# Patient Record
Sex: Female | Born: 2000 | Race: White | Hispanic: No | Marital: Single | Smoking: Never smoker
Health system: Southern US, Community
[De-identification: ages and names within clinical notes are randomized; demographics above are authoritative.]

## PROBLEM LIST (undated history)

## (undated) DIAGNOSIS — N83209 Unspecified ovarian cyst, unspecified side: Secondary | ICD-10-CM

## (undated) DIAGNOSIS — J0391 Acute recurrent tonsillitis, unspecified: Secondary | ICD-10-CM

## (undated) DIAGNOSIS — F4310 Post-traumatic stress disorder, unspecified: Secondary | ICD-10-CM

## (undated) DIAGNOSIS — F32A Depression, unspecified: Secondary | ICD-10-CM

## (undated) DIAGNOSIS — O139 Gestational [pregnancy-induced] hypertension without significant proteinuria, unspecified trimester: Secondary | ICD-10-CM

## (undated) DIAGNOSIS — R569 Unspecified convulsions: Secondary | ICD-10-CM

## (undated) DIAGNOSIS — F329 Major depressive disorder, single episode, unspecified: Secondary | ICD-10-CM

## (undated) DIAGNOSIS — F419 Anxiety disorder, unspecified: Secondary | ICD-10-CM

## (undated) HISTORY — PX: TONSILLECTOMY: SUR1361

## (undated) HISTORY — DX: Post-traumatic stress disorder, unspecified: F43.10

## (undated) HISTORY — DX: Unspecified convulsions: R56.9

## (undated) HISTORY — DX: Anxiety disorder, unspecified: F41.9

## (undated) HISTORY — DX: Depression, unspecified: F32.A

---
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
## 2018-04-14 ENCOUNTER — Encounter (HOSPITAL_BASED_OUTPATIENT_CLINIC_OR_DEPARTMENT_OTHER): Payer: Self-pay | Admitting: Emergency Medicine

## 2018-04-14 ENCOUNTER — Emergency Department (HOSPITAL_BASED_OUTPATIENT_CLINIC_OR_DEPARTMENT_OTHER)
Admission: EM | Admit: 2018-04-14 | Discharge: 2018-04-14 | Disposition: A | Discharge status: Home / Self Care | Payer: Medicaid Other | Attending: Emergency Medicine | Admitting: Emergency Medicine

## 2018-04-14 ENCOUNTER — Other Ambulatory Visit: Payer: Self-pay

## 2018-04-14 DIAGNOSIS — R102 Pelvic and perineal pain: Secondary | ICD-10-CM | POA: Diagnosis not present

## 2018-04-14 DIAGNOSIS — T7421XA Adult sexual abuse, confirmed, initial encounter: Secondary | ICD-10-CM

## 2018-04-14 DIAGNOSIS — T7621XA Adult sexual abuse, suspected, initial encounter: Secondary | ICD-10-CM | POA: Diagnosis not present

## 2018-04-14 DIAGNOSIS — R51 Headache: Secondary | ICD-10-CM | POA: Diagnosis present

## 2018-04-14 LAB — PREGNANCY, URINE: PREG TEST UR: NEGATIVE

## 2018-04-14 LAB — URINALYSIS, ROUTINE W REFLEX MICROSCOPIC
BILIRUBIN URINE: NEGATIVE
Glucose, UA: NEGATIVE mg/dL
Ketones, ur: NEGATIVE mg/dL
Nitrite: POSITIVE — AB
PH: 5.5 (ref 5.0–8.0)
Protein, ur: NEGATIVE mg/dL
SPECIFIC GRAVITY, URINE: 1.02 (ref 1.005–1.030)

## 2018-04-14 LAB — URINALYSIS, MICROSCOPIC (REFLEX)

## 2018-04-14 NOTE — Discharge Instructions (Signed)
Substance Abuse Treatment Programs ° °Intensive Outpatient Programs °[facility]     °[address]      °[place], [place]                   °[phone]      ° °[facility] °[address] °[place], [place] °[phone] ° °[facility]     °(Inpatient and outpatient)     °[address]           °[phone]   ° °[facility] °[phone] (Suboxone and Methadone) ° °[address]      °[place], [place] [address]      °[phone]      ° °[address] °[place], [place] °852-3033 ° °[facility] (Outpatient/Inpatient, Chemical)    °(insurance only) [phone]      °       °[facility] (Groups & Residential) °[place], [place] °336-389-1413 ° °   °[facility]     °[address]     °[place], [place]      °[phone]      ° °[facility] (for caregivers and family) °[address] °[place], [place] °336-299-4655 ° ° ° ° ° °Residential Treatment Programs °[facility]      °[address], [place], [place] [address]  °[phone]      ° °[facility] °[address] [place], [place] [address] °[phone] ° °[facility]        °336-248-8914      ° °[facility] °1-800-659-3381 ° °[facility]             °[address]                                         °[place], [place]                                                °877-615-2722 or 336-784-9470                              ° °[facility] [place] °[address] °[place], [address] °[phone] ° °[facility]    °[address]      °[place], [place]     °[phone]      ° °[facility] °[address] °[place], [place] °336-285-9073 ° °[facility]   °[address]     °[place], [place] [address]     °[phone]      °Admissions: 8am-3pm M-F ° °[facility] °[address] °[place],  [place] °336-227-7417 ° °[facility]: Residential Program (90 Days)   °[place], [place]      °336-725-8389 or 800-758-6077    ° °[facility] °[place], [place] °(Walk in Hours over the weekend or by referral) ° °[facility] °[address], [place],  [address] °([phone]) [phone] ° °Crisis Mobile: [facility]:  1-877-626-1772 (for crisis response 24 hours a day) °[facility] Hotline:      1-800-256-2452 °Outpatient Psychiatry and Counseling ° °[facility]: Mobile Crisis   Management 24 hours:  1-877-626-1772 ° °[facility] sliding scale fee and walk in schedule: M-F 8am-12pm/1pm-3pm °[address]  °[place], [place] [address] °[phone] ° °[facility] °[address] °[place], [place] [address] °[phone] ° °[facility] ([facility] known [facility])- new patient walk-in appointments available Monday - Friday 8am -3pm.          °[address] °[place], [place] [address] °336-676-6840 or crisis line- 336-676-6905 ° °[facility]/ Intensive Outpatient Therapy Program °[address] °[place], [place] [address] °336-832-9804 ° °[facility]                  °Crisis Services      °[phone]      °[address]     °[place], [place] [address]                ° °[facility]   °[facility] °[phone] °[address] °[place], [place] [address] ° ° °[facility]          °[address]  °[place], [place] [address]       °[phone] ° °[facility] °[address] °[place], [place] [address] °336-292-1510 ° °[facility]    °[address]    °[place], [place] [address]     °[phone]      ° °Parish McKinney, MD     °[address]     °[place] [address]     °336-282-1251     °  °[facility] °[address] °[place] [address] ° °[facility]     °[address]     °[place], [place]      °336-542-2076      ° °[facility] °Shamsher Ahluwalia, MD °[address] °[place], [place] [address] °[phone] ° °[facility]     °[address]     °[place], [place] [address]     °336-274-1237      ° °[facility] °[address] °[place], [place] [address] °336-854-4450 °Resources for Temporary Residential Assistance/Crisis Centers ° °DAY CENTERS °[facility] °M-F 8am-3pm   °[address] [place], [place] [address]   336-332-0824 °Services include: laundry, barbering, support groups, case management, phone  & computer access, showers, AA/NA mtgs, mental health/substance abuse nurse, job skills class, disability information, VA assistance, spiritual classes, etc.  ° °HOMELESS SHELTERS ° °[facility]     °[facility]   °[address], [place]     °336.271.5959       °       °[facility] (women and children)       °[address] °[place], [place] [address] °336-275-0820 °[email or web address] for application and process °Application Required ° °[facility]   °[address]    °[place] [address]     °[phone]       °             °[facility] °[address] °, [place] [address] °336.273.5572 °336-235-0363(schedule application appt.) °Application Required ° °[facility] (women only)    °[address]     °[place],  [address]     °336-884-1039      °  Intake starts 6pm daily °Need valid ID, SSC, & Police report °[facility] [place] °[address] °[place], [place] °336-881-5420 °Application Required ° °[facility] (men only)     °[address]      °[place], [place]     °[phone]      ° °[facility] °(Pregnant women only) °[address] °[place], [place] °336-275-0206 ° °[facility]      °[address]      °[place], [place] [address]     °[phone]      °       °[facility] [place] [facility] °[address] °[place], [place] °336-723-1848 °90 day commitment/SA/Application process ° °[facility](men only)     °[address]     °[place], [place]     °[phone]       °Check-in at 7pm     °       °[facility] °[address] °[place], [place] [address] °336-248-6684 °Men/Women/Women and Children must be there by 7 pm ° °[facility] °[place], [place] °336-722-8721                ° °
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
## 2018-04-14 NOTE — ED Notes (Signed)
MD with pt  
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
## 2018-04-16 LAB — URINE CULTURE

## 2018-04-16 LAB — GC/CHLAMYDIA PROBE AMP (~~LOC~~) NOT AT ARMC
Chlamydia: POSITIVE — AB
NEISSERIA GONORRHEA: NEGATIVE

## 2018-04-17 ENCOUNTER — Emergency Department (HOSPITAL_BASED_OUTPATIENT_CLINIC_OR_DEPARTMENT_OTHER)
Admission: EM | Admit: 2018-04-17 | Discharge: 2018-04-17 | Disposition: A | Discharge status: Home / Self Care | Payer: Medicaid Other | Attending: Emergency Medicine | Admitting: Emergency Medicine

## 2018-04-17 ENCOUNTER — Other Ambulatory Visit: Payer: Self-pay

## 2018-04-17 ENCOUNTER — Telehealth: Payer: Self-pay | Admitting: Emergency Medicine

## 2018-04-17 ENCOUNTER — Encounter (HOSPITAL_BASED_OUTPATIENT_CLINIC_OR_DEPARTMENT_OTHER): Payer: Self-pay

## 2018-04-17 DIAGNOSIS — A749 Chlamydial infection, unspecified: Secondary | ICD-10-CM | POA: Diagnosis present

## 2018-04-17 DIAGNOSIS — Z79899 Other long term (current) drug therapy: Secondary | ICD-10-CM | POA: Insufficient documentation

## 2018-04-17 DIAGNOSIS — F909 Attention-deficit hyperactivity disorder, unspecified type: Secondary | ICD-10-CM | POA: Diagnosis not present

## 2018-04-17 HISTORY — DX: Unspecified ovarian cyst, unspecified side: N83.209

## 2018-04-17 LAB — WET PREP, GENITAL
Clue Cells Wet Prep HPF POC: NONE SEEN
SPERM: NONE SEEN
TRICH WET PREP: NONE SEEN
YEAST WET PREP: NONE SEEN

## 2018-04-17 LAB — PREGNANCY, URINE: PREG TEST UR: NEGATIVE

## 2018-04-17 MED ORDER — AZITHROMYCIN 250 MG PO TABS
1000.0000 mg | ORAL_TABLET | Freq: Once | ORAL | Status: AC
  Administered 2018-04-17: 1000 mg via ORAL
  Filled 2018-04-17: qty 4

## 2018-04-17 MED ORDER — CEFTRIAXONE SODIUM 250 MG IJ SOLR
250.0000 mg | Freq: Once | INTRAMUSCULAR | Status: AC
  Administered 2018-04-17: 250 mg via INTRAMUSCULAR
  Filled 2018-04-17: qty 250

## 2018-04-17 MED ORDER — LIDOCAINE HCL (PF) 1 % IJ SOLN
INTRAMUSCULAR | Status: AC
  Administered 2018-04-17: 0.9 mL
  Filled 2018-04-17: qty 5

## 2018-04-17 NOTE — ED Triage Notes (Signed)
Pt states she called for f/u to STD screening and advised she was pos for chlamydia-NAD-steady gait
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
## 2018-04-17 NOTE — Discharge Instructions (Signed)
You have been treated today for an STD.   The test results with take 2-3 days to return. If there is an abnormal result, you will be notified. If you do not hear anything, that means the results were negative. You can also log on MyChart to see the results.    Do not have sexual intercourse for the next 7 days.  Follow-up with your primary care doctor in 2-4 days. If you do not have a primary care doctor, you can use one listed in the paperwork.   Return to the Emergency Department for any fever, abdominal pain, difficulty breathing, nausea/vomiting or any other worsening or concerning symptoms.

## 2018-04-17 NOTE — Telephone Encounter (Signed)
Post ED Visit - Positive Culture Follow-up  Culture report reviewed by antimicrobial stewardship pharmacist:  []  Enzo Bi, Pharm.D. []  Celedonio Miyamoto, Pharm.D., BCPS AQ-ID []  Garvin Fila, Pharm.D., BCPS []  Georgina Pillion, Pharm.D., BCPS []  Bar [place]unn, [address].D., BCPS, AAHIVP []  Estella Husk, Pharm.D., BCPS, AAHIVP []  Lysle Pearl, PharmD, BCPS []  Phillips Climes, PharmD, BCPS []  Agapito Games, PharmD, BCPS []  Verlan Friends, PharmD  Positive urine culture Treated with none, asymptomatic, no further patient follow-up is required at this time.  Berle Mull 04/17/2018, 10:56 AM

## 2018-04-17 NOTE — ED Notes (Signed)
Pt endorsed pain with pelvic exam, has vaginal discharge. Pt has scratches to left inner thigh and bilateral knees.

## 2018-05-05 ENCOUNTER — Encounter (HOSPITAL_BASED_OUTPATIENT_CLINIC_OR_DEPARTMENT_OTHER): Payer: Self-pay | Admitting: *Deleted

## 2018-05-05 ENCOUNTER — Emergency Department (HOSPITAL_BASED_OUTPATIENT_CLINIC_OR_DEPARTMENT_OTHER)
Admission: EM | Admit: 2018-05-05 | Discharge: 2018-05-06 | Disposition: A | Discharge status: Home / Self Care | Payer: Medicaid Other | Attending: Emergency Medicine | Admitting: Emergency Medicine

## 2018-05-05 ENCOUNTER — Other Ambulatory Visit: Payer: Self-pay

## 2018-05-05 DIAGNOSIS — K529 Noninfective gastroenteritis and colitis, unspecified: Secondary | ICD-10-CM

## 2018-05-05 DIAGNOSIS — R509 Fever, unspecified: Secondary | ICD-10-CM | POA: Diagnosis present

## 2018-05-05 LAB — COMPREHENSIVE METABOLIC PANEL
ALBUMIN: 4.5 g/dL (ref 3.5–5.0)
ALT: 12 U/L (ref 0–44)
ANION GAP: 8 (ref 5–15)
AST: 21 U/L (ref 15–41)
Alkaline Phosphatase: 60 U/L (ref 38–126)
BILIRUBIN TOTAL: 1 mg/dL (ref 0.3–1.2)
BUN: 15 mg/dL (ref 6–20)
CHLORIDE: 107 mmol/L (ref 98–111)
CO2: 26 mmol/L (ref 22–32)
Calcium: 9.2 mg/dL (ref 8.9–10.3)
Creatinine, Ser: 0.73 mg/dL (ref 0.44–1.00)
GFR calc Af Amer: >60 mL/min (ref >60)
GLUCOSE: 104 mg/dL — AB (ref 70–99)
Potassium: 3.6 mmol/L (ref 3.5–5.1)
Sodium: 141 mmol/L (ref 135–145)
TOTAL PROTEIN: 7.3 g/dL (ref 6.5–8.1)

## 2018-05-05 LAB — LIPASE, BLOOD: LIPASE: 27 U/L (ref 11–51)

## 2018-05-05 LAB — URINALYSIS, ROUTINE W REFLEX MICROSCOPIC
GLUCOSE, UA: NEGATIVE mg/dL
Ketones, ur: NEGATIVE mg/dL
NITRITE: NEGATIVE
Protein, ur: NEGATIVE mg/dL
pH: 5.5 (ref 5.0–8.0)

## 2018-05-05 LAB — PREGNANCY, URINE: Preg Test, Ur: NEGATIVE

## 2018-05-05 LAB — CBC
HCT: 37.1 % (ref 36.0–46.0)
HEMOGLOBIN: 12.6 g/dL (ref 12.0–15.0)
MCH: 27.3 pg (ref 26.0–34.0)
MCHC: 34 g/dL (ref 30.0–36.0)
MCV: 80.3 fL (ref 78.0–100.0)
Platelets: 184 10*3/uL (ref 150–400)
RBC: 4.62 MIL/uL (ref 3.87–5.11)
RDW: 13.7 % (ref 11.5–15.5)
WBC: 5.8 10*3/uL (ref 4.0–10.5)

## 2018-05-05 LAB — URINALYSIS, MICROSCOPIC (REFLEX)

## 2018-05-05 MED ORDER — SODIUM CHLORIDE 0.9 % IV BOLUS
1000.0000 mL | Freq: Once | INTRAVENOUS | Status: AC
  Administered 2018-05-05: 1000 mL via INTRAVENOUS

## 2018-05-05 MED ORDER — KETOROLAC TROMETHAMINE 30 MG/ML IJ SOLN
30.0000 mg | Freq: Once | INTRAMUSCULAR | Status: AC
  Administered 2018-05-05: 30 mg via INTRAVENOUS
  Filled 2018-05-05: qty 1

## 2018-05-05 MED ORDER — ONDANSETRON HCL 4 MG/2ML IJ SOLN
4.0000 mg | Freq: Once | INTRAMUSCULAR | Status: AC
  Administered 2018-05-05: 4 mg via INTRAVENOUS
  Filled 2018-05-05: qty 2

## 2018-05-05 NOTE — ED Notes (Signed)
Pt took 325mg  tylenol PTA.

## 2018-05-05 NOTE — ED Triage Notes (Signed)
Pt reports she has had fever since Friday and started vomiting today

## 2018-05-05 NOTE — ED Provider Notes (Addendum)
[facility] EMERGENCY DEPARTMENT Provider Note   CSN: [id number] Arrival date & time: 05/05/18  2138     History   Chief Complaint Chief Complaint  Patient presents with  . Fever    HPI Angle Dirusso is a 17 y.o. female.  Patient is an 17 year old female with no significant past medical history.  She presents for evaluation of fever for the past 2 days, then diarrhea, nausea, and vomiting that started today.  All has been nonbloody and non-bilious.  She reports epigastric cramping and also states that her urine has been cloudy.  He denies any ill contacts.  The history is provided by the patient.  Fever   This is a new problem. The current episode started 2 days ago. The problem occurs constantly. The problem has been gradually worsening. Associated symptoms include diarrhea and vomiting. She has tried nothing for the symptoms.    Past Medical History:  Diagnosis Date  . Ovarian cyst   . Recurrent tonsillitis 09/2017   current antibiotic, will finish 09/25/2017    Patient Active Problem List   Diagnosis Date Noted  . History of dental surgery-wisdom teeth extraction 01/03/2015  . Facial abscess 01/02/2015  . Trismus   . Allergic rhinitis, seasonal 07/28/2014    Past Surgical History:  Procedure Laterality Date  . TONSILLECTOMY N/A 10/01/2017   Procedure: TONSILLECTOMY;  Surgeon: Serena Colonel, MD;  Location: [facility];  Service: ENT;  Laterality: N/A;  . WISDOM TOOTH EXTRACTION  10/2014     OB History   None      Home Medications    Prior to Admission medications   Medication Sig Start Date End Date Taking? Authorizing Provider  Norethindrone Acet-Ethinyl Est (JUNEL 1.5/30 PO) Take 1 tablet by mouth daily.    Yes [provider]    Family History Family History  Problem Relation Age of Onset  . Cancer Mother   . Cancer Maternal Aunt   . Cancer Maternal Uncle   . Cancer Maternal Grandmother   . Stroke Maternal  Grandmother   . Diabetes Maternal Grandmother     Social History Social History   Tobacco Use  . Smoking status: Never Smoker  . Smokeless tobacco: Never Used  Substance Use Topics  . Alcohol use: No  . Drug use: No     Allergies   Dexamethasone and Ciprofloxacin   Review of Systems Review of Systems  Constitutional: Positive for fever.  Gastrointestinal: Positive for diarrhea and vomiting.  All other systems reviewed and are negative.    Physical Exam Updated Vital Signs BP 122/63 (BP Location: Left Arm)   Pulse 91   Temp 98.7 F (37.1 C) (Oral)   Resp 16   Ht 5\' 2"  (1.575 m)   Wt 63 kg   LMP 04/08/2018   SpO2 100%   BMI 25.40 kg/m   Physical Exam  Constitutional: She is oriented to person, place, and time. She appears well-developed and well-nourished. No distress.  HENT:  Head: Normocephalic and atraumatic.  Mouth/Throat: Oropharynx is clear and moist.  Neck: Normal range of motion. Neck supple.  Cardiovascular: Normal rate and regular rhythm. Exam reveals no gallop and no friction rub.  No murmur heard. Pulmonary/Chest: Effort normal and breath sounds normal. No respiratory distress. She has no wheezes.  Abdominal: Soft. Bowel sounds are normal. She exhibits no distension. There is tenderness. There is no rebound and no guarding.  There is mild tenderness in the epigastric region.  Musculoskeletal:  Normal range of motion.  Neurological: She is alert and oriented to person, place, and time.  Skin: Skin is warm and dry. She is not diaphoretic.  Nursing note and vitals reviewed.    ED Treatments / Results  Labs (all labs ordered are listed, but only abnormal results are displayed) Labs Reviewed  COMPREHENSIVE METABOLIC PANEL - Abnormal; Notable for the following components:      Result Value   Glucose, Bld 104 (*)    All other components within normal limits  URINALYSIS, ROUTINE W REFLEX MICROSCOPIC - Abnormal; Notable for the following components:    APPearance CLOUDY (*)    Specific Gravity, Urine >1.030 (*)    Hgb urine dipstick TRACE (*)    Bilirubin Urine SMALL (*)    Leukocytes, UA TRACE (*)    All other components within normal limits  URINALYSIS, MICROSCOPIC (REFLEX) - Abnormal; Notable for the following components:   Bacteria, UA MANY (*)    All other components within normal limits  LIPASE, BLOOD  CBC  PREGNANCY, URINE    EKG EKG Interpretation  Date/Time:  Sunday May 05 2018 22:07:32 EDT Ventricular Rate:  95 PR Interval:  128 QRS Duration: 88 QT Interval:  322 QTC Calculation: 405 R Axis:   70 Text Interpretation:  Sinus rhythm Nonspecific T wave abnormality in V2 and V3 Confirmed by Eber HongSpector, Z[place]ulon [phone]) on 05/06/2018 3:53:24 PM   Radiology No results found.  Procedures Procedures (including critical care time)  Medications Ordered in ED Medications  sodium chloride 0.9 % bolus 1,000 mL (has no administration in time range)  ondansetron (ZOFRAN) injection 4 mg (has no administration in time range)  ketorolac (TORADOL) 30 MG/ML injection 30 mg (has no administration in time range)     Initial Impression / Assessment and Plan / ED Course  I have reviewed the triage vital signs and the nursing notes.  Pertinent labs & imaging results that were available during my care of the patient were reviewed by me and considered in my medical decision making (see chart for details).  Patient symptoms likely viral in nature.  She will be discharged with Zofran and is to follow-up as needed.  Final Clinical Impressions(s) / ED Diagnoses   Final diagnoses:  None    ED Discharge Orders    None       Geoffery Lyonselo, Jacalynn Buzzell, MD 05/06/18 [id number]    Geoffery Lyonselo, Hafiz Irion, MD 05/24/18 [phone]

## 2018-05-06 MED ORDER — ONDANSETRON 8 MG PO TBDP: ORAL_TABLET | ORAL | 0 refills | Status: DC

## 2018-05-06 NOTE — Discharge Instructions (Addendum)
Zofran as prescribed as needed for nausea.  There are liquid diet as tolerated for the next 12 hours, then gradually advance as tolerated.  Return to the emergency department if symptoms significantly worsen or change.

## 2018-06-16 ENCOUNTER — Emergency Department (HOSPITAL_COMMUNITY): Payer: Medicaid Other

## 2018-06-16 ENCOUNTER — Encounter (HOSPITAL_COMMUNITY): Payer: Self-pay

## 2018-06-16 ENCOUNTER — Other Ambulatory Visit: Payer: Self-pay

## 2018-06-16 ENCOUNTER — Emergency Department (HOSPITAL_COMMUNITY)
Admission: EM | Admit: 2018-06-16 | Discharge: 2018-06-16 | Disposition: A | Discharge status: Home / Self Care | Payer: Medicaid Other | Attending: Emergency Medicine | Admitting: Emergency Medicine

## 2018-06-16 DIAGNOSIS — Z8742 Personal history of other diseases of the female genital tract: Secondary | ICD-10-CM | POA: Insufficient documentation

## 2018-06-16 DIAGNOSIS — R1084 Generalized abdominal pain: Secondary | ICD-10-CM

## 2018-06-16 DIAGNOSIS — R109 Unspecified abdominal pain: Secondary | ICD-10-CM | POA: Diagnosis present

## 2018-06-16 LAB — COMPREHENSIVE METABOLIC PANEL
ALT: 15 U/L (ref 0–44)
ANION GAP: 10 (ref 5–15)
AST: 16 U/L (ref 15–41)
Albumin: 4.9 g/dL (ref 3.5–5.0)
Alkaline Phosphatase: 63 U/L (ref 38–126)
BUN: 11 mg/dL (ref 6–20)
CHLORIDE: 106 mmol/L (ref 98–111)
CO2: 25 mmol/L (ref 22–32)
CREATININE: 0.68 mg/dL (ref 0.44–1.00)
Calcium: 9.4 mg/dL (ref 8.9–10.3)
Glucose, Bld: 85 mg/dL (ref 70–99)
POTASSIUM: 3.7 mmol/L (ref 3.5–5.1)
Sodium: 141 mmol/L (ref 135–145)
Total Bilirubin: 1.2 mg/dL (ref 0.3–1.2)
Total Protein: 7.9 g/dL (ref 6.5–8.1)

## 2018-06-16 LAB — CBC WITH DIFFERENTIAL/PLATELET
ABS IMMATURE GRANULOCYTES: 0.02 10*3/uL (ref 0.00–0.07)
BASOS ABS: 0 10*3/uL (ref 0.0–0.1)
BASOS PCT: 1 %
Eosinophils Absolute: 0.1 10*3/uL (ref 0.0–0.5)
Eosinophils Relative: 2 %
HCT: 42 % (ref 36.0–46.0)
Hemoglobin: 13.4 g/dL (ref 12.0–15.0)
Immature Granulocytes: 0 %
Lymphocytes Relative: 35 %
Lymphs Abs: 2.4 10*3/uL (ref 0.7–4.0)
MCH: 26.3 pg (ref 26.0–34.0)
MCHC: 31.9 g/dL (ref 30.0–36.0)
MCV: 82.5 fL (ref 80.0–100.0)
MONO ABS: 0.6 10*3/uL (ref 0.1–1.0)
Monocytes Relative: 8 %
NRBC: 0 % (ref 0.0–0.2)
Neutro Abs: 3.8 10*3/uL (ref 1.7–7.7)
Neutrophils Relative %: 54 %
PLATELETS: 258 10*3/uL (ref 150–400)
RBC: 5.09 MIL/uL (ref 3.87–5.11)
RDW: 14.1 % (ref 11.5–15.5)
WBC: 7 10*3/uL (ref 4.0–10.5)

## 2018-06-16 LAB — I-STAT BETA HCG BLOOD, ED (MC, WL, AP ONLY): I-stat hCG, quantitative: <5 m[IU]/mL (ref <5)

## 2018-06-16 LAB — LIPASE, BLOOD: LIPASE: 28 U/L (ref 11–51)

## 2018-06-16 MED ORDER — SODIUM CHLORIDE 0.9 % IV BOLUS
1000.0000 mL | Freq: Once | INTRAVENOUS | Status: AC
  Administered 2018-06-16: 1000 mL via INTRAVENOUS

## 2018-06-16 MED ORDER — ONDANSETRON HCL 4 MG/2ML IJ SOLN
4.0000 mg | Freq: Once | INTRAMUSCULAR | Status: AC
  Administered 2018-06-16: 4 mg via INTRAVENOUS
  Filled 2018-06-16: qty 2

## 2018-06-16 MED ORDER — MORPHINE SULFATE (PF) 4 MG/ML IV SOLN
4.0000 mg | Freq: Once | INTRAVENOUS | Status: AC
  Administered 2018-06-16: 4 mg via INTRAVENOUS
  Filled 2018-06-16: qty 1

## 2018-06-16 MED ORDER — IOPAMIDOL (ISOVUE-300) INJECTION 61%
INTRAVENOUS | Status: AC
  Filled 2018-06-16: qty 100

## 2018-06-16 MED ORDER — SODIUM CHLORIDE 0.9 % IJ SOLN
INTRAMUSCULAR | Status: AC
  Filled 2018-06-16: qty 50

## 2018-06-16 MED ORDER — IOPAMIDOL (ISOVUE-300) INJECTION 61%
100.0000 mL | Freq: Once | INTRAVENOUS | Status: AC | PRN
  Administered 2018-06-16: 100 mL via INTRAVENOUS

## 2018-06-16 NOTE — Discharge Instructions (Addendum)
Please take Ibuprofen (Advil, motrin) and Tylenol (acetaminophen) to relieve your pain.  You may take up to 600 MG (3 pills) of normal strength ibuprofen every 8 hours as needed.  In between doses of ibuprofen you make take tylenol, up to 1,000 mg (two extra strength pills).  Do not take more than 3,000 mg tylenol in a 24 hour period.  Please check all medication labels as many medications such as pain and cold medications may contain tylenol.  Do not drink alcohol while taking these medications.  Do not take other NSAID'S while taking ibuprofen (such as aleve or naproxen).  Please take ibuprofen with food to decrease stomach upset. ° °Today you received medications that may make you sleepy or impair your ability to make decisions.  For the next 24 hours please do not drive, operate heavy machinery, care for a small child with out another adult present, or perform any activities that may cause harm to you or someone else if you were to fall asleep or be impaired.  ° ° °

## 2018-06-16 NOTE — ED Notes (Signed)
Patient has extra blood in the lab one light green, one lavender, 2 golds

## 2018-06-16 NOTE — ED Notes (Signed)
Patient transported to CT 

## 2018-06-16 NOTE — ED Triage Notes (Signed)
PT reports "Im  having sharp pain 7/10 in her ovaries again" Pt reports that she was to have cysts removed in her ovaries in June but her mother did not take.

## 2018-06-16 NOTE — ED Notes (Signed)
ED Provider at bedside. 

## 2018-06-16 NOTE — ED Notes (Signed)
US at bedside

## 2018-06-16 NOTE — ED Provider Notes (Signed)
[facility]-EMERGENCY DEPT Provider Note   CSN: [id number] Arrival date & time: 06/16/18  1620     History   Chief Complaint Chief Complaint  Patient presents with  . Pelvic Pain    HPI Kristie Crawford is a 17 y.o. female with a past medical history of ovarian cyst who presents today for evaluation of abdominal pain.  She reports that over the past few days she has been having pain in her ovaries.  She says that she had a visit with her primary care doctor recently where she had STD testing and pelvic exam performed which was negative.  She declines pelvic exam at this time.  HPI  Past Medical History:  Diagnosis Date  . Ovarian cyst   . Recurrent tonsillitis 09/2017   current antibiotic, will finish 09/25/2017    Patient Active Problem List   Diagnosis Date Noted  . History of dental surgery-wisdom teeth extraction 01/03/2015  . Facial abscess 01/02/2015  . Trismus   . Allergic rhinitis, seasonal 07/28/2014    Past Surgical History:  Procedure Laterality Date  . TONSILLECTOMY N/A 10/01/2017   Procedure: TONSILLECTOMY;  Surgeon: Serena Colonel, MD;  Location: [facility];  Service: ENT;  Laterality: N/A;  . WISDOM TOOTH EXTRACTION  10/2014     OB History   None      Home Medications    Prior to Admission medications   Medication Sig Start Date End Date Taking? Authorizing Provider  acetaminophen (TYLENOL) 500 MG tablet Take 500 mg by mouth every 6 (six) hours as needed for mild pain.   Yes [provider]  ondansetron (ZOFRAN ODT) 8 MG disintegrating tablet 8mg  ODT q4 hours prn nausea Patient not taking: Reported on 06/16/2018 05/06/18   Geoffery Lyons, MD    Family History Family History  Problem Relation Age of Onset  . Cancer Mother   . Cancer Maternal Aunt   . Cancer Maternal Uncle   . Cancer Maternal Grandmother   . Stroke Maternal Grandmother   . Diabetes Maternal Grandmother     Social History Social  History   Tobacco Use  . Smoking status: Never Smoker  . Smokeless tobacco: Never Used  Substance Use Topics  . Alcohol use: No  . Drug use: No     Allergies   Dexamethasone and Ciprofloxacin   Review of Systems Review of Systems  Constitutional: Negative for chills and fever.  Respiratory: Negative for chest tightness and shortness of breath.   Gastrointestinal: Positive for abdominal pain and nausea. Negative for diarrhea and vomiting.  Genitourinary: Positive for pelvic pain. Negative for dysuria, frequency, hematuria, urgency, vaginal bleeding, vaginal discharge and vaginal pain.  Musculoskeletal: Negative for back pain and neck pain.  All other systems reviewed and are negative.    Physical Exam Updated Vital Signs BP 111/68 (BP Location: Right Arm)   Pulse 70   Temp 98.9 F (37.2 C) (Oral)   Resp 18   Ht 5\' 2"  (1.575 m)   Wt 63.5 kg   LMP 03/26/2018   SpO2 100%   BMI 25.61 kg/m   Physical Exam  Constitutional: She appears well-developed and well-nourished. No distress.  HENT:  Head: Normocephalic and atraumatic.  Mouth/Throat: Oropharynx is clear and moist.  Eyes: Conjunctivae are normal.  Neck: Normal range of motion. Neck supple.  Cardiovascular: Normal rate, regular rhythm and normal heart sounds.  No murmur heard. Pulmonary/Chest: Effort normal and breath sounds normal. No respiratory distress.  Abdominal: Soft.  Bowel sounds are normal. She exhibits no distension. There is tenderness in the right lower quadrant, suprapubic area and left lower quadrant. There is no guarding.  Genitourinary:  Genitourinary Comments: Patient refused  Musculoskeletal: She exhibits no edema.  Neurological: She is alert.  Skin: Skin is warm and dry.  Psychiatric: She has a normal mood and affect. Her behavior is normal.  Nursing note and vitals reviewed.    ED Treatments / Results  Labs (all labs ordered are listed, but only abnormal results are displayed) Labs  Reviewed  COMPREHENSIVE METABOLIC PANEL  LIPASE, BLOOD  CBC WITH DIFFERENTIAL/PLATELET  I-STAT BETA HCG BLOOD, ED ([facility], [facility], [facility] ONLY)    EKG None  Radiology US Transvaginal Non-ob  Result Date: 06/16/2018 CLINICAL DATA:  Left pelvic pain.  History of ovarian cysts. EXAM: TRANSABDOMINAL AND TRANSVAGINAL ULTRASOUND OF PELVIS DOPPLER ULTRASOUND OF OVARIES TECHNIQUE: Both transabdominal and transvaginal ultrasound examinations of the pelvis were performed. Transabdominal technique was performed for global imaging of the pelvis including uterus, ovaries, adnexal regions, and pelvic cul-de-sac. It was necessary to proceed with endovaginal exam following the transabdominal exam to visualize the endometrium and ovaries. Color and duplex Doppler ultrasound was utilized to evaluate blood flow to the ovaries. COMPARISON:  None. FINDINGS: Uterus Measurements: 6.8 x 2.9 x 4.0 cm = volume: 41 mL. No fibroids or other mass visualized. Endometrium Thickness: 6 mm.  No focal abnormality visualized. Right ovary Measurements: 3.5 x 2.6 x 2.5 cm = volume: 12 mL. Normal appearance/no adnexal mass. Left ovary Measurements: 3.2 x 2.3 x 2.8 cm = volume: 11 mL. Normal appearance/no adnexal mass. Pulsed Doppler evaluation of both ovaries demonstrates normal low-resistance arterial and venous waveforms. Other findings Trace free fluid in the pelvis. IMPRESSION: Negative pelvic ultrasound.  No evidence of ovarian torsion. Electronically Signed   By: Sebastian Ache M.D.   On: 06/16/2018 19:24   US Pelvis Complete  Result Date: 06/16/2018 CLINICAL DATA:  Left pelvic pain.  History of ovarian cysts. EXAM: TRANSABDOMINAL AND TRANSVAGINAL ULTRASOUND OF PELVIS DOPPLER ULTRASOUND OF OVARIES TECHNIQUE: Both transabdominal and transvaginal ultrasound examinations of the pelvis were performed. Transabdominal technique was performed for global imaging of the pelvis including uterus, ovaries, adnexal regions, and pelvic cul-de-sac. It was  necessary to proceed with endovaginal exam following the transabdominal exam to visualize the endometrium and ovaries. Color and duplex Doppler ultrasound was utilized to evaluate blood flow to the ovaries. COMPARISON:  None. FINDINGS: Uterus Measurements: 6.8 x 2.9 x 4.0 cm = volume: 41 mL. No fibroids or other mass visualized. Endometrium Thickness: 6 mm.  No focal abnormality visualized. Right ovary Measurements: 3.5 x 2.6 x 2.5 cm = volume: 12 mL. Normal appearance/no adnexal mass. Left ovary Measurements: 3.2 x 2.3 x 2.8 cm = volume: 11 mL. Normal appearance/no adnexal mass. Pulsed Doppler evaluation of both ovaries demonstrates normal low-resistance arterial and venous waveforms. Other findings Trace free fluid in the pelvis. IMPRESSION: Negative pelvic ultrasound.  No evidence of ovarian torsion. Electronically Signed   By: Sebastian Ache M.D.   On: 06/16/2018 19:24   Ct Abdomen Pelvis W Contrast  Result Date: 06/16/2018 CLINICAL DATA:  Sharp pain in the ovaries. History of ovarian cyst. Bilateral lower quadrant pain, right worse than left. EXAM: CT ABDOMEN AND PELVIS WITH CONTRAST TECHNIQUE: Multidetector CT imaging of the abdomen and pelvis was performed using the standard protocol following bolus administration of intravenous contrast. CONTRAST:  100 mL Isovue-300 COMPARISON:  Ultrasound pelvis 06/16/2018 FINDINGS: Lower chest: Mild dependent  changes in the lung bases. Hepatobiliary: No focal liver abnormality is seen. No gallstones, gallbladder wall thickening, or biliary dilatation. Pancreas: Unremarkable. No pancreatic ductal dilatation or surrounding inflammatory changes. Spleen: Normal in size without focal abnormality. Adrenals/Urinary Tract: Adrenal glands are unremarkable. Kidneys are normal, without renal calculi, focal lesion, or hydronephrosis. Bladder is unremarkable. Stomach/Bowel: Stomach is within normal limits. Appendix appears normal. No evidence of bowel wall thickening, distention, or  inflammatory changes. Vascular/Lymphatic: No significant vascular findings are present. No enlarged abdominal or pelvic lymph nodes. Reproductive: Uterus and bilateral adnexa are unremarkable. Other: No abdominal wall hernia or abnormality. No abdominopelvic ascites. Musculoskeletal: No acute or significant osseous findings. IMPRESSION: No acute process demonstrated in the abdomen or pelvis. No evidence of bowel obstruction or inflammation. Appendix is normal. Electronically Signed   By: Burman Nieves M.D.   On: 06/16/2018 21:14   [place] Art/ven Flow Abd Pelv Doppler  Result Date: 06/16/2018 CLINICAL DATA:  Left pelvic pain.  History of ovarian cysts. EXAM: TRANSABDOMINAL AND TRANSVAGINAL ULTRASOUND OF PELVIS DOPPLER ULTRASOUND OF OVARIES TECHNIQUE: Both transabdominal and transvaginal ultrasound examinations of the pelvis were performed. Transabdominal technique was performed for global imaging of the pelvis including uterus, ovaries, adnexal regions, and pelvic cul-de-sac. It was necessary to proceed with endovaginal exam following the transabdominal exam to visualize the endometrium and ovaries. Color and duplex Doppler ultrasound was utilized to evaluate blood flow to the ovaries. COMPARISON:  None. FINDINGS: Uterus Measurements: 6.8 x 2.9 x 4.0 cm = volume: 41 mL. No fibroids or other mass visualized. Endometrium Thickness: 6 mm.  No focal abnormality visualized. Right ovary Measurements: 3.5 x 2.6 x 2.5 cm = volume: 12 mL. Normal appearance/no adnexal mass. Left ovary Measurements: 3.2 x 2.3 x 2.8 cm = volume: 11 mL. Normal appearance/no adnexal mass. Pulsed Doppler evaluation of both ovaries demonstrates normal low-resistance arterial and venous waveforms. Other findings Trace free fluid in the pelvis. IMPRESSION: Negative pelvic ultrasound.  No evidence of ovarian torsion. Electronically Signed   By: Sebastian Ache M.D.   On: 06/16/2018 19:24    Procedures Procedures (including critical care  time)  Medications Ordered in ED Medications  sodium chloride 0.9 % injection (has no administration in time range)  morphine 4 MG/ML injection 4 mg (4 mg Intravenous Given 06/16/18 1926)  ondansetron (ZOFRAN) injection 4 mg (4 mg Intravenous Given 06/16/18 1925)  sodium chloride 0.9 % bolus 1,000 mL (0 mLs Intravenous Stopped 06/16/18 2038)  iopamidol (ISOVUE-300) 61 % injection 100 mL (100 mLs Intravenous Contrast Given 06/16/18 2034)     Initial Impression / Assessment and Plan / ED Course  I have reviewed the triage vital signs and the nursing notes.  Pertinent labs & imaging results that were available during my care of the patient were reviewed by me and considered in my medical decision making (see chart for details).    Patient presents today for evaluation of abdominal/pelvic pain.  She has a history of ovarian cyst.  Ultrasound was obtained and reviewed does not show any cysts on her ovaries.  She had tenderness in the bilateral lower abdominal quadrants, right greater than left.  CT scan abdomen pelvis was obtained which did not show any cause for her symptoms or other abnormalities.  She stated she did not need a UA, and refused pelvic exam.  Labs were obtained and reviewed without evidence of acute abnormalities.  She is hemodynamically stable while in the department.  Return precautions were discussed with patient who states their  understanding.  At the time of discharge patient denied any unaddressed complaints or concerns.  Patient is agreeable for discharge home.   Final Clinical Impressions(s) / ED Diagnoses   Final diagnoses:  Generalized abdominal pain    ED Discharge Orders    None       Norman Clay 06/17/18 0055    Terrilee Files, MD 06/17/18 [phone]

## 2018-06-29 ENCOUNTER — Other Ambulatory Visit: Payer: Self-pay

## 2018-06-29 ENCOUNTER — Encounter (HOSPITAL_BASED_OUTPATIENT_CLINIC_OR_DEPARTMENT_OTHER): Payer: Self-pay | Admitting: Emergency Medicine

## 2018-06-29 ENCOUNTER — Emergency Department (HOSPITAL_BASED_OUTPATIENT_CLINIC_OR_DEPARTMENT_OTHER): Payer: Medicaid Other

## 2018-06-29 ENCOUNTER — Emergency Department (HOSPITAL_BASED_OUTPATIENT_CLINIC_OR_DEPARTMENT_OTHER)
Admission: EM | Admit: 2018-06-29 | Discharge: 2018-06-29 | Disposition: A | Discharge status: Home / Self Care | Payer: Medicaid Other | Attending: Emergency Medicine | Admitting: Emergency Medicine

## 2018-06-29 DIAGNOSIS — Y939 Activity, unspecified: Secondary | ICD-10-CM | POA: Insufficient documentation

## 2018-06-29 DIAGNOSIS — S59902A Unspecified injury of left elbow, initial encounter: Secondary | ICD-10-CM | POA: Diagnosis present

## 2018-06-29 DIAGNOSIS — Y998 Other external cause status: Secondary | ICD-10-CM | POA: Insufficient documentation

## 2018-06-29 DIAGNOSIS — Y929 Unspecified place or not applicable: Secondary | ICD-10-CM | POA: Insufficient documentation

## 2018-06-29 DIAGNOSIS — W19XXXA Unspecified fall, initial encounter: Secondary | ICD-10-CM | POA: Insufficient documentation

## 2018-06-29 DIAGNOSIS — S5002XA Contusion of left elbow, initial encounter: Secondary | ICD-10-CM
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
## 2018-06-29 NOTE — ED Triage Notes (Signed)
Patient states that she is having pain to her right elbow after a fall  - the patient reports that she had numbness and tingling

## 2018-08-16 ENCOUNTER — Inpatient Hospital Stay (HOSPITAL_COMMUNITY)
Admission: AD | Admit: 2018-08-16 | Discharge: 2018-08-16 | Disposition: A | Discharge status: Home / Self Care | Payer: Medicaid Other | Source: Ambulatory Visit | Attending: Obstetrics and Gynecology | Admitting: Obstetrics and Gynecology

## 2018-08-16 ENCOUNTER — Encounter (HOSPITAL_COMMUNITY): Payer: Self-pay | Admitting: *Deleted

## 2018-08-16 ENCOUNTER — Other Ambulatory Visit: Payer: Self-pay

## 2018-08-16 DIAGNOSIS — N898 Other specified noninflammatory disorders of vagina: Secondary | ICD-10-CM

## 2018-08-16 DIAGNOSIS — R109 Unspecified abdominal pain: Secondary | ICD-10-CM | POA: Diagnosis present

## 2018-08-16 DIAGNOSIS — Z3202 Encounter for pregnancy test, result negative: Secondary | ICD-10-CM | POA: Diagnosis not present

## 2018-08-16 DIAGNOSIS — N3001 Acute cystitis with hematuria: Secondary | ICD-10-CM | POA: Insufficient documentation

## 2018-08-16 LAB — CBC
HEMATOCRIT: 37.7 % (ref 36.0–46.0)
Hemoglobin: 12.5 g/dL (ref 12.0–15.0)
MCH: 27.5 pg (ref 26.0–34.0)
MCHC: 33.2 g/dL (ref 30.0–36.0)
MCV: 83 fL (ref 80.0–100.0)
Platelets: 192 10*3/uL (ref 150–400)
RBC: 4.54 MIL/uL (ref 3.87–5.11)
RDW: 15.2 % (ref 11.5–15.5)
WBC: 6.7 10*3/uL (ref 4.0–10.5)
nRBC: 0 % (ref 0.0–0.2)

## 2018-08-16 LAB — URINALYSIS, ROUTINE W REFLEX MICROSCOPIC
BILIRUBIN URINE: NEGATIVE
Glucose, UA: NEGATIVE mg/dL
HGB URINE DIPSTICK: NEGATIVE
KETONES UR: NEGATIVE mg/dL
NITRITE: POSITIVE — AB
Protein, ur: NEGATIVE mg/dL
Specific Gravity, Urine: 1.019 (ref 1.005–1.030)
pH: 8 (ref 5.0–8.0)

## 2018-08-16 LAB — HCG, QUANTITATIVE, PREGNANCY: HCG, BETA CHAIN, QUANT, S: 1 m[IU]/mL (ref <5)

## 2018-08-16 LAB — POCT PREGNANCY, URINE: PREG TEST UR: NEGATIVE

## 2018-08-16 MED ORDER — NITROFURANTOIN MONOHYD MACRO 100 MG PO CAPS: 100.0000 mg | ORAL_CAPSULE | Freq: Two times a day (BID) | ORAL | 0 refills | Status: DC

## 2018-08-16 NOTE — Discharge Instructions (Signed)
[facility] [employer] [facility] [employer] at [facility]       Phone: [phone]  Center for [employer] at [place] Phone: [phone]  [facility] for [employer] at [place]  Phone: [phone]  [facility] for [employer] at [employer]  Phone: [phone]  Center for [facility] at [place]  Phone: [phone]  [facility]       Phone: [phone]  [facility]    Phone: [phone]   [facility] [place])    Phone: [phone]  [facility]    Phone: [phone]  [facility]    Phone: [phone]  [facility]    Phone: [phone]  [facility]       Phone: [phone]   [facility]  Phone: [phone]  [facility]    Phone: [phone]  [facility] [place] [facility]   Phone: [phone]  [facility]      Phone: [phone]  [facility]    Phone: [phone]    Home Pregnancy Test Information  A home pregnancy test helps you determine whether you are pregnant or not. There are several types of home pregnancy tests that can be bought at a grocery store or pharmacy. What is being tested? A home pregnancy test detects the presence of a hormone in your urine. The hormone is produced by cells of the placenta (human chorionic gonadotropin, or hCG). The placenta is the organ that forms to nourish and support a developing baby. How are pregnancy tests done? Home pregnancy tests require a urine sample.  Most kits use a plastic testing device with a strip of paper that indicates whether there is hCG in your urine.  Follow the test package instructions very carefully for how to test your urine. Depending on the test, you may need to: ? Urinate directly onto the  stick. ? Urinate into a cup.  Wait for the results as directed by the package instructions. The amount of time may be different for each type of test.  Follow the test package instructions for how to read your test results. Depending on the test, results may be displayed as: ? A plus or a minus sign. ? One or two lines. ? "Pregnant" or "not pregnant."  For best results, use your first urine of the morning. That is when the concentration of hCG is highest. How accurate are home pregnancy tests? Home pregnancy tests are very accurate when:  You are at least 3-[redacted] weeks pregnant.  It has been 1-2 weeks since your missed period.  You use the test according to the package instructions. What can interfere with home pregnancy test results? Sometimes, a home pregnancy test may report that you are pregnant when you are not pregnant (false-positive result). This can happen if you:  Are taking certain medicines, such as: ? Medicine to control seizures. ? Anti-anxiety medicine. ? Fertility medicine with hCG.  Have a medical condition that affects your hormone levels.  Had a recent pregnancy loss (miscarriage) or abortion. Sometimes, a home pregnancy test may report that you are not pregnant when you are pregnant (false-negative result). This can happen if you:  Took the test too early in your pregnancy. Before 3-4 weeks of pregnancy, there may not be enough hCG to detect.  Drank a lot of liquid before the test.  Used an expired pregnancy test.  Are taking certain medicines,  such as antihistamines or water pills (diuretics). What should I do if I have a positive pregnancy test? If you have a positive home pregnancy test, schedule an appointment with your health care provider. You might need additional testing to confirm the pregnancy. What should I do if I have a negative pregnancy test? If you have a negative home pregnancy test but still have symptoms of pregnancy, contact your health  care provider. Your health care provider will test a sample of your blood to check for pregnancy. In some cases, a blood test will return a positive result even if a urine test was negative because blood tests are more sensitive. This means blood tests can detect hCG earlier than home pregnancy tests. Follow these instructions at home: If you are pregnant, planning to become pregnant, or think you may be pregnant:  Do not drink alcohol.  Do not use street drugs.  Do not use any products that contain nicotine or tobacco, such as cigarettes and e-cigarettes. If you need help quitting, ask your health care provider.  Take a prenatal vitamin that contains at least 400 mcg of folic acid daily. Summary  A home pregnancy test helps you determine whether you are pregnant or not by detecting the presence of the hormone human chorionic gonadotropin (hCG) in a sample of your urine.  Follow the test package instructions very carefully. For best results, use your first urine of the morning. That is when the concentration of hCG is highest.  Home pregnancy tests are very accurate when you are 3-[redacted] weeks pregnant or when it has been 1-2 weeks since your missed period.  A home pregnancy test may report that you are pregnant when you are not pregnant or that you are not pregnant when you are pregnant.  Contact your health care provider to confirm your results. Your health care provider will test a sample of your blood to check for pregnancy. This information is not intended to replace advice given to you by your health care provider. Make sure you discuss any questions you have with your health care provider. Document Released: 08/03/2003 Document Revised: 08/13/2017 Document Reviewed: 08/13/2017 Elsevier Interactive Patient Education  2019 [employer].
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
## 2018-08-16 NOTE — MAU Provider Note (Signed)
History     CSN: [id number]  Arrival date and time: 08/16/18 1648   First Provider Initiated Contact with Patient 08/16/18 1924      Chief Complaint  Patient presents with  . Abdominal Pain  . Vaginal Pain  . Possible Pregnancy   HPI Kristie Crawford is a 18 y.o. non pregnant female who presents with vaginal discomfort. She reports an increase in thick white vaginal discharge. She reports a positive pregnancy test and a negative pregnancy test. She denies any pain or bleeding.   OB History   No obstetric history on file.     Past Medical History:  Diagnosis Date  . Ovarian cyst   . Recurrent tonsillitis 09/2017   current antibiotic, will finish 09/25/2017    Past Surgical History:  Procedure Laterality Date  . TONSILLECTOMY N/A 10/01/2017   Procedure: TONSILLECTOMY;  Surgeon: Serena Colonel, MD;  Location: [facility];  Service: ENT;  Laterality: N/A;  . WISDOM TOOTH EXTRACTION  10/2014    Family History  Problem Relation Age of Onset  . Cancer Mother   . Cancer Maternal Aunt   . Cancer Maternal Uncle   . Cancer Maternal Grandmother   . Stroke Maternal Grandmother   . Diabetes Maternal Grandmother     Social History   Tobacco Use  . Smoking status: Never Smoker  . Smokeless tobacco: Never Used  Substance Use Topics  . Alcohol use: No  . Drug use: No    Allergies:  Allergies  Allergen Reactions  . Dexamethasone Swelling  . Ciprofloxacin Rash    Medications Prior to Admission  Medication Sig Dispense Refill Last Dose  . acetaminophen (TYLENOL) 500 MG tablet Take 500 mg by mouth every 6 (six) hours as needed for mild pain.   More than a month at Unknown time  . ondansetron (ZOFRAN ODT) 8 MG disintegrating tablet 8mg  ODT q4 hours prn nausea (Patient not taking: Reported on 06/16/2018) 6 tablet 0 More than a month at Unknown time    Review of Systems  Constitutional: Negative.  Negative for fatigue and fever.  HENT: Negative.    Respiratory: Negative.  Negative for shortness of breath.   Cardiovascular: Negative.  Negative for chest pain.  Gastrointestinal: Positive for abdominal pain. Negative for constipation, diarrhea, nausea and vomiting.  Genitourinary: Positive for pelvic pain and vaginal discharge. Negative for dysuria.  Neurological: Negative.  Negative for dizziness and headaches.   Physical Exam   Blood pressure 120/67, pulse 81, temperature 98.6 F (37 C), temperature source Oral, resp. rate 16, weight 64.4 kg, last menstrual period 07/11/2018, SpO2 100 %.  Physical Exam  Nursing note and vitals reviewed. Constitutional: She is oriented to person, place, and time. She appears well-developed and well-nourished. No distress.  HENT:  Head: Normocephalic.  Eyes: Pupils are equal, round, and reactive to light.  Cardiovascular: Normal rate, regular rhythm and normal heart sounds.  Respiratory: Effort normal and breath sounds normal. No respiratory distress.  GI: Soft. Bowel sounds are normal. She exhibits no distension. There is no abdominal tenderness.  Neurological: She is alert and oriented to person, place, and time.  Skin: Skin is warm and dry.  Psychiatric: She has a normal mood and affect. Her behavior is normal. Judgment and thought content normal.    MAU Course  Procedures Results for orders placed or performed during the hospital encounter of 08/16/18 (from the past 24 hour(s))  Urinalysis, Routine w reflex microscopic     Status: Abnormal  Collection Time: 08/16/18  5:40 PM  Result Value Ref Range   Color, Urine YELLOW YELLOW   APPearance HAZY (A) CLEAR   Specific Gravity, Urine 1.019 1.005 - 1.030   pH 8.0 5.0 - 8.0   Glucose, UA NEGATIVE NEGATIVE mg/dL   Hgb urine dipstick NEGATIVE NEGATIVE   Bilirubin Urine NEGATIVE NEGATIVE   Ketones, ur NEGATIVE NEGATIVE mg/dL   Protein, ur NEGATIVE NEGATIVE mg/dL   Nitrite POSITIVE (A) NEGATIVE   Leukocytes, UA TRACE (A) NEGATIVE   RBC / HPF  0-5 0 - 5 RBC/hpf   WBC, UA 11-20 0 - 5 WBC/hpf   Bacteria, UA RARE (A) NONE SEEN   Squamous Epithelial / LPF 0-5 0 - 5   Mucus PRESENT   Pregnancy, urine POC     Status: None   Collection Time: 08/16/18  5:55 PM  Result Value Ref Range   Preg Test, Ur NEGATIVE NEGATIVE  CBC     Status: None   Collection Time: 08/16/18  6:27 PM  Result Value Ref Range   WBC 6.7 4.0 - 10.5 K/uL   RBC 4.54 3.87 - 5.11 MIL/uL   Hemoglobin 12.5 12.0 - 15.0 g/dL   HCT 76.7 34.1 - 93.7 %   MCV 83.0 80.0 - 100.0 fL   MCH 27.5 26.0 - 34.0 pg   MCHC 33.2 30.0 - 36.0 g/dL   RDW 90.2 40.9 - 73.5 %   Platelets 192 150 - 400 K/uL   nRBC 0.0 0.0 - 0.2 %  hCG, quantitative, pregnancy     Status: None   Collection Time: 08/16/18  6:27 PM  Result Value Ref Range   hCG, Beta Chain, Quant, S 1 <5 mIU/mL   MDM UA, UPT UC CBC, HCG  After informing patient of negative HCG, patient refused speculum exam and vaginal swabs.  Assessment and Plan   1. Negative pregnancy test   2. Vaginal discharge   3. Acute cystitis with hematuria    -Discharge home in stable condition -RX for macrobid sent to patient's pharmacy -Pregnancy precautions discussed -Patient advised to follow-up with gyn of choice for routine needs -Patient may return to MAU as needed or if her condition were to change or worsen  Rolm Bookbinder CNM 08/16/2018, 7:24 PM

## 2018-08-19 LAB — URINE CULTURE

## 2018-09-29 ENCOUNTER — Emergency Department (HOSPITAL_COMMUNITY): Payer: Medicaid Other

## 2018-09-29 ENCOUNTER — Encounter (HOSPITAL_COMMUNITY): Payer: Self-pay | Admitting: Emergency Medicine

## 2018-09-29 ENCOUNTER — Other Ambulatory Visit: Payer: Self-pay

## 2018-09-29 ENCOUNTER — Emergency Department (HOSPITAL_COMMUNITY)
Admission: EM | Admit: 2018-09-29 | Discharge: 2018-09-30 | Disposition: A | Discharge status: Home / Self Care | Payer: Medicaid Other | Attending: Emergency Medicine | Admitting: Emergency Medicine

## 2018-09-29 DIAGNOSIS — Z79899 Other long term (current) drug therapy: Secondary | ICD-10-CM | POA: Insufficient documentation

## 2018-09-29 DIAGNOSIS — O99511 Diseases of the respiratory system complicating pregnancy, first trimester: Secondary | ICD-10-CM | POA: Insufficient documentation

## 2018-09-29 DIAGNOSIS — O2331 Infections of other parts of urinary tract in pregnancy, first trimester: Secondary | ICD-10-CM | POA: Insufficient documentation

## 2018-09-29 DIAGNOSIS — R509 Fever, unspecified: Secondary | ICD-10-CM | POA: Insufficient documentation

## 2018-09-29 DIAGNOSIS — O23599 Infection of other part of genital tract in pregnancy, unspecified trimester: Secondary | ICD-10-CM

## 2018-09-29 DIAGNOSIS — B9689 Other specified bacterial agents as the cause of diseases classified elsewhere: Secondary | ICD-10-CM

## 2018-09-29 DIAGNOSIS — J069 Acute upper respiratory infection, unspecified: Secondary | ICD-10-CM

## 2018-09-29 DIAGNOSIS — R102 Pelvic and perineal pain: Secondary | ICD-10-CM | POA: Diagnosis not present

## 2018-09-29 DIAGNOSIS — O9989 Other specified diseases and conditions complicating pregnancy, childbirth and the puerperium: Secondary | ICD-10-CM | POA: Diagnosis present

## 2018-09-29 DIAGNOSIS — Z3A01 Less than 8 weeks gestation of pregnancy: Secondary | ICD-10-CM | POA: Diagnosis not present

## 2018-09-29 DIAGNOSIS — B9789 Other viral agents as the cause of diseases classified elsewhere: Secondary | ICD-10-CM

## 2018-09-29 LAB — I-STAT BETA HCG BLOOD, ED (MC, WL, AP ONLY): I-stat hCG, quantitative: >2000 m[IU]/mL — ABNORMAL HIGH (ref <5)

## 2018-09-29 NOTE — ED Triage Notes (Addendum)
Patient complaining of LLQ pain that began Friday. Patient describes pain as sharp, stabbing, intermittent. Pain is made worse by breathing deeply and improved by positioning. Patient states the pain gets up to an 8/10. Patient was seen by [facility] earlier this evening and discharged self after denying cervical exam. Patient was given tylenol and fluids at [facility]. Patient states that she is [redacted] weeks pregnant. Hx of ovarian cysts.

## 2018-09-29 NOTE — ED Triage Notes (Deleted)
Patient complaining of LLQ pain that started last Friday. Patient states that the pain is sharp, stabbing, intermittent pain. Pain gets up to an 8/10. Patient states that she had a fever when the pain first began. Patient given tylenol at [facility] around 1840. Patient discharged herself after refusing cervical exam. Patient states that she is [redacted] weeks pregnant. Hx of ovarian cysts.

## 2018-09-29 NOTE — ED Provider Notes (Signed)
[facility] Provider Note   CSN: [id number] Arrival date & time: 09/29/18  1920     History   Chief Complaint Chief Complaint  Patient presents with  . Fever    HPI Kristie Crawford is a G1P0000 18 y.o. female history of ovarian cyst presents to the emergency department with a chief complaint of fever and abdominal pain x2 days.  Reports that she is currently [redacted] weeks pregnant.  She reports that T-max at home of 101.9.  She has been treating her symptoms with Tylenol.  She last took 1000 g of Tylenol at 7 PM.  She reports constant "pricking" periumbilical pain with intermittent severe, sharp left lower quadrant pain.  Reports the left lower quadrant pain is improved when she lays on her side.  No other known aggravating or alleviating factors.  She also reports worsening white vaginal discharge over the last week.  She reports that she has not had sexual intercourse since she found out she was pregnant due to the dyspareunia.  She reports that she has been having bilateral low back pain since her pregnancy began.  No change in quality or severity since onset.  Also reports mild, nonbloody diarrhea, rhinorrhea, nasal congestion, and nonproductive cough for the last 3 days.  She denies nausea, vomiting, chills, chest pain, shortness of breath, rash, dysuria, hematuria, constipation.  LMP 07/11/18. She is established with [facility] and has an upcoming appointment on February 28.  She reports that she was diagnosed and treated for influenza B in December.  She is not up-to-date on her influenza vaccination.  She was seen earlier today at [facility] and was diagnosed with a viral URI, but she was concerned about the abdominal pain since she is pregnant so she returned to the ER for second evaluation.  No history of abdominal surgery.  The history is provided by the patient. No language interpreter was used.    Past Medical History:    Diagnosis Date  . Ovarian cyst   . Recurrent tonsillitis 09/2017   current antibiotic, will finish 09/25/2017    Patient Active Problem List   Diagnosis Date Noted  . History of dental surgery-wisdom teeth extraction 01/03/2015  . Facial abscess 01/02/2015  . Trismus   . Allergic rhinitis, seasonal 07/28/2014    Past Surgical History:  Procedure Laterality Date  . TONSILLECTOMY N/A 10/01/2017   Procedure: TONSILLECTOMY;  Surgeon: Serena Colonelosen, Jefry, MD;  Location: [facility];  Service: ENT;  Laterality: N/A;  . WISDOM TOOTH EXTRACTION  10/2014     OB History    Gravida  1   Para      Term      Preterm      AB      Living        SAB      TAB      Ectopic      Multiple      Live Births               Home Medications    Prior to Admission medications   Medication Sig Start Date End Date Taking? Authorizing Provider  acetaminophen (TYLENOL) 500 MG tablet Take 1,000 mg by mouth every 4 (four) hours as needed for mild pain or fever.    Yes [provider]  Prenatal Vit-Fe Fumarate-FA (PRENATAL MULTIVITAMIN) TABS tablet Take 1 tablet by mouth daily at 12 noon.   Yes [provider]  clindamycin (  CLEOCIN) 300 MG capsule Take 1 capsule (300 mg total) by mouth 2 (two) times daily for 7 days. 09/30/18 10/07/18  Makena Murdock, Coral Else, PA-C    Family History Family History  Problem Relation Age of Onset  . Cancer Mother   . Cancer Maternal Aunt   . Cancer Maternal Uncle   . Cancer Maternal Grandmother   . Stroke Maternal Grandmother   . Diabetes Maternal Grandmother     Social History Social History   Tobacco Use  . Smoking status: Never Smoker  . Smokeless tobacco: Never Used  Substance Use Topics  . Alcohol use: No  . Drug use: No     Allergies   Dexamethasone and Ciprofloxacin   Review of Systems Review of Systems  Constitutional: Positive for fever. Negative for activity change and chills.  HENT: Positive for  congestion. Negative for sinus pressure and sinus pain.   Respiratory: Positive for cough. Negative for shortness of breath and wheezing.   Cardiovascular: Negative for chest pain, palpitations and leg swelling.  Gastrointestinal: Positive for abdominal pain and diarrhea. Negative for constipation, nausea and vomiting.  Genitourinary: Positive for vaginal discharge. Negative for difficulty urinating, dysuria, flank pain, hematuria, menstrual problem, urgency, vaginal bleeding and vaginal pain.  Musculoskeletal: Negative for back pain, myalgias, neck pain and neck stiffness.  Skin: Negative for rash.  Allergic/Immunologic: Negative for immunocompromised state.  Neurological: Negative for dizziness, weakness, numbness and headaches.  Psychiatric/Behavioral: Negative for confusion.     Physical Exam Updated Vital Signs BP 100/72   Pulse 77   Temp 98.3 F (36.8 C) (Oral)   Resp 17   Ht 5\' 2"  (1.575 m)   Wt 63.5 kg   LMP 07/03/2018 (Exact Date)   SpO2 100%   BMI 25.61 kg/m   Physical Exam Vitals signs and nursing note reviewed. Exam conducted with a chaperone present.  Constitutional:      General: She is not in acute distress. HENT:     Head: Normocephalic.  Eyes:     Conjunctiva/sclera: Conjunctivae normal.  Neck:     Musculoskeletal: Neck supple.  Cardiovascular:     Rate and Rhythm: Normal rate and regular rhythm.     Heart sounds: No murmur. No friction rub. No gallop.   Pulmonary:     Effort: Pulmonary effort is normal. No respiratory distress.     Breath sounds: No stridor. No wheezing, rhonchi or rales.  Chest:     Chest wall: No tenderness.  Abdominal:     General: There is no distension.     Palpations: Abdomen is soft. There is no mass.     Tenderness: There is abdominal tenderness. There is no right CVA tenderness, left CVA tenderness, guarding or rebound.     Hernia: No hernia is present.     Comments: Tender to palpation in the left pelvic region.  No  right-sided pelvic pain.  No suprapubic discomfort.  No CVA tenderness bilaterally.  Abdomen is soft, nondistended.  No rebound or guarding.  No tenderness over McBurney's point.  Negative Murphy sign.  Genitourinary:    Comments: Large amount of white vaginal discharge in the vaginal vault.  No adnexal tenderness or fullness bilaterally.  No cervical motion tenderness. Musculoskeletal:        General: No tenderness.     Right lower leg: No edema.     Left lower leg: No edema.  Skin:    General: Skin is warm.     Findings: No rash.  Neurological:  Mental Status: She is alert.  Psychiatric:        Behavior: Behavior normal.      ED Treatments / Results  Labs (all labs ordered are listed, but only abnormal results are displayed) Labs Reviewed  WET PREP, GENITAL - Abnormal; Notable for the following components:      Result Value   Clue Cells Wet Prep HPF POC PRESENT (*)    WBC, Wet Prep HPF POC FEW (*)    All other components within normal limits  HCG, QUANTITATIVE, PREGNANCY - Abnormal; Notable for the following components:   hCG, Beta Chain, Quant, S 96,597 (*)    All other components within normal limits  CBC WITH DIFFERENTIAL/PLATELET - Abnormal; Notable for the following components:   Hemoglobin 11.2 (*)    HCT 34.1 (*)    All other components within normal limits  COMPREHENSIVE METABOLIC PANEL - Abnormal; Notable for the following components:   CO2 21 (*)    BUN <5 (*)    Calcium 8.6 (*)    Total Protein 5.8 (*)    All other components within normal limits  I-STAT BETA HCG BLOOD, ED ([facility], [facility], [facility] ONLY) - Abnormal; Notable for the following components:   I-stat hCG, quantitative >2,000.0 (*)    All other components within normal limits  URINALYSIS, ROUTINE W REFLEX MICROSCOPIC  I-STAT BETA HCG BLOOD, ED ([facility], [facility], [facility] ONLY)  GC/CHLAMYDIA PROBE AMP ([place]) NOT AT [facility]    EKG None  Radiology US Ob Transvaginal  Result Date: 09/30/2018 CLINICAL DATA:   Left-sided pelvic pain.  Beta HCG greater than 2000. EXAM: OBSTETRIC <14 WK [place] AND TRANSVAGINAL OB US TECHNIQUE: Both transabdominal and transvaginal ultrasound examinations were performed for complete evaluation of the gestation as well as the maternal uterus, adnexal regions, and pelvic cul-de-sac. Transvaginal technique was performed to assess early pregnancy. COMPARISON:  None. FINDINGS: Intrauterine gestational sac: Single Yolk sac:  Visualized. Embryo:  Visualized. Cardiac Activity: Visualized. Heart Rate: 171 bpm CRL: 20.3 mm 8 w 4 no complicating features are identified. D [place] EDC: 05/08/2019 Subchorionic hemorrhage:  None visualized. Maternal uterus/adnexae: The right ovary was not visualized. The left ovary was unremarkable measuring 3.5 x 1.9 x 2 cm. Trace physiologic fluid in the cul-de-sac. The uterus is slightly anteverted in appearance. IMPRESSION: Viable single live intrauterine gestation at 8 weeks 4 days by crown-rump length. Electronically Signed   By: Tollie Eth M.D.   On: 09/30/2018 00:37    Procedures Procedures (including critical care time)  Medications Ordered in ED Medications - No data to display   Initial Impression / Assessment and Plan / ED Course  I have reviewed the triage vital signs and the nursing notes.  Pertinent labs & imaging results that were available during my care of the patient were reviewed by me and considered in my medical decision making (see chart for details).     18 year old female with a history of ovarian cyst presenting with abdominal pain, fever, and worsening vaginal discharge.   Afebrile in the ER as the patient just took 1000 mg of Tylenol within the last 4 hours.  I-STAT pregnancy test is greater than 2000.  Pelvic ultrasound is positive for intrauterine pregnancy and is unremarkable.  Pelvic exam with large amounts of white vaginal discharge in the vaginal vault.  Abdomen is soft, nondistended.  She does not have a surgical abdomen. Wet  prep with clue cells concerning for bacterial vaginosis.  Otherwise unremarkable.  GC and Chlamydia  are pending.  Labs are otherwise notable for hemoglobin of 11.2.  Bicarb is 21, which I suspect is secondary to diarrhea.  Urinalysis is unremarkable.  Suspect viral etiology as the source of her symptoms.  Will treat with clindamycin since she is in the first trimester for bacterial vaginosis.  Low suspicion for PID, ovarian torsion, TOA, cholecystitis, appendicitis, pyelonephritis, diverticulitis.  Strict return precautions given.  She is hemodynamically stable and in no acute distress.  Safe for discharge home with outpatient follow-up at this time.  Final Clinical Impressions(s) / ED Diagnoses   Final diagnoses:  Viral URI with cough  Bacterial vaginosis in pregnancy    ED Discharge Orders         Ordered    clindamycin (CLEOCIN) 300 MG capsule  2 times daily     09/30/18 0241           Amariyon Maynes A, PA-C 09/30/18 6812    Geoffery Lyons, MD 10/02/18 [phone]

## 2018-09-30 LAB — CBC WITH DIFFERENTIAL/PLATELET
ABS IMMATURE GRANULOCYTES: 0.02 10*3/uL (ref 0.00–0.07)
Basophils Absolute: 0 10*3/uL (ref 0.0–0.1)
Basophils Relative: 0 %
Eosinophils Absolute: 0.1 10*3/uL (ref 0.0–0.5)
Eosinophils Relative: 2 %
HCT: 34.1 % — ABNORMAL LOW (ref 36.0–46.0)
Hemoglobin: 11.2 g/dL — ABNORMAL LOW (ref 12.0–15.0)
Immature Granulocytes: 0 %
Lymphocytes Relative: 31 %
Lymphs Abs: 2.3 10*3/uL (ref 0.7–4.0)
MCH: 27.6 pg (ref 26.0–34.0)
MCHC: 32.8 g/dL (ref 30.0–36.0)
MCV: 84 fL (ref 80.0–100.0)
Monocytes Absolute: 1 10*3/uL (ref 0.1–1.0)
Monocytes Relative: 13 %
NEUTROS ABS: 4 10*3/uL (ref 1.7–7.7)
NEUTROS PCT: 54 %
PLATELETS: 164 10*3/uL (ref 150–400)
RBC: 4.06 MIL/uL (ref 3.87–5.11)
RDW: 14.3 % (ref 11.5–15.5)
WBC: 7.4 10*3/uL (ref 4.0–10.5)
nRBC: 0 % (ref 0.0–0.2)

## 2018-09-30 LAB — URINALYSIS, ROUTINE W REFLEX MICROSCOPIC
Bilirubin Urine: NEGATIVE
Glucose, UA: NEGATIVE mg/dL
Hgb urine dipstick: NEGATIVE
Ketones, ur: NEGATIVE mg/dL
Leukocytes,Ua: NEGATIVE
Nitrite: NEGATIVE
Protein, ur: NEGATIVE mg/dL
SPECIFIC GRAVITY, URINE: 1.014 (ref 1.005–1.030)
pH: 7 (ref 5.0–8.0)

## 2018-09-30 LAB — WET PREP, GENITAL
Sperm: NONE SEEN
Trich, Wet Prep: NONE SEEN
Yeast Wet Prep HPF POC: NONE SEEN

## 2018-09-30 LAB — COMPREHENSIVE METABOLIC PANEL
ALT: 15 U/L (ref 0–44)
AST: 16 U/L (ref 15–41)
Albumin: 3.5 g/dL (ref 3.5–5.0)
Alkaline Phosphatase: 39 U/L (ref 38–126)
Anion gap: 7 (ref 5–15)
BUN: <5 mg/dL — ABNORMAL LOW (ref 6–20)
CHLORIDE: 107 mmol/L (ref 98–111)
CO2: 21 mmol/L — AB (ref 22–32)
Calcium: 8.6 mg/dL — ABNORMAL LOW (ref 8.9–10.3)
Creatinine, Ser: 0.52 mg/dL (ref 0.44–1.00)
GFR calc Af Amer: >60 mL/min (ref >60)
GFR calc non Af Amer: >60 mL/min (ref >60)
Glucose, Bld: 84 mg/dL (ref 70–99)
Potassium: 3.9 mmol/L (ref 3.5–5.1)
Sodium: 135 mmol/L (ref 135–145)
Total Bilirubin: 0.6 mg/dL (ref 0.3–1.2)
Total Protein: 5.8 g/dL — ABNORMAL LOW (ref 6.5–8.1)

## 2018-09-30 LAB — GC/CHLAMYDIA PROBE AMP (~~LOC~~) NOT AT ARMC
Chlamydia: NEGATIVE
Neisseria Gonorrhea: NEGATIVE

## 2018-09-30 LAB — HCG, QUANTITATIVE, PREGNANCY: hCG, Beta Chain, Quant, S: 96597 m[IU]/mL — ABNORMAL HIGH (ref <5)

## 2018-09-30 MED ORDER — CLINDAMYCIN HCL 300 MG PO CAPS: 300.0000 mg | ORAL_CAPSULE | Freq: Two times a day (BID) | ORAL | 0 refills | Status: AC

## 2018-09-30 NOTE — Discharge Instructions (Signed)
Thank you for allowing me to care for you today in the Emergency Department.   Please keep your appointment with your OB/GYN on the 28th.  Follow-up with your OB/GYN sooner if your symptoms do not start to improve in the next 48 to 72 hours.  If you develop vaginal bleeding, worsening abdominal pain, or if you have any medical concerns that you think may be related to your pregnancy, I would consider going to [facility], which will relocate to the [facility] on February 23 or to [facility] before then.  If you went to [facility], it could be more challenging related to your pregnancy if your OB/GYN is affiliated with [facility].  You can take at thousand milligrams of Tylenol every 8 hours for pain or fever.  Take 1 capsule of clindamycin 2 times daily for the next 7 days for bacterial vaginosis.  Make sure to take the entire course even if your symptoms significantly improve.  Return to the emergency department if you develop vaginal bleeding, uncontrollable abdominal pain, uncontrollable vomiting, if your fever did not resolve in the next 48 to 72 hours, severe shortness of breath, or other new, concerning symptoms.

## 2018-09-30 NOTE — ED Notes (Signed)
Patient discharged from facility. Patient states understanding of discharge instructions. Patient ambulating out of facility with significant other. Patient stable.

## 2018-11-09 ENCOUNTER — Encounter (HOSPITAL_COMMUNITY): Payer: Self-pay | Admitting: *Deleted

## 2018-11-09 ENCOUNTER — Other Ambulatory Visit: Payer: Self-pay

## 2018-11-09 ENCOUNTER — Inpatient Hospital Stay (HOSPITAL_COMMUNITY): Payer: Medicaid Other

## 2018-11-09 ENCOUNTER — Inpatient Hospital Stay (HOSPITAL_COMMUNITY)
Admission: AD | Admit: 2018-11-09 | Discharge: 2018-11-10 | Disposition: A | Discharge status: Home / Self Care | Payer: Medicaid Other | Source: Ambulatory Visit | Attending: Emergency Medicine | Admitting: Emergency Medicine

## 2018-11-09 DIAGNOSIS — O9989 Other specified diseases and conditions complicating pregnancy, childbirth and the puerperium: Secondary | ICD-10-CM | POA: Insufficient documentation

## 2018-11-09 DIAGNOSIS — Z881 Allergy status to other antibiotic agents status: Secondary | ICD-10-CM | POA: Diagnosis not present

## 2018-11-09 DIAGNOSIS — Z833 Family history of diabetes mellitus: Secondary | ICD-10-CM | POA: Diagnosis not present

## 2018-11-09 DIAGNOSIS — Z3A14 14 weeks gestation of pregnancy: Secondary | ICD-10-CM

## 2018-11-09 DIAGNOSIS — R509 Fever, unspecified: Secondary | ICD-10-CM

## 2018-11-09 DIAGNOSIS — O99282 Endocrine, nutritional and metabolic diseases complicating pregnancy, second trimester: Secondary | ICD-10-CM | POA: Diagnosis not present

## 2018-11-09 DIAGNOSIS — Z809 Family history of malignant neoplasm, unspecified: Secondary | ICD-10-CM | POA: Diagnosis not present

## 2018-11-09 DIAGNOSIS — E860 Dehydration: Secondary | ICD-10-CM | POA: Diagnosis not present

## 2018-11-09 DIAGNOSIS — R1011 Right upper quadrant pain: Secondary | ICD-10-CM | POA: Diagnosis not present

## 2018-11-09 DIAGNOSIS — O219 Vomiting of pregnancy, unspecified: Secondary | ICD-10-CM | POA: Insufficient documentation

## 2018-11-09 DIAGNOSIS — R52 Pain, unspecified: Secondary | ICD-10-CM

## 2018-11-09 LAB — CBC WITH DIFFERENTIAL/PLATELET
Abs Immature Granulocytes: 0.06 10*3/uL (ref 0.00–0.07)
BASOS PCT: 0 %
Basophils Absolute: 0 10*3/uL (ref 0.0–0.1)
EOS PCT: 0 %
Eosinophils Absolute: 0 10*3/uL (ref 0.0–0.5)
HCT: 38.4 % (ref 36.0–46.0)
Hemoglobin: 12.6 g/dL (ref 12.0–15.0)
Immature Granulocytes: 1 %
Lymphocytes Relative: 6 %
Lymphs Abs: 0.6 10*3/uL — ABNORMAL LOW (ref 0.7–4.0)
MCH: 27.4 pg (ref 26.0–34.0)
MCHC: 32.8 g/dL (ref 30.0–36.0)
MCV: 83.5 fL (ref 80.0–100.0)
MONOS PCT: 6 %
Monocytes Absolute: 0.5 10*3/uL (ref 0.1–1.0)
Neutro Abs: 8.7 10*3/uL — ABNORMAL HIGH (ref 1.7–7.7)
Neutrophils Relative %: 87 %
Platelets: 155 10*3/uL (ref 150–400)
RBC: 4.6 MIL/uL (ref 3.87–5.11)
RDW: 13.8 % (ref 11.5–15.5)
WBC: 9.8 10*3/uL (ref 4.0–10.5)
nRBC: 0 % (ref 0.0–0.2)

## 2018-11-09 LAB — COMPREHENSIVE METABOLIC PANEL
ALBUMIN: 3.6 g/dL (ref 3.5–5.0)
ALT: 14 U/L (ref 0–44)
AST: 16 U/L (ref 15–41)
Alkaline Phosphatase: 54 U/L (ref 38–126)
Anion gap: 9 (ref 5–15)
BUN: 6 mg/dL (ref 6–20)
CO2: 22 mmol/L (ref 22–32)
Calcium: 9.2 mg/dL (ref 8.9–10.3)
Chloride: 103 mmol/L (ref 98–111)
Creatinine, Ser: 0.53 mg/dL (ref 0.44–1.00)
GFR calc Af Amer: >60 mL/min (ref >60)
GFR calc non Af Amer: >60 mL/min (ref >60)
GLUCOSE: 82 mg/dL (ref 70–99)
Potassium: 3.9 mmol/L (ref 3.5–5.1)
Sodium: 134 mmol/L — ABNORMAL LOW (ref 135–145)
Total Bilirubin: 1.2 mg/dL (ref 0.3–1.2)
Total Protein: 6.3 g/dL — ABNORMAL LOW (ref 6.5–8.1)

## 2018-11-09 LAB — LIPASE, BLOOD: Lipase: 24 U/L (ref 11–51)

## 2018-11-09 MED ORDER — SODIUM CHLORIDE 0.9 % IV BOLUS
1000.0000 mL | Freq: Once | INTRAVENOUS | Status: AC
  Administered 2018-11-09: 1000 mL via INTRAVENOUS

## 2018-11-09 MED ORDER — MORPHINE SULFATE (PF) 4 MG/ML IV SOLN
4.0000 mg | Freq: Once | INTRAVENOUS | Status: AC
  Administered 2018-11-09: 4 mg via INTRAVENOUS
  Filled 2018-11-09: qty 1

## 2018-11-09 MED ORDER — ONDANSETRON HCL 4 MG/2ML IJ SOLN
4.0000 mg | Freq: Once | INTRAMUSCULAR | Status: AC
  Administered 2018-11-09: 4 mg via INTRAVENOUS
  Filled 2018-11-09: qty 2

## 2018-11-09 NOTE — ED Triage Notes (Addendum)
Pt coming after going to [facility] area in hospital with abd pain and N/V/D since 0500 today. Per pt MD told her to come to ER after her temp increased to 101 this evening. Taking Tylenol and Phenergan that was prescribed by her MD. [redacted] weeks pregnant. No blood noted. Pt alert and oriented.

## 2018-11-09 NOTE — MAU Provider Note (Signed)
Kristie Crawford is a 18 y.o. female here with fever, N/v and right sided abdominal pain. The pain started this morning around 0500. She has vomited every hour and fever reached 101. She recently took tylenol prior to arrival to MAU. She called her OB who recommended she come to [facility].  She has had no travel and no sick contacts other than a few trips to [employer] for groceries.  The pain in her abdomen is mid-upper and right sided. It does not radiate.   MSE Done Spoke to Dr. Ova Freshwater for transfer to the [facility] ED for further evaluation of abdominal pain and fever.  Dr. Ova Freshwater is agreeable to have the patient transferred.   Duane Lope, NP 11/09/2018 9:26 PM

## 2018-11-09 NOTE — ED Provider Notes (Signed)
[facility] EMERGENCY DEPARTMENT Provider Note   CSN: [id number] Arrival date & time: 11/09/18  2036    History   Chief Complaint Chief Complaint  Patient presents with  . Abdominal Pain  . Nausea  . Emesis    HPI Kristie Crawford is a 18 y.o. female.     Pt presents to the ED today with n/v, abdominal pain, and fever.  Pt said sx started today around 0500.  She did have a fever of 101 and took a tylenol.  She is about [redacted] weeks pregnant.  She initially went up to MAU, but then was transferred down here as this is not a gyn issue.  She denies cough or uri sx.  She has tried phenergan for the vomiting, but said that has not helped.  No travel or known sick exposures.      Past Medical History:  Diagnosis Date  . Ovarian cyst   . Recurrent tonsillitis 09/2017   current antibiotic, will finish 09/25/2017    Patient Active Problem List   Diagnosis Date Noted  . History of dental surgery-wisdom teeth extraction 01/03/2015  . Facial abscess 01/02/2015  . Trismus   . Allergic rhinitis, seasonal 07/28/2014    Past Surgical History:  Procedure Laterality Date  . TONSILLECTOMY N/A 10/01/2017   Procedure: TONSILLECTOMY;  Surgeon: Serena Colonel, MD;  Location: [facility];  Service: ENT;  Laterality: N/A;  . WISDOM TOOTH EXTRACTION  10/2014     OB History    Gravida  1   Para      Term      Preterm      AB      Living        SAB      TAB      Ectopic      Multiple      Live Births               Home Medications    Prior to Admission medications   Medication Sig Start Date End Date Taking? Authorizing Provider  acetaminophen (TYLENOL) 500 MG tablet Take 1,000 mg by mouth every 4 (four) hours as needed for mild pain or fever.     [provider]  Prenatal Vit-Fe Fumarate-FA (PRENATAL MULTIVITAMIN) TABS tablet Take 1 tablet by mouth daily at 12 noon.    [provider]    Family History Family  History  Problem Relation Age of Onset  . Cancer Mother   . Cancer Maternal Aunt   . Cancer Maternal Uncle   . Cancer Maternal Grandmother   . Stroke Maternal Grandmother   . Diabetes Maternal Grandmother     Social History Social History   Tobacco Use  . Smoking status: Never Smoker  . Smokeless tobacco: Never Used  Substance Use Topics  . Alcohol use: No  . Drug use: No     Allergies   Dexamethasone and Ciprofloxacin   Review of Systems Review of Systems  Constitutional: Positive for fever.  Gastrointestinal: Positive for abdominal pain, nausea and vomiting.  All other systems reviewed and are negative.    Physical Exam Updated Vital Signs BP 111/63   Pulse 89   Temp 98.8 F (37.1 C)   Resp 18   Ht 5\' 2"  (1.575 m)   Wt 65.8 kg   LMP 07/11/2018   SpO2 100%   BMI 26.52 kg/m   Physical Exam Vitals signs and nursing note reviewed.  Constitutional:  Appearance: She is well-developed.  HENT:     Head: Normocephalic and atraumatic.     Mouth/Throat:     Mouth: Mucous membranes are dry.     Pharynx: Oropharynx is clear.  Eyes:     Extraocular Movements: Extraocular movements intact.     Pupils: Pupils are equal, round, and reactive to light.  Cardiovascular:     Rate and Rhythm: Normal rate and regular rhythm.  Pulmonary:     Effort: Pulmonary effort is normal.     Breath sounds: Normal breath sounds.  Abdominal:     General: Abdomen is flat and scaphoid. Bowel sounds are increased.     Palpations: Abdomen is rigid.     Tenderness: There is abdominal tenderness in the right upper quadrant and epigastric area.  Skin:    General: Skin is warm and dry.     Capillary Refill: Capillary refill takes less than 2 seconds.  Neurological:     General: No focal deficit present.     Mental Status: She is alert and oriented to person, place, and time.  Psychiatric:        Mood and Affect: Mood normal.        Behavior: Behavior normal.      ED  Treatments / Results  Labs (all labs ordered are listed, but only abnormal results are displayed) Labs Reviewed  CBC WITH DIFFERENTIAL/PLATELET - Abnormal; Notable for the following components:      Result Value   Neutro Abs 8.7 (*)    Lymphs Abs 0.6 (*)    All other components within normal limits  COMPREHENSIVE METABOLIC PANEL - Abnormal; Notable for the following components:   Sodium 134 (*)    Total Protein 6.3 (*)    All other components within normal limits  LIPASE, BLOOD  URINALYSIS, ROUTINE W REFLEX MICROSCOPIC    EKG None  Radiology US Abdomen Limited Ruq  Result Date: 11/09/2018 CLINICAL DATA:  Abdominal pain. Nausea, vomiting, diarrhea. Pregnant patient at [redacted] weeks gestation. EXAM: ULTRASOUND ABDOMEN LIMITED RIGHT UPPER QUADRANT COMPARISON:  None. FINDINGS: Gallbladder: Distended. No gallstones or wall thickening visualized. No pericholecystic fluid. No sonographic Murphy sign noted by sonographer. Common bile duct: Diameter: 4-5 mm, normal. Liver: No focal lesion identified. Within normal limits in parenchymal echogenicity. Portal vein is patent on color Doppler imaging with normal direction of blood flow towards the liver. IMPRESSION: Unremarkable right upper quadrant ultrasound.  No gallstones. Electronically Signed   By: Narda Rutherford M.D.   On: 11/09/2018 22:52    Procedures Procedures (including critical care time)  Medications Ordered in ED Medications  morphine 4 MG/ML injection 4 mg (4 mg Intravenous Given 11/09/18 2303)  ondansetron (ZOFRAN) injection 4 mg (4 mg Intravenous Given 11/09/18 2302)  sodium chloride 0.9 % bolus 1,000 mL (1,000 mLs Intravenous New Bag/Given 11/09/18 2312)     Initial Impression / Assessment and Plan / ED Course  I have reviewed the triage vital signs and the nursing notes.  Pertinent labs & imaging results that were available during my care of the patient were reviewed by me and considered in my medical decision making (see  chart for details).       UA pending at shift change.  Pt signed out to Dr. Elesa Massed.  Final Clinical Impressions(s) / ED Diagnoses   Final diagnoses:  Fever and chills  Pain  Dehydration  [redacted] weeks gestation of pregnancy    ED Discharge Orders    None  Jacalyn Lefevre, MD 11/09/18 [phone]

## 2018-11-09 NOTE — MAU Note (Signed)
Pt stated she started having sharp abd pain more on her right side this morning and has had vomiting off and on most of the day. Had a small amount of diarrhea 1-2 times. Had a fever 101. Took tylenol and promethazine about 2 hours ago. Temp98.8 now still feeling nauseated  Not vomited since 7:30. Has felt SOB since the abd pain started.

## 2018-11-10 LAB — URINALYSIS, ROUTINE W REFLEX MICROSCOPIC
Bilirubin Urine: NEGATIVE
Glucose, UA: NEGATIVE mg/dL
Hgb urine dipstick: NEGATIVE
Ketones, ur: 80 mg/dL — AB
LEUKOCYTE UA: NEGATIVE
NITRITE: NEGATIVE
Protein, ur: NEGATIVE mg/dL
Specific Gravity, Urine: 1.024 (ref 1.005–1.030)
pH: 5 (ref 5.0–8.0)

## 2018-11-10 MED ORDER — SODIUM CHLORIDE 0.9 % IV BOLUS (SEPSIS)
1000.0000 mL | Freq: Once | INTRAVENOUS | Status: AC
  Administered 2018-11-10: 1000 mL via INTRAVENOUS

## 2018-11-10 NOTE — Discharge Instructions (Signed)
You may take Tylenol 1000 mg every 6 hours as needed for fever and pain.  If you develop diarrhea, you may take over-the-counter Imodium.  Please take your medications as prescribed at home for nausea and vomiting.  I recommend that she eat a bland diet for the next several days but increase your fluid intake and make sure that you are drinking at least 60 ounces of water a day.

## 2018-11-10 NOTE — ED Provider Notes (Signed)
12:00 AM  Assumed care from Dr. Particia Nearing.  Patient is a 18 year old female who presents with nausea, vomiting and upper abdominal pain.  She is approximately [redacted] weeks pregnant with an IUP.  Labs, right upper quadrant ultrasound unremarkable.  Urinalysis pending.  Patient will need to be able to tolerate p.o. as well prior to discharge home.  1:30 AM  Pt's urine shows no sign of infection but has large ketones even after 1 L of IV fluids.  She is drinking without difficulty.  Blood pressures are soft with this is after receiving morphine.  Will give second liter of IV fluid.  She states she is feeling much better.  She is comfortable with this plan.  5:20 AM  Pt still reports feeling much better.  She has received a total of 3 L of IV fluids.  Blood pressure still soft but this may be normal for her in pregnancy and related to narcotic injection.  She has been able to walk and does not feel dizzy.  She is able to drink without difficulty no further vomiting here.  I feel she is safe to be discharged home.  Recommended Tylenol for fever and pain.  She states she has antiemetics at home.  Discussed return precautions.  Recommended bland diet.  At this time, I do not feel there is any life-threatening condition present. I have reviewed and discussed all results (EKG, imaging, lab, urine as appropriate) and exam findings with patient/family. I have reviewed nursing notes and appropriate previous records.  I feel the patient is safe to be discharged home without further emergent workup and can continue workup as an outpatient as needed. Discussed usual and customary return precautions. Patient/family verbalize understanding and are comfortable with this plan.  Outpatient follow-up has been provided as needed. All questions have been answered.    Ward, Layla Maw, DO 11/10/18 [phone]

## 2019-02-07 ENCOUNTER — Other Ambulatory Visit: Payer: Self-pay

## 2019-02-07 ENCOUNTER — Inpatient Hospital Stay (HOSPITAL_COMMUNITY)
Admission: AD | Admit: 2019-02-07 | Discharge: 2019-02-07 | Disposition: A | Discharge status: Home / Self Care | Payer: Medicaid Other | Attending: Obstetrics and Gynecology | Admitting: Obstetrics and Gynecology

## 2019-02-07 ENCOUNTER — Encounter (HOSPITAL_COMMUNITY): Payer: Self-pay

## 2019-02-07 DIAGNOSIS — O99352 Diseases of the nervous system complicating pregnancy, second trimester: Secondary | ICD-10-CM | POA: Insufficient documentation

## 2019-02-07 DIAGNOSIS — O1202 Gestational edema, second trimester: Secondary | ICD-10-CM | POA: Diagnosis present

## 2019-02-07 DIAGNOSIS — O26892 Other specified pregnancy related conditions, second trimester: Secondary | ICD-10-CM

## 2019-02-07 DIAGNOSIS — Z3A27 27 weeks gestation of pregnancy: Secondary | ICD-10-CM | POA: Insufficient documentation

## 2019-02-07 DIAGNOSIS — G5600 Carpal tunnel syndrome, unspecified upper limb: Secondary | ICD-10-CM | POA: Diagnosis not present

## 2019-02-07 DIAGNOSIS — G5603 Carpal tunnel syndrome, bilateral upper limbs: Secondary | ICD-10-CM | POA: Insufficient documentation

## 2019-02-07 DIAGNOSIS — R600 Localized edema: Secondary | ICD-10-CM | POA: Diagnosis not present

## 2019-02-07 LAB — URINALYSIS, ROUTINE W REFLEX MICROSCOPIC
Bilirubin Urine: NEGATIVE
Glucose, UA: NEGATIVE mg/dL
Hgb urine dipstick: NEGATIVE
Ketones, ur: NEGATIVE mg/dL
Leukocytes,Ua: NEGATIVE
Nitrite: NEGATIVE
Protein, ur: NEGATIVE mg/dL
Specific Gravity, Urine: 1.016 (ref 1.005–1.030)
pH: 6 (ref 5.0–8.0)

## 2019-02-07 MED ORDER — ACETAMINOPHEN 500 MG PO TABS: 1000.0000 mg | ORAL_TABLET | Freq: Four times a day (QID) | ORAL | 0 refills | Status: DC | PRN

## 2019-02-07 NOTE — Discharge Instructions (Signed)
Carpal Tunnel Syndrome  Carpal tunnel syndrome is a condition that causes pain in your hand and arm. The carpal tunnel is a narrow area located on the palm side of your wrist. Repeated wrist motion or certain diseases may cause swelling within the tunnel. This swelling pinches the main nerve in the wrist (median nerve). What are the causes? This condition may be caused by:  Repeated wrist motions.  Wrist injuries.  Arthritis.  A cyst or tumor in the carpal tunnel.  Fluid buildup during pregnancy. Sometimes the cause of this condition is not known. What increases the risk? The following factors may make you more likely to develop this condition:  Having a job, such as being a butcher or a cashier, that requires you to repeatedly move your wrist in the same motion.  Being a woman.  Having certain conditions, such as: ? Diabetes. ? Obesity. ? An underactive thyroid (hypothyroidism). ? Kidney failure. What are the signs or symptoms? Symptoms of this condition include:  A tingling feeling in your fingers, especially in your thumb, index, and middle fingers.  Tingling or numbness in your hand.  An aching feeling in your entire arm, especially when your wrist and elbow are bent for a Mestas time.  Wrist pain that goes up your arm to your shoulder.  Pain that goes down into your palm or fingers.  A weak feeling in your hands. You may have trouble grabbing and holding items. Your symptoms may feel worse during the night. How is this diagnosed? This condition is diagnosed with a medical history and physical exam. You may also have tests, including:  Electromyogram (EMG). This test measures electrical signals sent by your nerves into the muscles.  Nerve conduction study. This test measures how well electrical signals pass through your nerves.  Imaging tests, such as X-rays, ultrasound, and MRI. These tests check for possible causes of your condition. How is this treated? This  condition may be treated with:  Lifestyle changes. It is important to stop or change the activity that caused your condition.  Doing exercise and activities to strengthen your muscles and bones (physical therapy).  Learning how to use your hand again after diagnosis (occupational therapy).  Medicines for pain and inflammation. This may include medicine that is injected into your wrist.  A wrist splint.  Surgery. Follow these instructions at home: If you have a splint:  Wear the splint as told by your health care provider. Remove it only as told by your health care provider.  Loosen the splint if your fingers tingle, become numb, or turn cold and blue.  Keep the splint clean.  If the splint is not waterproof: ? Do not let it get wet. ? Cover it with a watertight covering when you take a bath or shower. Managing pain, stiffness, and swelling   If directed, put ice on the painful area: ? If you have a removable splint, remove it as told by your health care provider. ? Put ice in a plastic bag. ? Place a towel between your skin and the bag. ? Leave the ice on for 20 minutes, 2-3 times per day. General instructions  Take over-the-counter and prescription medicines only as told by your health care provider.  Rest your wrist from any activity that may be causing your pain. If your condition is work related, talk with your employer about changes that can be made, such as getting a wrist pad to use while typing.  Do any exercises as told   by your health care provider, physical therapist, or occupational therapist.  Keep all follow-up visits as told by your health care provider. This is important. Contact a health care provider if:  You have new symptoms.  Your pain is not controlled with medicines.  Your symptoms get worse. Get help right away if:  You have severe numbness or tingling in your wrist or hand. Summary  Carpal tunnel syndrome is a condition that causes pain in  your hand and arm.  It is usually caused by repeated wrist motions.  Lifestyle changes and medicines are used to treat carpal tunnel syndrome. Surgery may be recommended.  Follow your health care provider's instructions about wearing a splint, resting from activity, keeping follow-up visits, and calling for help. This information is not intended to replace advice given to you by your health care provider. Make sure you discuss any questions you have with your health care provider. Document Released: 07/28/2000 Document Revised: 12/07/2017 Document Reviewed: 12/07/2017 Elsevier Patient Education  2020 Elsevier Inc.  

## 2019-02-07 NOTE — MAU Note (Signed)
3 days ago started having a lot of swelling in lower legs and feet, does reduce some at night, but still swollen.  Hands started swelling yesterday, feels like they are asleep, numb and tingly. Denies HA, visual changes today, or epigastric pain.

## 2019-02-07 NOTE — MAU Provider Note (Signed)
Chief Complaint:  Foot Swelling and hands swollen   First Provider Initiated Contact with Patient 02/07/19 1255     HPI: Kristie Crawford is a 18 y.o. G1P0 at [redacted]w[redacted]d who presents to maternity admissions reporting swelling of bilateral lower extremities and tingling in bilateral hands for 3 days.  Symptoms started after a [place] car ride.  States that she called her OB/GYN office and was instructed to come to MAU.  Pedal edema Improved significantly since last night.  Associated signs and symptoms: Negative for calf pain or warmth or shortness of breath.  Tingling of hands is bilateral and intermittent and occurs on the side that she is sleeping.  Tingling resolves within a few minutes after changing positions.  No tingling now. Associated symptoms: Negative for weakness, difficulties with speech or gait..   Denies contractions, leakage of fluid or vaginal bleeding. Good fetal movement.    Past Medical History:  Diagnosis Date  . Ovarian cyst   . Recurrent tonsillitis 09/2017   current antibiotic, will finish 09/25/2017   OB History  Gravida Para Term Preterm AB Living  1            SAB TAB Ectopic Multiple Live Births               # Outcome Date GA Lbr Len/2nd Weight Sex Delivery Anes PTL Lv  1 Current            Past Surgical History:  Procedure Laterality Date  . TONSILLECTOMY N/A 10/01/2017   Procedure: TONSILLECTOMY;  Surgeon: Izora Gala, MD;  Location: [facility];  Service: ENT;  Laterality: N/A;  . WISDOM TOOTH EXTRACTION  10/2014   Family History  Problem Relation Age of Onset  . Cancer Mother   . Cancer Maternal Aunt   . Cancer Maternal Uncle   . Cancer Maternal Grandmother   . Stroke Maternal Grandmother   . Diabetes Maternal Grandmother    Social History   Tobacco Use  . Smoking status: Never Smoker  . Smokeless tobacco: Never Used  Substance Use Topics  . Alcohol use: No  . Drug use: No   Allergies  Allergen Reactions  . Dexamethasone  Swelling  . Ciprofloxacin Rash   Medications Prior to Admission  Medication Sig Dispense Refill Last Dose  . Prenatal Vit-Fe Fumarate-FA (PRENATAL MULTIVITAMIN) TABS tablet Take 1 tablet by mouth daily at 12 noon.     . [DISCONTINUED] acetaminophen (TYLENOL) 500 MG tablet Take 1,000 mg by mouth every 4 (four) hours as needed for mild pain or fever.        I have reviewed patient's Past Medical Hx, Surgical Hx, Family Hx, Social Hx, medications and allergies.   ROS:  Review of Systems  Constitutional: Negative for chills and fever.  Eyes: Negative for visual disturbance.  Respiratory: Negative for shortness of breath.   Cardiovascular: Positive for leg swelling. Negative for chest pain.  Gastrointestinal: Negative for abdominal pain.  Genitourinary: Negative for vaginal bleeding.  Musculoskeletal: Negative for gait problem.  Neurological: Positive for numbness (Intermittent hand numbness). Negative for speech difficulty, weakness and headaches.    Physical Exam   Patient Vitals for the past 24 hrs:  BP Temp Temp src Pulse Resp SpO2 Weight  02/07/19 1248 115/64 - - 89 - - -  02/07/19 1226 127/81 98.5 F (36.9 C) Oral (!) 105 17 99 % 80.7 kg   Constitutional: Well-developed, well-nourished female in no acute distress.  Cardiovascular: normal rate Respiratory: normal effort  GI: Abd soft, non-tender, gravid appropriate for gestational age.  MS: Upper extremities extremities nontender, no edema, normal ROM.  Normal sensation and strength bilaterally. Lower extremities with trace pedal edema bilaterally.  Negative Homans sign.  No cords, erythema or warmth. Neurologic: Alert and oriented x 4.  GU: Deferred  FHT:  Baseline 145 , moderate variability, accelerations present, no decelerations Contractions: None   Labs: Results for orders placed or performed during the hospital encounter of 02/07/19 (from the past 24 hour(s))  Urinalysis, Routine w reflex microscopic     Status:  Abnormal   Collection Time: 02/07/19  1:07 PM  Result Value Ref Range   Color, Urine YELLOW YELLOW   APPearance HAZY (A) CLEAR   Specific Gravity, Urine 1.016 1.005 - 1.030   pH 6.0 5.0 - 8.0   Glucose, UA NEGATIVE NEGATIVE mg/dL   Hgb urine dipstick NEGATIVE NEGATIVE   Bilirubin Urine NEGATIVE NEGATIVE   Ketones, ur NEGATIVE NEGATIVE mg/dL   Protein, ur NEGATIVE NEGATIVE mg/dL   Nitrite NEGATIVE NEGATIVE   Leukocytes,Ua NEGATIVE NEGATIVE    Imaging:  No results found.  MAU Course: Orders Placed This Encounter  Procedures  . Urinalysis, Routine w reflex microscopic  . Discharge patient   Meds ordered this encounter  Medications  . acetaminophen (TYLENOL) 500 MG tablet    Sig: Take 2 tablets (1,000 mg total) by mouth every 6 (six) hours as needed for mild pain or fever.    Dispense:  30 tablet    Refill:  0    Order Specific Question:   Supervising Provider    Answer:   Adam PhenixARNOLD, JAMES G [[id number]]    MDM: - Intermittent, positional hand numbness consistent with carpal tunnel syndrome.  Discussed comfort measures, wrist braces PRN.  -Pedal edema, resolving.  Most likely normal swelling in pregnancy exacerbated by car ride.  No evidence of DVT or preeclampsia.  Recommend elevating feet, getting out of car to walk every 1-2 hours during prolonged drives.  DVT and PE precautions.  Assessment: 1. Carpal tunnel syndrome during pregnancy   2. Pedal edema     Plan: Discharge home in stable condition.  preterm Labor precautions and fetal kick counts Follow-up Information    [facility]   Why: as scheduled or sooner as needed if symptoms worsen Contact information: [address] [place] [address] [phone]        [facility] Follow up.   Specialty: Obstetrics and Gynecology Why: as needed in pregnancy emergencies Contact information: [address] [id number] [place]  [id number] [phone]          Allergies as of 02/07/2019      Reactions   Dexamethasone Swelling   Ciprofloxacin Rash      Medication List    TAKE these medications   acetaminophen 500 MG tablet Commonly known as: TYLENOL Take 2 tablets (1,000 mg total) by mouth every 6 (six) hours as needed for mild pain or fever. What changed: when to take this   prenatal multivitamin Tabs tablet Take 1 tablet by mouth daily at 12 noon.       Katrinka BlazingSmith, [place], CNM 02/07/2019 1:17 PM

## 2019-02-21 ENCOUNTER — Encounter (HOSPITAL_COMMUNITY): Payer: Self-pay | Admitting: *Deleted

## 2019-02-21 ENCOUNTER — Inpatient Hospital Stay (HOSPITAL_COMMUNITY)
Admission: AD | Admit: 2019-02-21 | Discharge: 2019-02-21 | Disposition: A | Discharge status: Home / Self Care | Payer: Medicaid Other | Attending: Obstetrics and Gynecology | Admitting: Obstetrics and Gynecology

## 2019-02-21 ENCOUNTER — Other Ambulatory Visit: Payer: Self-pay

## 2019-02-21 DIAGNOSIS — O26893 Other specified pregnancy related conditions, third trimester: Secondary | ICD-10-CM | POA: Diagnosis not present

## 2019-02-21 DIAGNOSIS — Z3A29 29 weeks gestation of pregnancy: Secondary | ICD-10-CM | POA: Diagnosis not present

## 2019-02-21 DIAGNOSIS — Z0371 Encounter for suspected problem with amniotic cavity and membrane ruled out: Secondary | ICD-10-CM | POA: Diagnosis not present

## 2019-02-21 DIAGNOSIS — O26892 Other specified pregnancy related conditions, second trimester: Secondary | ICD-10-CM

## 2019-02-21 DIAGNOSIS — N898 Other specified noninflammatory disorders of vagina: Secondary | ICD-10-CM | POA: Diagnosis not present

## 2019-02-21 LAB — WET PREP, GENITAL
Clue Cells Wet Prep HPF POC: NONE SEEN
Sperm: NONE SEEN
Trich, Wet Prep: NONE SEEN
Yeast Wet Prep HPF POC: NONE SEEN

## 2019-02-21 LAB — URINALYSIS, ROUTINE W REFLEX MICROSCOPIC
Bilirubin Urine: NEGATIVE
Glucose, UA: NEGATIVE mg/dL
Hgb urine dipstick: NEGATIVE
Ketones, ur: NEGATIVE mg/dL
Nitrite: NEGATIVE
Protein, ur: NEGATIVE mg/dL
Specific Gravity, Urine: 1.025 (ref 1.005–1.030)
pH: 5 (ref 5.0–8.0)

## 2019-02-21 NOTE — MAU Note (Signed)
For the past 2 wks, has had parts of mucous plug come out, was white and sticky.  This morning had a lot of whitish watery d/c , then later had some green mucous. Called her OB and they said to come right here.

## 2019-02-21 NOTE — Discharge Instructions (Signed)

## 2019-02-21 NOTE — MAU Provider Note (Signed)
History     CSN: [id number]  Arrival date and time: 02/21/19 1233   First Provider Initiated Contact with Patient 02/21/19 1339      Chief Complaint  Patient presents with  . Vaginal Discharge  . Rupture of Membranes   HPI Kristie Crawford is a 18 y.o. G1P0 at 2955w1d who presents with vaginal discharge. She states she has had mucous discharge for 2 weeks that increased today. She has not seen anymore since this morning. Denies any pain or bleeding. Reports normal fetal movement.   Denies any complications in the pregnancy.  OB History    Gravida  1   Para      Term      Preterm      AB      Living        SAB      TAB      Ectopic      Multiple      Live Births              Past Medical History:  Diagnosis Date  . Ovarian cyst   . Recurrent tonsillitis 09/2017   current antibiotic, will finish 09/25/2017    Past Surgical History:  Procedure Laterality Date  . TONSILLECTOMY N/A 10/01/2017   Procedure: TONSILLECTOMY;  Surgeon: Serena Colonelosen, Jefry, MD;  Location: [facility];  Service: ENT;  Laterality: N/A;  . WISDOM TOOTH EXTRACTION  10/2014    Family History  Problem Relation Age of Onset  . Cancer Mother   . Cancer Maternal Aunt   . Cancer Maternal Uncle   . Cancer Maternal Grandmother   . Stroke Maternal Grandmother   . Diabetes Maternal Grandmother     Social History   Tobacco Use  . Smoking status: Never Smoker  . Smokeless tobacco: Never Used  Substance Use Topics  . Alcohol use: No  . Drug use: No    Allergies:  Allergies  Allergen Reactions  . Dexamethasone Swelling  . Ciprofloxacin Rash    Medications Prior to Admission  Medication Sig Dispense Refill Last Dose  . acetaminophen (TYLENOL) 500 MG tablet Take 2 tablets (1,000 mg total) by mouth every 6 (six) hours as needed for mild pain or fever. 30 tablet 0 Past Week at Kristie time  . Prenatal Vit-Fe Fumarate-FA (PRENATAL MULTIVITAMIN) TABS tablet Take 1 tablet  by mouth daily at 12 noon.   02/21/2019 at Kristie time    Review of Systems  Constitutional: Negative.  Negative for fatigue and fever.  HENT: Negative.   Respiratory: Negative.  Negative for shortness of breath.   Cardiovascular: Negative.  Negative for chest pain.  Gastrointestinal: Negative.  Negative for abdominal pain, constipation, diarrhea, nausea and vomiting.  Genitourinary: Positive for vaginal discharge. Negative for dysuria.  Neurological: Negative.  Negative for dizziness and headaches.   Physical Exam   Blood pressure 110/79, pulse (!) 107, temperature 98.8 F (37.1 C), temperature source Oral, resp. rate 16, weight 81.3 kg, last menstrual period 07/11/2018, SpO2 98 %.  Physical Exam  Nursing note and vitals reviewed. Constitutional: She is oriented to person, place, and time. She appears well-developed and well-nourished. No distress.  HENT:  Head: Normocephalic.  Eyes: Pupils are equal, round, and reactive to light.  Cardiovascular: Normal rate, regular rhythm and normal heart sounds.  Respiratory: Effort normal and breath sounds normal. No respiratory distress.  GI: Soft. Bowel sounds are normal. She exhibits no distension. There is no abdominal tenderness.  Genitourinary:  Genitourinary Comments: SSE: scant amount of mucous discharge. No pooling   Neurological: She is alert and oriented to person, place, and time.  Skin: Skin is warm and dry.  Psychiatric: She has a normal mood and affect. Her behavior is normal. Judgment and thought content normal.   Fetal Tracing:  Baseline: 130 Variability: moderate Accels: 15x15 Decels: none  Toco: none  Cervix: closed/thick/posterior   MAU Course  Procedures Results for orders placed or performed during the hospital encounter of 02/21/19 (from the past 24 hour(s))  Wet prep, genital     Status: Abnormal   Collection Time: 02/21/19  1:40 PM   Specimen: Urine, Clean Catch  Result Value Ref Range   Yeast Wet  Prep HPF POC NONE SEEN NONE SEEN   Trich, Wet Prep NONE SEEN NONE SEEN   Clue Cells Wet Prep HPF POC NONE SEEN NONE SEEN   WBC, Wet Prep HPF POC MANY (A) NONE SEEN   Sperm NONE SEEN   Urinalysis, Routine w reflex microscopic     Status: Abnormal   Collection Time: 02/21/19  2:50 PM  Result Value Ref Range   Color, Urine YELLOW YELLOW   APPearance HAZY (A) CLEAR   Specific Gravity, Urine 1.025 1.005 - 1.030   pH 5.0 5.0 - 8.0   Glucose, UA NEGATIVE NEGATIVE mg/dL   Hgb urine dipstick NEGATIVE NEGATIVE   Bilirubin Urine NEGATIVE NEGATIVE   Ketones, ur NEGATIVE NEGATIVE mg/dL   Protein, ur NEGATIVE NEGATIVE mg/dL   Nitrite NEGATIVE NEGATIVE   Leukocytes,Ua TRACE (A) NEGATIVE   RBC / HPF 0-5 0 - 5 RBC/hpf   WBC, UA 6-10 0 - 5 WBC/hpf   Bacteria, UA FEW (A) NONE SEEN   Squamous Epithelial / LPF 0-5 0 - 5   Mucus PRESENT    MDM Prenatal records from private office reviewed. Pregnancy complicated by n/a. Labs ordered and reviewed. UA Wet prep and gc/chlamydia NST reactive  Reassurance provided of normalcy of discharge in pregnancy. No evidence of ROM or infection at this time.   Assessment and Plan   1. Encounter for suspected premature rupture of amniotic membranes, with rupture of membranes not found   2. Vaginal discharge during pregnancy in third trimester   3. [redacted] weeks gestation of pregnancy    -Discharge home in stable condition -Preterm precautions discussed -Patient advised to follow-up with [facility] as scheduled for prenatal care -Patient may return to MAU as needed or if her condition were to change or worsen   [place] 02/21/2019, 1:39 PM

## 2019-02-24 LAB — GC/CHLAMYDIA PROBE AMP (~~LOC~~) NOT AT ARMC
Chlamydia: NEGATIVE
Neisseria Gonorrhea: NEGATIVE

## 2019-03-29 ENCOUNTER — Other Ambulatory Visit: Payer: Self-pay

## 2019-03-29 ENCOUNTER — Encounter (HOSPITAL_COMMUNITY): Payer: Self-pay

## 2019-03-29 ENCOUNTER — Inpatient Hospital Stay (HOSPITAL_COMMUNITY)
Admission: AD | Admit: 2019-03-29 | Discharge: 2019-04-04 | DRG: 787 | Disposition: A | Discharge status: Home / Self Care | Payer: Medicaid Other | Attending: Obstetrics and Gynecology | Admitting: Obstetrics and Gynecology

## 2019-03-29 DIAGNOSIS — O9912 Other diseases of the blood and blood-forming organs and certain disorders involving the immune mechanism complicating childbirth: Secondary | ICD-10-CM | POA: Diagnosis present

## 2019-03-29 DIAGNOSIS — R0602 Shortness of breath: Secondary | ICD-10-CM

## 2019-03-29 DIAGNOSIS — R040 Epistaxis: Secondary | ICD-10-CM | POA: Diagnosis not present

## 2019-03-29 DIAGNOSIS — O1424 HELLP syndrome, complicating childbirth: Secondary | ICD-10-CM | POA: Diagnosis present

## 2019-03-29 DIAGNOSIS — O26893 Other specified pregnancy related conditions, third trimester: Secondary | ICD-10-CM | POA: Diagnosis not present

## 2019-03-29 DIAGNOSIS — O9089 Other complications of the puerperium, not elsewhere classified: Secondary | ICD-10-CM | POA: Diagnosis not present

## 2019-03-29 DIAGNOSIS — Z20828 Contact with and (suspected) exposure to other viral communicable diseases: Secondary | ICD-10-CM | POA: Diagnosis present

## 2019-03-29 DIAGNOSIS — R51 Headache: Secondary | ICD-10-CM

## 2019-03-29 DIAGNOSIS — D696 Thrombocytopenia, unspecified: Secondary | ICD-10-CM

## 2019-03-29 DIAGNOSIS — O99119 Other diseases of the blood and blood-forming organs and certain disorders involving the immune mechanism complicating pregnancy, unspecified trimester: Secondary | ICD-10-CM

## 2019-03-29 DIAGNOSIS — O1420 HELLP syndrome (HELLP), unspecified trimester: Secondary | ICD-10-CM | POA: Diagnosis present

## 2019-03-29 DIAGNOSIS — Z3A34 34 weeks gestation of pregnancy: Secondary | ICD-10-CM

## 2019-03-29 DIAGNOSIS — D6959 Other secondary thrombocytopenia: Secondary | ICD-10-CM | POA: Diagnosis present

## 2019-03-29 DIAGNOSIS — Z888 Allergy status to other drugs, medicaments and biological substances status: Secondary | ICD-10-CM

## 2019-03-29 LAB — COMPREHENSIVE METABOLIC PANEL
ALT: 14 U/L (ref 0–44)
AST: 18 U/L (ref 15–41)
Albumin: 2.1 g/dL — ABNORMAL LOW (ref 3.5–5.0)
Alkaline Phosphatase: 160 U/L — ABNORMAL HIGH (ref 38–126)
Anion gap: 8 (ref 5–15)
BUN: 10 mg/dL (ref 6–20)
CO2: 21 mmol/L — ABNORMAL LOW (ref 22–32)
Calcium: 8 mg/dL — ABNORMAL LOW (ref 8.9–10.3)
Chloride: 109 mmol/L (ref 98–111)
Creatinine, Ser: 0.77 mg/dL (ref 0.44–1.00)
GFR calc Af Amer: >60 mL/min (ref >60)
GFR calc non Af Amer: >60 mL/min (ref >60)
Glucose, Bld: 89 mg/dL (ref 70–99)
Potassium: 4 mmol/L (ref 3.5–5.1)
Sodium: 138 mmol/L (ref 135–145)
Total Bilirubin: 0.5 mg/dL (ref 0.3–1.2)
Total Protein: 4.7 g/dL — ABNORMAL LOW (ref 6.5–8.1)

## 2019-03-29 LAB — PROTEIN / CREATININE RATIO, URINE
Creatinine, Urine: 199.17 mg/dL
Protein Creatinine Ratio: 9.96 mg/mg{Cre} — ABNORMAL HIGH (ref 0.00–0.15)
Total Protein, Urine: 1984 mg/dL

## 2019-03-29 LAB — URINALYSIS, ROUTINE W REFLEX MICROSCOPIC
Bilirubin Urine: NEGATIVE
Glucose, UA: NEGATIVE mg/dL
Ketones, ur: NEGATIVE mg/dL
Leukocytes,Ua: NEGATIVE
Nitrite: NEGATIVE
Protein, ur: >=300 mg/dL — AB
Specific Gravity, Urine: 1.027 (ref 1.005–1.030)
pH: 5 (ref 5.0–8.0)

## 2019-03-29 LAB — CBC
HCT: 32.9 % — ABNORMAL LOW (ref 36.0–46.0)
Hemoglobin: 10.9 g/dL — ABNORMAL LOW (ref 12.0–15.0)
MCH: 28.3 pg (ref 26.0–34.0)
MCHC: 33.1 g/dL (ref 30.0–36.0)
MCV: 85.5 fL (ref 80.0–100.0)
Platelets: 89 10*3/uL — ABNORMAL LOW (ref 150–400)
RBC: 3.85 MIL/uL — ABNORMAL LOW (ref 3.87–5.11)
RDW: 14 % (ref 11.5–15.5)
WBC: 11.6 10*3/uL — ABNORMAL HIGH (ref 4.0–10.5)
nRBC: 0 % (ref 0.0–0.2)

## 2019-03-29 LAB — SARS CORONAVIRUS 2 BY RT PCR (HOSPITAL ORDER, PERFORMED IN ~~LOC~~ HOSPITAL LAB): SARS Coronavirus 2: NEGATIVE

## 2019-03-29 LAB — TYPE AND SCREEN
ABO/RH(D): B POS
Antibody Screen: NEGATIVE

## 2019-03-29 LAB — PLATELET COUNT: Platelets: 93 10*3/uL — ABNORMAL LOW (ref 150–400)

## 2019-03-29 LAB — ABO/RH: ABO/RH(D): B POS

## 2019-03-29 LAB — GROUP B STREP BY PCR: Group B strep by PCR: NEGATIVE

## 2019-03-29 MED ORDER — MISOPROSTOL 25 MCG QUARTER TABLET
25.0000 ug | ORAL_TABLET | ORAL | Status: AC | PRN
  Administered 2019-03-29 – 2019-03-30 (×3): 25 ug via VAGINAL
  Filled 2019-03-29 (×3): qty 1

## 2019-03-29 MED ORDER — ACETAMINOPHEN 325 MG PO TABS: 650.0000 mg | ORAL_TABLET | ORAL | Status: DC | PRN

## 2019-03-29 MED ORDER — SOD CITRATE-CITRIC ACID 500-334 MG/5ML PO SOLN
30.0000 mL | ORAL | Status: DC | PRN
  Administered 2019-03-31: 30 mL via ORAL
  Filled 2019-03-29: qty 30

## 2019-03-29 MED ORDER — OXYTOCIN 40 UNITS IN NORMAL SALINE INFUSION - SIMPLE MED: 2.5000 [IU]/h | Freq: Once | INTRAVENOUS | Status: DC | PRN

## 2019-03-29 MED ORDER — CALCIUM CARBONATE ANTACID 500 MG PO CHEW: 2.0000 | CHEWABLE_TABLET | ORAL | Status: DC | PRN

## 2019-03-29 MED ORDER — LACTATED RINGERS IV SOLN: 500.0000 mL | INTRAVENOUS | Status: DC | PRN

## 2019-03-29 MED ORDER — DIPHENHYDRAMINE HCL 50 MG/ML IJ SOLN
12.5000 mg | Freq: Once | INTRAMUSCULAR | Status: AC
  Administered 2019-03-29: 12.5 mg via INTRAVENOUS

## 2019-03-29 MED ORDER — OXYTOCIN BOLUS FROM INFUSION: 500.0000 mL | Freq: Once | INTRAVENOUS | Status: DC | PRN

## 2019-03-29 MED ORDER — PRENATAL MULTIVITAMIN CH: 1.0000 | ORAL_TABLET | Freq: Every day | ORAL | Status: DC

## 2019-03-29 MED ORDER — BUTALBITAL-APAP-CAFFEINE 50-325-40 MG PO TABS
2.0000 | ORAL_TABLET | Freq: Once | ORAL | Status: AC
  Administered 2019-03-29: 2 via ORAL
  Filled 2019-03-29: qty 2

## 2019-03-29 MED ORDER — DIPHENHYDRAMINE HCL 25 MG PO CAPS
25.0000 mg | ORAL_CAPSULE | Freq: Once | ORAL | Status: AC
  Administered 2019-03-29: 25 mg via ORAL
  Filled 2019-03-29: qty 1

## 2019-03-29 MED ORDER — DIPHENHYDRAMINE HCL 50 MG/ML IJ SOLN
INTRAMUSCULAR | Status: AC
  Administered 2019-03-29: 12.5 mg via INTRAVENOUS
  Filled 2019-03-29: qty 1

## 2019-03-29 MED ORDER — ONDANSETRON HCL 4 MG/2ML IJ SOLN
4.0000 mg | Freq: Four times a day (QID) | INTRAMUSCULAR | Status: DC | PRN
  Administered 2019-03-30 (×2): 4 mg via INTRAVENOUS
  Filled 2019-03-29 (×3): qty 2

## 2019-03-29 MED ORDER — ZOLPIDEM TARTRATE 5 MG PO TABS
5.0000 mg | ORAL_TABLET | Freq: Every evening | ORAL | Status: DC | PRN
  Administered 2019-03-29: 5 mg via ORAL
  Filled 2019-03-29: qty 1

## 2019-03-29 MED ORDER — MAGNESIUM SULFATE BOLUS VIA INFUSION
4.0000 g | Freq: Once | INTRAVENOUS | Status: AC
  Administered 2019-03-29: 4 g via INTRAVENOUS
  Filled 2019-03-29: qty 500

## 2019-03-29 MED ORDER — LACTATED RINGERS IV SOLN
INTRAVENOUS | Status: DC
  Administered 2019-03-29 – 2019-03-30 (×2): via INTRAVENOUS

## 2019-03-29 MED ORDER — LIDOCAINE HCL (PF) 1 % IJ SOLN: 30.0000 mL | INTRAMUSCULAR | Status: DC | PRN

## 2019-03-29 MED ORDER — ACETAMINOPHEN 325 MG PO TABS
650.0000 mg | ORAL_TABLET | ORAL | Status: DC | PRN
  Administered 2019-03-30: 650 mg via ORAL
  Filled 2019-03-29: qty 2

## 2019-03-29 MED ORDER — TERBUTALINE SULFATE 1 MG/ML IJ SOLN: 0.2500 mg | Freq: Once | INTRAMUSCULAR | Status: DC | PRN

## 2019-03-29 MED ORDER — MAGNESIUM SULFATE 40 G IN LACTATED RINGERS - SIMPLE
2.0000 g/h | INTRAVENOUS | Status: AC
  Administered 2019-03-29 – 2019-03-31 (×3): 2 g/h via INTRAVENOUS
  Filled 2019-03-29 (×2): qty 500

## 2019-03-29 MED ORDER — DOCUSATE SODIUM 100 MG PO CAPS: 100.0000 mg | ORAL_CAPSULE | Freq: Every day | ORAL | Status: DC

## 2019-03-29 NOTE — Anesthesia Preprocedure Evaluation (Addendum)
Anesthesia Evaluation  Patient identified by MRN, date of birth, ID band Patient awake    Reviewed: Allergy & Precautions, NPO status , Patient's Chart, lab work & pertinent test results  History of Anesthesia Complications Negative for: history of anesthetic complications  Airway Mallampati: III  TM Distance: >3 FB Neck ROM: Full    Dental  (+) Teeth Intact   Pulmonary neg pulmonary ROS,    breath sounds clear to auscultation       Cardiovascular negative cardio ROS   Rhythm:Regular     Neuro/Psych negative neurological ROS  negative psych ROS   GI/Hepatic negative GI ROS, Neg liver ROS,   Endo/Other  negative endocrine ROS  Renal/GU negative Renal ROS     Musculoskeletal negative musculoskeletal ROS (+)   Abdominal (+) + obese,   Peds  Hematology negative hematology ROS (+)   Anesthesia Other Findings Preeclampsia HELLP Syndrome  Reproductive/Obstetrics (+) Pregnancy                            Anesthesia Physical  Anesthesia Plan  ASA: III and emergent  Anesthesia Plan: Epidural   Post-op Pain Management:    Induction:   PONV Risk Score and Plan: 4 or greater  Airway Management Planned: Natural Airway  Additional Equipment: None  Intra-op Plan:   Post-operative Plan:   Informed Consent: I have reviewed the patients History and Physical, chart, labs and discussed the procedure including the risks, benefits and alternatives for the proposed anesthesia with the patient or authorized representative who has indicated his/her understanding and acceptance.       Plan Discussed with: CRNA and Surgeon  Anesthesia Plan Comments: (C/Section for worsening HELLP syndrome. Will use epidural for C/Section. M. Royce Macadamia, MD)     Anesthesia Quick Evaluation

## 2019-03-29 NOTE — Anesthesia Procedure Notes (Signed)
Epidural Patient location during procedure: OB Start time: 03/29/2019 8:41 PM End time: 03/29/2019 8:56 PM  Staffing Anesthesiologist: Lynda Rainwater, MD Performed: anesthesiologist   Preanesthetic Checklist Completed: patient identified, site marked, surgical consent, pre-op evaluation, timeout performed, IV checked, risks and benefits discussed and monitors and equipment checked  Epidural Patient position: sitting Prep: ChloraPrep Patient monitoring: heart rate, cardiac monitor, continuous pulse ox and blood pressure Approach: midline Location: L2-L3 Injection technique: LOR saline  Needle:  Needle type: Tuohy  Needle gauge: 17 G Needle length: 9 cm Needle insertion depth: 7 cm Catheter type: closed end flexible Catheter size: 20 Guage Catheter at skin depth: 11 cm Test dose: negative  Assessment Events: blood not aspirated, injection not painful, no injection resistance, negative IV test and no paresthesia  Additional Notes Reason for block:procedure for pain

## 2019-03-29 NOTE — Consult Note (Signed)
Neonatology Consult to Antenatal Patient:  I was asked by Dr. Ouida Sills to see this patient in order to provide antenatal counseling due to prematurity.  Kristie Crawford was admitted 03/29/19 at 34.[redacted] weeks GA for IOL due to maternal concerns for HELLP. She is currently not having active labor nor is ruptured. She is not getting BMZ due to a contraindication but is getting IV Magnesium.  I spoke with the patient. We discussed the worst case of delivery in the next 1-2 days, including usual DR management, possible respiratory complications and need for support, IV access, feedings (mother desires breast feeding and formula, which BF was encouraged and she is open to use of donor BM), LOS, Mortality and Morbidity, and Bozich term outcomes. She did have any questions at this time.  I would be glad to come back if she or father of baby has more questions later.  Thank you for asking me to see this patient.  Kristie Sabal Katherina Mires, MD Neonatologist  The total length of face-to-face or floor/unit time for this encounter was 25 minutes. Counseling and/or coordination of care was 40 minutes of the above.

## 2019-03-29 NOTE — H&P (Signed)
Kristie FoleyKristen Irene Crawford is an 18 y.o. G1P0 4324w2d white female who called the office this am stating that she was having SOB and some discomfort in chest when laying down. She also reported a two day h.o. dark stools. She was recently in the office and dxd with PUPPS after she c/o itching on abd. Her preg has otherwise been uncomplicated. While in the ER she also c/o a headache and her B/P was elevated. She was noted to have a plt ct of 89K and a very abnl Protein/Cr ratio. The plan was to admit for administration of steroids and then begin an induction for HELLP however she is allergic to steroids. She had a significant reaction with facial swelling. Per pharmacy there is a high likelihood that betamethasone will behave like dexamethasone. She has no RUQ pain and nl LFTs. Her pulse ox  is normal and she is not noticably SOB  Past Medical History:  Diagnosis Date  . Ovarian cyst   . Recurrent tonsillitis 09/2017   current antibiotic, will finish 09/25/2017    Past Surgical History:  Procedure Laterality Date  . TONSILLECTOMY N/A 10/01/2017   Procedure: TONSILLECTOMY;  Surgeon: Serena Colonelosen, Jefry, MD;  Location: [facility];  Service: ENT;  Laterality: N/A;  . WISDOM TOOTH EXTRACTION  10/2014    Family History  Problem Relation Age of Onset  . Cancer Mother   . Cancer Maternal Aunt   . Cancer Maternal Uncle   . Cancer Maternal Grandmother   . Stroke Maternal Grandmother   . Diabetes Maternal Grandmother    Social History:  reports that she has never smoked. She has never used smokeless tobacco. She reports that she does not drink alcohol or use drugs.  Allergies:  Allergies  Allergen Reactions  . Dexamethasone Swelling  . Ciprofloxacin Rash    Medications Prior to Admission  Medication Sig Dispense Refill  . acetaminophen (TYLENOL) 500 MG tablet Take 2 tablets (1,000 mg total) by mouth every 6 (six) hours as needed for mild pain or fever. 30 tablet 0  . Prenatal Vit-Fe Fumarate-FA  (PRENATAL MULTIVITAMIN) TABS tablet Take 1 tablet by mouth daily at 12 noon.         Blood pressure (!) 153/103, pulse 85, temperature 98.4 F (36.9 C), temperature source Oral, resp. rate 18, height 5\' 2"  (1.575 m), weight 93.5 kg, last menstrual period 07/11/2018, SpO2 96 %. General appearance: alert and cooperative Lungs-clear Abd- gravid, no RUQ tenderness No rebound or guarding Exts- 2+ edema, DTRs 3+ no clonus  Lab Results  Component Value Date   WBC 11.6 (H) 03/29/2019   HGB 10.9 (L) 03/29/2019   HCT 32.9 (L) 03/29/2019   MCV 85.5 03/29/2019   PLT 89 (L) 03/29/2019   Lab Results  Component Value Date   PREGTESTUR NEGATIVE 08/16/2018   HCG >2,000.0 (H) 09/29/2018      Patient Active Problem List   Diagnosis Date Noted  . Gestational thrombocytopenia (HCC) 03/29/2019  . History of dental surgery-wisdom teeth extraction 01/03/2015  . Facial abscess 01/02/2015  . Trismus   . Allergic rhinitis, seasonal 07/28/2014   IMP/ IUP at 34 wks with HELLP, several b/ps in severe range         Unable to use steroids due to allergy Plan/ Discussed case with MFM. Will start mag and then induction.            Place epi early           Recheck labs  Land O'LakesDERSON,Adlean Hardeman  E 03/29/2019, 5:17 PM

## 2019-03-29 NOTE — Progress Notes (Signed)
Pt. Previous platelets upon admission were noted to be 89 @ 1130.  Anesthesia paged @ 1930 and discussed plan for pt. In regard to pt. Pain control once in active labor.  Stat platelet count was ordered by Dr. Sabra Heck in order to formulate a plan to discuss with pt.  Platelets results came back to be 93. Dr. Sabra Heck made aware of results @ 2030.  He arrived @ bedside @ 2040 to discuss POC with patient.  Pt. Decided to proceed with placing a Dry epidural cather per Dr. Ammie Ferrier recommendation.    Procedure started @ 2044 and cath was placed @ 2050.  Will continue to monitor pt as her induction process progresses and will page Anesthesia when ready for epidural medication.  Marylou Mccoy, RN

## 2019-03-29 NOTE — MAU Provider Note (Addendum)
Patient Kristie Crawford is a 18 y.o. G1P0 At 166w2d here with complaints of shortness of breath, black stools. She also complains of headache and chest pain.  She denies vaginal bleeding, decreased fetal movements, vaginal discharge, NV, COvid-19 like symptoms.  Patient is tearful in MAU, stating "I just don't feel good". Her primary symptoms (HA, shortness of breath) started 3 days ago when she started taking Vistaril for PUPP.  History     CSN: [id number]  Arrival date and time: 03/29/19 [id number]   First Provider Initiated Contact with Patient 03/29/19 1048      Chief Complaint  Patient presents with  . Dark stool  . Shortness of Breath   Shortness of Breath Episode onset: past three days. Episode frequency: off and on, usually in the evenings or after exertion. Associated symptoms include headaches. Pertinent negatives include no fever or vomiting.  Headache  This is a new problem. Episode onset: started 3 days ago. Pain location: behind her right eye and the back of her head. The quality of the pain is described as squeezing and pulsating. The pain is at a severity of 6/10. Pertinent negatives include no fever or vomiting. She has tried acetaminophen for the symptoms. The treatment provided mild relief. There is no history of migraine headaches.   She denies history  of asthma.   She had a nose bleed at 5:30 this morning, and then her head pressure felt better. She denies blurry vision or floating spots right now, but she has noticed these in the past when she changes position or when she hasn't had enough to drink.   She started having facial swelling in her eyelids as soon as she started taking her Vistaril. She denies all over body swelling. The eyelid swelling is all the time. It started on her right side and has now moved to her left eye. She took Benadryl (1 tab) this morning, which helped with the breathing but did not change her eyelid swelling.   OB History    Gravida  1   Para      Term      Preterm      AB      Living        SAB      TAB      Ectopic      Multiple      Live Births              Past Medical History:  Diagnosis Date  . Ovarian cyst   . Recurrent tonsillitis 09/2017   current antibiotic, will finish 09/25/2017    Past Surgical History:  Procedure Laterality Date  . TONSILLECTOMY N/A 10/01/2017   Procedure: TONSILLECTOMY;  Surgeon: Serena Colonelosen, Jefry, MD;  Location: [facility];  Service: ENT;  Laterality: N/A;  . WISDOM TOOTH EXTRACTION  10/2014    Family History  Problem Relation Age of Onset  . Cancer Mother   . Cancer Maternal Aunt   . Cancer Maternal Uncle   . Cancer Maternal Grandmother   . Stroke Maternal Grandmother   . Diabetes Maternal Grandmother     Social History   Tobacco Use  . Smoking status: Never Smoker  . Smokeless tobacco: Never Used  Substance Use Topics  . Alcohol use: No  . Drug use: No    Allergies:  Allergies  Allergen Reactions  . Dexamethasone Swelling  . Ciprofloxacin Rash    Medications Prior to Admission  Medication Sig Dispense Refill Last  Dose  . acetaminophen (TYLENOL) 500 MG tablet Take 2 tablets (1,000 mg total) by mouth every 6 (six) hours as needed for mild pain or fever. 30 tablet 0   . Prenatal Vit-Fe Fumarate-FA (PRENATAL MULTIVITAMIN) TABS tablet Take 1 tablet by mouth daily at 12 noon.       Review of Systems  Constitutional: Negative for fever.  Eyes:       Swollen eyelids  Respiratory: Positive for shortness of breath.   Gastrointestinal: Negative for vomiting.  Genitourinary: Negative.   Neurological: Positive for headaches.   Physical Exam   Blood pressure (!) 156/103, pulse 72, temperature 98.4 F (36.9 C), temperature source Oral, resp. rate (!) 22, height 5\' 2"  (1.575 m), weight 93.5 kg, last menstrual period 07/11/2018, SpO2 96 %.  Patient Vitals for the past 24 hrs:  BP Temp Temp src Pulse Resp SpO2 Height Weight  03/29/19  1504 (!) 156/103 98.4 F (36.9 C) Oral 72 (!) 22 96 % - -  03/29/19 1430 (!) 153/96 - - 88 - 99 % - -  03/29/19 1416 (!) 134/108 - - 87 - - - -  03/29/19 1401 131/83 - - 70 - - - -  03/29/19 1346 (!) 134/94 - - 72 - - - -  03/29/19 1331 119/76 - - 70 - - - -  03/29/19 1316 116/68 - - 66 - - - -  03/29/19 1301 116/75 - - 75 - - - -  03/29/19 1246 119/66 - - 69 - - - -  03/29/19 1231 134/89 - - 68 - - - -  03/29/19 1216 139/74 - - 75 - - - -  03/29/19 1200 (!) 147/94 - - 79 - 99 % - -  03/29/19 1146 (!) 145/95 - - 77 - - - -  03/29/19 1131 (!) 142/95 - - 92 - 99 % - -  03/29/19 1116 (!) 147/94 - - 74 - - - -  03/29/19 1059 (!) 156/101 - - 81 - - - -  03/29/19 1045 (!) 148/95 - - 86 - 99 % - -  03/29/19 1022 (!) 157/93 98.4 F (36.9 C) Oral 84 (!) 24 100 % - -  03/29/19 1017 - - - - - - 5\' 2"  (1.575 m) 93.5 kg     Physical Exam  Constitutional: She is oriented to person, place, and time. She appears well-developed.  HENT:  Head: Normocephalic.  Neck: Normal range of motion.  Respiratory: Effort normal.  GI: Soft.  Musculoskeletal: Normal range of motion.  Neurological: She is alert and oriented to person, place, and time.  Skin: Skin is warm and dry.    MAU Course  Procedures  MDM -patients BP is elevated, will draw pre-e labs.  -will try Fioricet for HA, try to discern if this is pre-e headache or not, although HA relief does not rule out pre-e -will give another dose of Benadryl to see if it helps with the facial swelling.  1234: patient now feeling better after fioricet; HA is completely gone. Periorbital edema still present. Patient denies blurry vision or floating spots at this time.   Protein creatinine ratio is 9.9, creatine is at the upper end of normal (0.77).  -Now with gestational thrombocytopenia based on platelets of 89,000.  -Reviewed labs with Dr. Elly Modena, as well as patient's physical symptoms; Dr. Elly Modena recommends admission  and observation.   Left  voicemail for Dr. Ouida Sills around 1355 and 1415.   NST: 140 bpm, mod var, present  acel, neg decels, no contractions.   Assessment and Plan  Spoke with Dr. Dareen PianoAnderson at [phone], who agrees with plan for admission to antepartum and repeat labs.  -Will order MFM consult.  -Reviewed patient's allergy to dexamethasone; patient's mother says that she has severe facial swelling and turns red after receiving Dexamethasone 4 years ago.  -MFM to advise on whether or not to give steroids, given risk v. benefit to patient and fetus.    -Plan of care explained to patient, who agrees. FOB at the bedside.   Kristie Crawford     Kristie Crawford 03/29/2019, 3:36 PM

## 2019-03-29 NOTE — MAU Note (Addendum)
Kristie Crawford is a 18 y.o. at [redacted]w[redacted]d here in MAU reporting: started medication for PUPPs rash 3 days ago and since then she feels SOB at night and black stool and bloody nose and states her eyelids are swollen. Took benadryl this AM and states this helped with the swelling. Reports no contractions, no bleeding, no LOF. +FM. Reports pressure in her headache and seeing some spots in her vision  Onset of complaint: 3 days  Pain score: 0/10  Vitals:   03/29/19 1022  BP: (!) 157/93  Pulse: 84  Resp: (!) 24  Temp: 98.4 F (36.9 C)  SpO2: 100%     FHT: 140  Lab orders placed from triage: UA

## 2019-03-29 NOTE — MAU Note (Signed)
covid swab collected

## 2019-03-30 ENCOUNTER — Inpatient Hospital Stay (HOSPITAL_COMMUNITY): Payer: Medicaid Other | Admitting: Anesthesiology

## 2019-03-30 LAB — CBC
HCT: 32.6 % — ABNORMAL LOW (ref 36.0–46.0)
HCT: 34.9 % — ABNORMAL LOW (ref 36.0–46.0)
Hemoglobin: 10.8 g/dL — ABNORMAL LOW (ref 12.0–15.0)
Hemoglobin: 11.6 g/dL — ABNORMAL LOW (ref 12.0–15.0)
MCH: 28.6 pg (ref 26.0–34.0)
MCH: 28.1 pg (ref 26.0–34.0)
MCHC: 33.1 g/dL (ref 30.0–36.0)
MCHC: 33.2 g/dL (ref 30.0–36.0)
MCV: 86.2 fL (ref 80.0–100.0)
MCV: 84.5 fL (ref 80.0–100.0)
Platelets: 86 10*3/uL — ABNORMAL LOW (ref 150–400)
Platelets: 82 10*3/uL — ABNORMAL LOW (ref 150–400)
RBC: 3.78 MIL/uL — ABNORMAL LOW (ref 3.87–5.11)
RBC: 4.13 MIL/uL (ref 3.87–5.11)
RDW: 14.4 % (ref 11.5–15.5)
RDW: 14.3 % (ref 11.5–15.5)
WBC: 12 10*3/uL — ABNORMAL HIGH (ref 4.0–10.5)
WBC: 12.3 10*3/uL — ABNORMAL HIGH (ref 4.0–10.5)
nRBC: 0 % (ref 0.0–0.2)
nRBC: 0 % (ref 0.0–0.2)

## 2019-03-30 LAB — COMPREHENSIVE METABOLIC PANEL
ALT: 15 U/L (ref 0–44)
ALT: 15 U/L (ref 0–44)
AST: 18 U/L (ref 15–41)
AST: 22 U/L (ref 15–41)
Albumin: 2 g/dL — ABNORMAL LOW (ref 3.5–5.0)
Albumin: 2 g/dL — ABNORMAL LOW (ref 3.5–5.0)
Alkaline Phosphatase: 176 U/L — ABNORMAL HIGH (ref 38–126)
Alkaline Phosphatase: 195 U/L — ABNORMAL HIGH (ref 38–126)
Anion gap: 8 (ref 5–15)
Anion gap: 11 (ref 5–15)
BUN: 9 mg/dL (ref 6–20)
BUN: 9 mg/dL (ref 6–20)
CO2: 20 mmol/L — ABNORMAL LOW (ref 22–32)
CO2: 18 mmol/L — ABNORMAL LOW (ref 22–32)
Calcium: 7.4 mg/dL — ABNORMAL LOW (ref 8.9–10.3)
Calcium: 7.3 mg/dL — ABNORMAL LOW (ref 8.9–10.3)
Chloride: 104 mmol/L (ref 98–111)
Chloride: 106 mmol/L (ref 98–111)
Creatinine, Ser: 0.83 mg/dL (ref 0.44–1.00)
Creatinine, Ser: 0.92 mg/dL (ref 0.44–1.00)
GFR calc Af Amer: >60 mL/min (ref >60)
GFR calc Af Amer: >60 mL/min (ref >60)
GFR calc non Af Amer: >60 mL/min (ref >60)
GFR calc non Af Amer: >60 mL/min (ref >60)
Glucose, Bld: 87 mg/dL (ref 70–99)
Glucose, Bld: 83 mg/dL (ref 70–99)
Potassium: 4.2 mmol/L (ref 3.5–5.1)
Potassium: 4.5 mmol/L (ref 3.5–5.1)
Sodium: 132 mmol/L — ABNORMAL LOW (ref 135–145)
Sodium: 135 mmol/L (ref 135–145)
Total Bilirubin: 0.7 mg/dL (ref 0.3–1.2)
Total Bilirubin: 0.6 mg/dL (ref 0.3–1.2)
Total Protein: 4.5 g/dL — ABNORMAL LOW (ref 6.5–8.1)
Total Protein: 4.8 g/dL — ABNORMAL LOW (ref 6.5–8.1)

## 2019-03-30 LAB — RPR: RPR Ser Ql: NONREACTIVE

## 2019-03-30 LAB — MAGNESIUM: Magnesium: 7.4 mg/dL (ref 1.7–2.4)

## 2019-03-30 MED ORDER — HYDRALAZINE HCL 20 MG/ML IJ SOLN: 10.0000 mg | INTRAMUSCULAR | Status: DC | PRN

## 2019-03-30 MED ORDER — LACTATED RINGERS IV SOLN: 500.0000 mL | Freq: Once | INTRAVENOUS | Status: DC

## 2019-03-30 MED ORDER — DIPHENHYDRAMINE HCL 50 MG/ML IJ SOLN
12.5000 mg | Freq: Four times a day (QID) | INTRAMUSCULAR | Status: DC | PRN
  Administered 2019-03-30 (×2): 12.5 mg via INTRAVENOUS
  Filled 2019-03-30: qty 1

## 2019-03-30 MED ORDER — NALBUPHINE HCL 10 MG/ML IJ SOLN
2.5000 mg | INTRAMUSCULAR | Status: DC | PRN
  Administered 2019-03-30 (×2): 2.5 mg via INTRAVENOUS
  Filled 2019-03-30 (×2): qty 1

## 2019-03-30 MED ORDER — PHENYLEPHRINE 40 MCG/ML (10ML) SYRINGE FOR IV PUSH (FOR BLOOD PRESSURE SUPPORT): 80.0000 ug | PREFILLED_SYRINGE | INTRAVENOUS | Status: DC | PRN

## 2019-03-30 MED ORDER — DIPHENHYDRAMINE HCL 50 MG/ML IJ SOLN: 12.5000 mg | INTRAMUSCULAR | Status: DC | PRN

## 2019-03-30 MED ORDER — LABETALOL HCL 5 MG/ML IV SOLN: 40.0000 mg | INTRAVENOUS | Status: DC | PRN

## 2019-03-30 MED ORDER — BUTORPHANOL TARTRATE 1 MG/ML IJ SOLN
1.0000 mg | INTRAMUSCULAR | Status: DC | PRN
  Administered 2019-03-30: 1 mg via INTRAVENOUS

## 2019-03-30 MED ORDER — LABETALOL HCL 5 MG/ML IV SOLN
INTRAVENOUS | Status: AC
  Filled 2019-03-30: qty 4

## 2019-03-30 MED ORDER — EPHEDRINE 5 MG/ML INJ: 10.0000 mg | INTRAVENOUS | Status: DC | PRN

## 2019-03-30 MED ORDER — SODIUM CHLORIDE (PF) 0.9 % IJ SOLN
INTRAMUSCULAR | Status: DC | PRN
  Administered 2019-03-30: 12 mL/h via EPIDURAL

## 2019-03-30 MED ORDER — LABETALOL HCL 5 MG/ML IV SOLN
20.0000 mg | INTRAVENOUS | Status: DC | PRN
  Administered 2019-03-30: 20 mg via INTRAVENOUS

## 2019-03-30 MED ORDER — BUTORPHANOL TARTRATE 1 MG/ML IJ SOLN
INTRAMUSCULAR | Status: AC
  Filled 2019-03-30: qty 1

## 2019-03-30 MED ORDER — ONDANSETRON HCL 4 MG/2ML IJ SOLN
4.0000 mg | Freq: Once | INTRAMUSCULAR | Status: AC
  Administered 2019-03-30: 4 mg via INTRAVENOUS

## 2019-03-30 MED ORDER — DIPHENHYDRAMINE HCL 50 MG/ML IJ SOLN
INTRAMUSCULAR | Status: AC
  Filled 2019-03-30: qty 1

## 2019-03-30 MED ORDER — FENTANYL-BUPIVACAINE-NACL 0.5-0.125-0.9 MG/250ML-% EP SOLN
EPIDURAL | Status: AC
  Filled 2019-03-30: qty 250

## 2019-03-30 MED ORDER — TERBUTALINE SULFATE 1 MG/ML IJ SOLN: 0.2500 mg | Freq: Once | INTRAMUSCULAR | Status: DC | PRN

## 2019-03-30 MED ORDER — LABETALOL HCL 5 MG/ML IV SOLN: 80.0000 mg | INTRAVENOUS | Status: DC | PRN

## 2019-03-30 MED ORDER — OXYTOCIN 40 UNITS IN NORMAL SALINE INFUSION - SIMPLE MED
1.0000 m[IU]/min | INTRAVENOUS | Status: DC
  Administered 2019-03-30: 2 m[IU]/min via INTRAVENOUS
  Filled 2019-03-30: qty 1000

## 2019-03-30 MED ORDER — FENTANYL-BUPIVACAINE-NACL 0.5-0.125-0.9 MG/250ML-% EP SOLN: 12.0000 mL/h | EPIDURAL | Status: DC | PRN

## 2019-03-30 NOTE — Progress Notes (Signed)
Pt has been complaining of itching since epidural turned on.  Dr Royce Macadamia gave order for benadryl 12.5 mg q6h with one dose given.  Pt called me in to show that her aggressive scratching has led to abdominal stretch marks becoming red and swollen. Dr Royce Macadamia called and I left a message to possibly give further medication for itching.  Will continue to monitor.  Skin assessment: generalized 2+ pitting edema with red, inflamed stretch marks to abdomen, buttocks, hips, and thighs.  Red bumps noted to thighs that pt states come up when she itches.

## 2019-03-30 NOTE — Progress Notes (Signed)
Pt has received 3 cytotecs, now on pitocin 10units. Labs this am are stable. Epidural was started given pain from ctxs. FHTs with decels. Decreased reactivity from the MgSO4. Cx 20/FT/_2 vtx

## 2019-03-30 NOTE — Progress Notes (Signed)
Pt had labs repeated at 1640. LFTs wnl. Plts 82K Mag level 7.4.  She is now on pit. No significant change. Now 50/tight 1/-2 Foley placed in cx with two balloons inflated.

## 2019-03-31 ENCOUNTER — Encounter (HOSPITAL_COMMUNITY)
Admission: AD | Disposition: A | Discharge status: Home / Self Care | Payer: Self-pay | Source: Home / Self Care | Attending: Obstetrics and Gynecology

## 2019-03-31 ENCOUNTER — Encounter (HOSPITAL_COMMUNITY): Payer: Self-pay

## 2019-03-31 DIAGNOSIS — O1420 HELLP syndrome (HELLP), unspecified trimester: Secondary | ICD-10-CM | POA: Diagnosis present

## 2019-03-31 HISTORY — DX: HELLP syndrome (HELLP), unspecified trimester: O14.20

## 2019-03-31 LAB — MAGNESIUM
Magnesium: 7.9 mg/dL (ref 1.7–2.4)
Magnesium: 6.2 mg/dL (ref 1.7–2.4)

## 2019-03-31 LAB — COMPREHENSIVE METABOLIC PANEL
ALT: 15 U/L (ref 0–44)
ALT: 17 U/L (ref 0–44)
AST: 24 U/L (ref 15–41)
AST: 27 U/L (ref 15–41)
Albumin: 1.9 g/dL — ABNORMAL LOW (ref 3.5–5.0)
Albumin: 1.5 g/dL — ABNORMAL LOW (ref 3.5–5.0)
Alkaline Phosphatase: 200 U/L — ABNORMAL HIGH (ref 38–126)
Alkaline Phosphatase: 147 U/L — ABNORMAL HIGH (ref 38–126)
Anion gap: 10 (ref 5–15)
Anion gap: 8 (ref 5–15)
BUN: 8 mg/dL (ref 6–20)
BUN: 8 mg/dL (ref 6–20)
CO2: 19 mmol/L — ABNORMAL LOW (ref 22–32)
CO2: 21 mmol/L — ABNORMAL LOW (ref 22–32)
Calcium: 6.9 mg/dL — ABNORMAL LOW (ref 8.9–10.3)
Calcium: 6.6 mg/dL — ABNORMAL LOW (ref 8.9–10.3)
Chloride: 102 mmol/L (ref 98–111)
Chloride: 103 mmol/L (ref 98–111)
Creatinine, Ser: 1.02 mg/dL — ABNORMAL HIGH (ref 0.44–1.00)
Creatinine, Ser: 0.93 mg/dL (ref 0.44–1.00)
GFR calc Af Amer: >60 mL/min (ref >60)
GFR calc Af Amer: >60 mL/min (ref >60)
GFR calc non Af Amer: >60 mL/min (ref >60)
GFR calc non Af Amer: >60 mL/min (ref >60)
Glucose, Bld: 96 mg/dL (ref 70–99)
Glucose, Bld: 106 mg/dL — ABNORMAL HIGH (ref 70–99)
Potassium: 4.2 mmol/L (ref 3.5–5.1)
Potassium: 4.1 mmol/L (ref 3.5–5.1)
Sodium: 131 mmol/L — ABNORMAL LOW (ref 135–145)
Sodium: 132 mmol/L — ABNORMAL LOW (ref 135–145)
Total Bilirubin: 0.9 mg/dL (ref 0.3–1.2)
Total Bilirubin: 0.6 mg/dL (ref 0.3–1.2)
Total Protein: 4.5 g/dL — ABNORMAL LOW (ref 6.5–8.1)
Total Protein: 4 g/dL — ABNORMAL LOW (ref 6.5–8.1)

## 2019-03-31 LAB — CBC
HCT: 35.1 % — ABNORMAL LOW (ref 36.0–46.0)
HCT: 30 % — ABNORMAL LOW (ref 36.0–46.0)
Hemoglobin: 11.7 g/dL — ABNORMAL LOW (ref 12.0–15.0)
Hemoglobin: 9.9 g/dL — ABNORMAL LOW (ref 12.0–15.0)
MCH: 28.3 pg (ref 26.0–34.0)
MCH: 28.2 pg (ref 26.0–34.0)
MCHC: 33.3 g/dL (ref 30.0–36.0)
MCHC: 33 g/dL (ref 30.0–36.0)
MCV: 85 fL (ref 80.0–100.0)
MCV: 85.5 fL (ref 80.0–100.0)
Platelets: 74 10*3/uL — ABNORMAL LOW (ref 150–400)
Platelets: 59 10*3/uL — ABNORMAL LOW (ref 150–400)
RBC: 4.13 MIL/uL (ref 3.87–5.11)
RBC: 3.51 MIL/uL — ABNORMAL LOW (ref 3.87–5.11)
RDW: 14.2 % (ref 11.5–15.5)
RDW: 14.6 % (ref 11.5–15.5)
WBC: 13.2 10*3/uL — ABNORMAL HIGH (ref 4.0–10.5)
WBC: 11.1 10*3/uL — ABNORMAL HIGH (ref 4.0–10.5)
nRBC: 0 % (ref 0.0–0.2)
nRBC: 0 % (ref 0.0–0.2)

## 2019-03-31 SURGERY — Surgical Case
Anesthesia: Epidural

## 2019-03-31 MED ORDER — SENNOSIDES-DOCUSATE SODIUM 8.6-50 MG PO TABS
2.0000 | ORAL_TABLET | ORAL | Status: DC
  Administered 2019-03-31 – 2019-04-03 (×4): 2 via ORAL
  Filled 2019-03-31 (×4): qty 2

## 2019-03-31 MED ORDER — SODIUM BICARBONATE 8.4 % IV SOLN
INTRAVENOUS | Status: DC | PRN
  Administered 2019-03-31 (×4): 5 mL via EPIDURAL

## 2019-03-31 MED ORDER — FENTANYL CITRATE (PF) 100 MCG/2ML IJ SOLN
INTRAMUSCULAR | Status: AC
  Filled 2019-03-31: qty 2

## 2019-03-31 MED ORDER — MORPHINE SULFATE (PF) 0.5 MG/ML IJ SOLN
INTRAMUSCULAR | Status: DC | PRN
  Administered 2019-03-31: .15 mg via INTRATHECAL

## 2019-03-31 MED ORDER — COCONUT OIL OIL: 1.0000 "application " | TOPICAL_OIL | Status: DC | PRN

## 2019-03-31 MED ORDER — OXYTOCIN 40 UNITS IN NORMAL SALINE INFUSION - SIMPLE MED: 2.5000 [IU]/h | INTRAVENOUS | Status: AC

## 2019-03-31 MED ORDER — DEXAMETHASONE SODIUM PHOSPHATE 4 MG/ML IJ SOLN
INTRAMUSCULAR | Status: AC
  Filled 2019-03-31: qty 1

## 2019-03-31 MED ORDER — OXYTOCIN 40 UNITS IN NORMAL SALINE INFUSION - SIMPLE MED
INTRAVENOUS | Status: AC
  Filled 2019-03-31: qty 1000

## 2019-03-31 MED ORDER — MAGNESIUM SULFATE 40 G IN LACTATED RINGERS - SIMPLE
INTRAVENOUS | Status: DC | PRN
  Administered 2019-03-31: 2 g via INTRAVENOUS

## 2019-03-31 MED ORDER — OXYCODONE HCL 5 MG PO TABS
5.0000 mg | ORAL_TABLET | ORAL | Status: DC | PRN
  Administered 2019-03-31 – 2019-04-01 (×4): 10 mg via ORAL
  Filled 2019-03-31 (×4): qty 2

## 2019-03-31 MED ORDER — CEFAZOLIN SODIUM-DEXTROSE 2-4 GM/100ML-% IV SOLN
INTRAVENOUS | Status: AC
  Filled 2019-03-31: qty 100

## 2019-03-31 MED ORDER — PHENYLEPHRINE HCL-NACL 20-0.9 MG/250ML-% IV SOLN
INTRAVENOUS | Status: AC
  Filled 2019-03-31: qty 250

## 2019-03-31 MED ORDER — MORPHINE SULFATE (PF) 0.5 MG/ML IJ SOLN
INTRAMUSCULAR | Status: AC
  Filled 2019-03-31: qty 10

## 2019-03-31 MED ORDER — ONDANSETRON HCL 4 MG/2ML IJ SOLN
INTRAMUSCULAR | Status: AC
  Filled 2019-03-31: qty 2

## 2019-03-31 MED ORDER — CEFAZOLIN SODIUM-DEXTROSE 2-3 GM-%(50ML) IV SOLR
INTRAVENOUS | Status: DC | PRN
  Administered 2019-03-31: 2 g via INTRAVENOUS

## 2019-03-31 MED ORDER — FENTANYL CITRATE (PF) 100 MCG/2ML IJ SOLN
INTRAMUSCULAR | Status: DC | PRN
  Administered 2019-03-31: 15 ug via INTRATHECAL

## 2019-03-31 MED ORDER — BUPIVACAINE IN DEXTROSE 0.75-8.25 % IT SOLN
INTRATHECAL | Status: DC | PRN
  Administered 2019-03-31: 1.3 mL via INTRATHECAL

## 2019-03-31 MED ORDER — SODIUM CHLORIDE 0.9 % IR SOLN
Status: DC | PRN
  Administered 2019-03-31: 1

## 2019-03-31 MED ORDER — PHENYLEPHRINE 40 MCG/ML (10ML) SYRINGE FOR IV PUSH (FOR BLOOD PRESSURE SUPPORT)
PREFILLED_SYRINGE | INTRAVENOUS | Status: AC
  Filled 2019-03-31: qty 10

## 2019-03-31 MED ORDER — SODIUM CHLORIDE 0.9 % IV SOLN
INTRAVENOUS | Status: DC | PRN
  Administered 2019-03-31: 40 [IU] via INTRAVENOUS

## 2019-03-31 MED ORDER — NALBUPHINE HCL 10 MG/ML IJ SOLN: 5.0000 mg | Freq: Once | INTRAMUSCULAR | Status: DC | PRN

## 2019-03-31 MED ORDER — NALBUPHINE HCL 10 MG/ML IJ SOLN
5.0000 mg | INTRAMUSCULAR | Status: DC | PRN
  Administered 2019-03-31: 5 mg via INTRAVENOUS
  Filled 2019-03-31: qty 1

## 2019-03-31 MED ORDER — ZOLPIDEM TARTRATE 5 MG PO TABS: 5.0000 mg | ORAL_TABLET | Freq: Every evening | ORAL | Status: DC | PRN

## 2019-03-31 MED ORDER — ONDANSETRON HCL 4 MG/2ML IJ SOLN
INTRAMUSCULAR | Status: DC | PRN
  Administered 2019-03-31: 4 mg via INTRAVENOUS

## 2019-03-31 MED ORDER — DIPHENHYDRAMINE HCL 25 MG PO CAPS
25.0000 mg | ORAL_CAPSULE | ORAL | Status: DC | PRN
  Administered 2019-04-01 – 2019-04-03 (×5): 25 mg via ORAL
  Filled 2019-03-31 (×5): qty 1

## 2019-03-31 MED ORDER — NALBUPHINE HCL 10 MG/ML IJ SOLN: 5.0000 mg | INTRAMUSCULAR | Status: DC | PRN

## 2019-03-31 MED ORDER — SODIUM CHLORIDE 0.9 % IV SOLN
INTRAVENOUS | Status: DC | PRN
  Administered 2019-03-31: 20 ug/min via INTRAVENOUS

## 2019-03-31 MED ORDER — OXYCODONE-ACETAMINOPHEN 5-325 MG PO TABS: 1.0000 | ORAL_TABLET | ORAL | Status: DC | PRN

## 2019-03-31 MED ORDER — NALOXONE HCL 4 MG/10ML IJ SOLN
1.0000 ug/kg/h | INTRAVENOUS | Status: DC | PRN
  Filled 2019-03-31: qty 5

## 2019-03-31 MED ORDER — SIMETHICONE 80 MG PO CHEW
80.0000 mg | CHEWABLE_TABLET | ORAL | Status: DC
  Administered 2019-03-31 – 2019-04-03 (×6): 80 mg via ORAL
  Filled 2019-03-31 (×6): qty 1

## 2019-03-31 MED ORDER — TETANUS-DIPHTH-ACELL PERTUSSIS 5-2.5-18.5 LF-MCG/0.5 IM SUSP: 0.5000 mL | Freq: Once | INTRAMUSCULAR | Status: DC

## 2019-03-31 MED ORDER — PHENYLEPHRINE HCL (PRESSORS) 10 MG/ML IV SOLN
INTRAVENOUS | Status: DC | PRN
  Administered 2019-03-31: 80 ug via INTRAVENOUS

## 2019-03-31 MED ORDER — LACTATED RINGERS IV SOLN
INTRAVENOUS | Status: DC
  Administered 2019-03-31 (×2): via INTRAVENOUS

## 2019-03-31 MED ORDER — MEASLES, MUMPS & RUBELLA VAC IJ SOLR: 0.5000 mL | Freq: Once | INTRAMUSCULAR | Status: DC

## 2019-03-31 MED ORDER — DIPHENHYDRAMINE HCL 50 MG/ML IJ SOLN
12.5000 mg | INTRAMUSCULAR | Status: DC | PRN
  Administered 2019-03-31 – 2019-04-01 (×2): 12.5 mg via INTRAVENOUS
  Filled 2019-03-31 (×2): qty 1

## 2019-03-31 MED ORDER — SODIUM CHLORIDE 0.9 % IV SOLN
INTRAVENOUS | Status: DC | PRN
  Administered 2019-03-31: 04:00:00 via INTRAVENOUS

## 2019-03-31 MED ORDER — FENTANYL CITRATE (PF) 100 MCG/2ML IJ SOLN
50.0000 ug | INTRAMUSCULAR | Status: DC | PRN
  Administered 2019-03-31 (×2): 50 ug via INTRAVENOUS

## 2019-03-31 MED ORDER — LABETALOL HCL 200 MG PO TABS
200.0000 mg | ORAL_TABLET | Freq: Two times a day (BID) | ORAL | Status: DC
  Administered 2019-03-31 – 2019-04-02 (×6): 200 mg via ORAL
  Filled 2019-03-31 (×7): qty 1

## 2019-03-31 MED ORDER — PRENATAL MULTIVITAMIN CH
1.0000 | ORAL_TABLET | Freq: Every day | ORAL | Status: DC
  Administered 2019-03-31 – 2019-04-04 (×5): 1 via ORAL
  Filled 2019-03-31 (×5): qty 1

## 2019-03-31 MED ORDER — ACETAMINOPHEN 325 MG PO TABS
650.0000 mg | ORAL_TABLET | Freq: Four times a day (QID) | ORAL | Status: DC | PRN
  Administered 2019-03-31 – 2019-04-02 (×7): 650 mg via ORAL
  Filled 2019-03-31 (×8): qty 2

## 2019-03-31 SURGICAL SUPPLY — 27 items
BENZOIN TINCTURE PRP APPL 2/3 (GAUZE/BANDAGES/DRESSINGS) ×1 IMPLANT
CHLORAPREP W/TINT 26ML (MISCELLANEOUS) ×2 IMPLANT
CLAMP CORD UMBIL (MISCELLANEOUS) IMPLANT
CLOTH BEACON ORANGE TIMEOUT ST (SAFETY) ×2 IMPLANT
DRSG OPSITE POSTOP 4X10 (GAUZE/BANDAGES/DRESSINGS) ×2 IMPLANT
ELECT REM PT RETURN 9FT ADLT (ELECTROSURGICAL) ×2
ELECTRODE REM PT RTRN 9FT ADLT (ELECTROSURGICAL) ×1 IMPLANT
EXTRACTOR VACUUM M CUP 4 TUBE (SUCTIONS) IMPLANT
GLOVE BIOGEL PI IND STRL 7.0 (GLOVE) ×1 IMPLANT
GLOVE BIOGEL PI INDICATOR 7.0 (GLOVE) ×1
GLOVE ECLIPSE 7.0 STRL STRAW (GLOVE) ×4 IMPLANT
GOWN STRL REUS W/TWL LRG LVL3 (GOWN DISPOSABLE) ×4 IMPLANT
HOVERMATT SINGLE USE (MISCELLANEOUS) ×1 IMPLANT
KIT ABG SYR 3ML LUER SLIP (SYRINGE) IMPLANT
NDL HYPO 25X5/8 SAFETYGLIDE (NEEDLE) IMPLANT
NEEDLE HYPO 25X5/8 SAFETYGLIDE (NEEDLE) IMPLANT
NS IRRIG 1000ML POUR BTL (IV SOLUTION) ×2 IMPLANT
PACK C SECTION WH (CUSTOM PROCEDURE TRAY) ×2 IMPLANT
PAD OB MATERNITY 4.3X12.25 (PERSONAL CARE ITEMS) ×2 IMPLANT
STRIP CLOSURE SKIN 1/2X4 (GAUZE/BANDAGES/DRESSINGS) ×1 IMPLANT
SUT MNCRL 0 VIOLET CTX 36 (SUTURE) ×3 IMPLANT
SUT MON AB 2-0 CT1 27 (SUTURE) ×4 IMPLANT
SUT MONOCRYL 0 CTX 36 (SUTURE) ×3
SUT PLAIN 0 NONE (SUTURE) IMPLANT
TOWEL OR 17X24 6PK STRL BLUE (TOWEL DISPOSABLE) ×2 IMPLANT
TRAY FOLEY W/BAG SLVR 14FR LF (SET/KITS/TRAYS/PACK) IMPLANT
WATER STERILE IRR 1000ML POUR (IV SOLUTION) ×2 IMPLANT

## 2019-03-31 NOTE — Progress Notes (Signed)
Breast pump initiated with mom.

## 2019-03-31 NOTE — Progress Notes (Signed)
CRITICAL VALUE ALERT  Critical Value:  Mag 6.2   Date & Time Notied:  03/31/2019 1429  Provider Notified: Vanessa Kick, MD  Orders Received/Actions taken: no new orders

## 2019-03-31 NOTE — Anesthesia Postprocedure Evaluation (Signed)
Anesthesia Post Note  Patient: Kristie Crawford  Procedure(s) Performed: CESAREAN SECTION (N/A )     Patient location during evaluation: PACU Anesthesia Type: Spinal Level of consciousness: oriented and awake and alert Pain management: pain level controlled Vital Signs Assessment: post-procedure vital signs reviewed and stable Respiratory status: spontaneous breathing, respiratory function stable, patient connected to nasal cannula oxygen and nonlabored ventilation Cardiovascular status: blood pressure returned to baseline and stable Postop Assessment: no headache, no backache, no apparent nausea or vomiting and spinal receding Anesthetic complications: no    Last Vitals:  Vitals:   03/31/19 0430 03/31/19 0445  BP: 92/77   Pulse: 73 79  Resp: 17 12  Temp:    SpO2: 95% 97%    Last Pain:  Vitals:   03/31/19 0445  TempSrc:   PainSc: 0-No pain   Pain Goal:      LLE Sensation: Tingling (03/31/19 0430)   RLE Sensation: Tingling (03/31/19 0430) L Sensory Level: T10-Umbilical region (35/36/14 0430) R Sensory Level: T10-Umbilical region (43/15/40 0430) Epidural/Spinal Function Cutaneous sensation: No Sensation (03/31/19 0415), Patient able to flex knees: No (03/31/19 0415), Patient able to lift hips off bed: No (03/31/19 0415), Back pain beyond tenderness at insertion site: No (03/31/19 0415), Progressively worsening motor and/or sensory loss: No (03/31/19 0415)  Bani Gianfrancesco A.

## 2019-03-31 NOTE — Lactation Note (Signed)
This note was copied from a baby's chart. Lactation Consultation Note  Patient Name: Kristie Crawford ZOXWR'U Date: 20/17/2020 Reason for consult: Initial assessment;Primapara;1st time breastfeeding;NICU baby;Infant < 6lbs;Other (Comment)(teen pregnancy)  Visited with mom of a 20 hours old LPI < 5 lbs NICU female who is currently on NPO. Mom has HELLP syndrome and she's not feeling well just laying in her bed with her eyes closed but able to respond questions. [facility] asked if she could come back another time but dad volunteered to help responding some questions too.  Mom is a P64, 18 y.o and participated in the [facility] program during this pregnancy. She reported (+) breast changes during the pregnancy and has a DEBP at home. Due to mom's status unable to teach hand expression but dad is familiar with it, he said he's read "the whole book" and he's going to watch some videos too. Mom has flat nipples, once she feels well, she'll need to be set up with breast shells in addition tot he DEBP.  DEBP kit is in the room but not set up yet. LC offered to set it up so mom can start pumping anytime she's ready but she said she didn't want to pump laying there if she's not going to use it. She'll let her RN know once she's ready to start pumping. Reviewed pumping schedule and breastmilk storage guidelines with dad and types of bras to wear with shells.  Feeding plan:  1. Mom will start pumping whenever she's ready and will let her RN know to set up the DEBP kit. She's sick now. Once she does, dad is aware that the ideal schedule is every 3 hours, but this will depend on her overall status. 2. Once she starts pumping she'll take any drops of EBM available to the NICU  BF brochure, BF resources and NICU booklet were reviewed with dad. Parents reported all questions and concerns were answered, they're both aware of [facility] services and will call PRN.    Maternal Data Formula Feeding for Exclusion: Yes Reason for exclusion:  Mother's choice to formula and breast feed on admission Has patient been taught Hand Expression?: No(Mom has HELLP syndrome, unable to teach due to mom not feeling well at this point) Does the patient have breastfeeding experience prior to this delivery?: No  Feeding    Interventions Interventions: Breast feeding basics reviewed;DEBP  Lactation Tools Discussed/Used Tools: Pump Breast pump type: Double-Electric Breast Pump WIC Program: Yes Pump Review: Setup, frequency, and cleaning;Milk Storage Initiated by:: Not initated yet, kit is in the room. LC offered to set it up, but mom not ready to pump yet Date initiated:: 03/31/19   Consult Status Consult Status: PRN Date: 04/01/19 Follow-up type: In-patient    Derek Huneycutt Francene Boyers 03/31/2019, 2:00 PM

## 2019-03-31 NOTE — Anesthesia Postprocedure Evaluation (Signed)
Anesthesia Post Note  Patient: Kristie Crawford  Procedure(s) Performed: CESAREAN SECTION (N/A )     Patient location during evaluation: OB High Risk Anesthesia Type: Epidural Level of consciousness: awake and alert Pain management: pain level controlled Vital Signs Assessment: post-procedure vital signs reviewed and stable Respiratory status: spontaneous breathing, nonlabored ventilation and respiratory function stable Cardiovascular status: stable Postop Assessment: no headache, no backache and epidural receding Anesthetic complications: no    Last Vitals:  Vitals:   03/31/19 0700 03/31/19 0744  BP: (!) 157/103 (!) 151/93  Pulse: 91 87  Resp:  18  Temp:  36.8 C  SpO2:  100%    Last Pain:  Vitals:   03/31/19 0745  TempSrc:   PainSc: 9    Pain Goal: Patients Stated Pain Goal: 4 (03/31/19 0745)              Epidural/Spinal Function Cutaneous sensation: Normal sensation (03/31/19 0745), Patient able to flex knees: Yes (03/31/19 0745), Patient able to lift hips off bed: Yes (03/31/19 0745), Back pain beyond tenderness at insertion site: No (03/31/19 0745), Progressively worsening motor and/or sensory loss: No (03/31/19 0745), Bowel and/or bladder incontinence post epidural: No (03/31/19 0745)  Gilmer Mor

## 2019-03-31 NOTE — Consult Note (Signed)
Delivery Note:  C-section       03/31/2019  3:12 AM  I was called to the operating room at the request of the patient's obstetrician (Dr. Roselie Awkward) for a primary c-section.  PRENATAL HX:  This is a 18 y/o G1P0 at 73 and 4/[redacted] weeks gestation who was admitted for IOL due to HELLP.  Her pregnancy has also been complicated by PUPPS.  She has a history of a significant reaction to dexamethasone, so betamethasone was not administered.  Induction progress was slow and she started to have symptoms of worsening pre-eclampsia (blurred and double vision with scotomata), so delivery was ultimately by C-section.  She has been on magnesium during this hospitalization.  ROM at delivery.     DELIVERY:  Infant was vigorous at delivery, requiring no resuscitation other than standard warming, drying and stimulation.  A pulse oximeter was applied and O2 saturstions in low 60s.  CPAP applied at 3 minutes of age.  At 4 minutes of age PPV was given for ~30 seconds for apnea with HR drop to 90s.  HR improved and there was no further apnea.  FiO2 weaned from max of 100% to 40% at the time of NICU transfer.  APGARs 6 and 7.  Exam notable for mild hypotonia, otherwise within normal limits.     _____________________ Electronically Signed By: Clinton Gallant, MD Neonatologist

## 2019-03-31 NOTE — Anesthesia Procedure Notes (Signed)
Spinal  Patient location during procedure: OR Start time: 03/31/2019 3:19 AM End time: 03/31/2019 3:22 AM Staffing Anesthesiologist: Josephine Igo, MD Performed: anesthesiologist  Preanesthetic Checklist Completed: patient identified, site marked, surgical consent, pre-op evaluation, timeout performed, IV checked, risks and benefits discussed and monitors and equipment checked Spinal Block Patient position: sitting Prep: site prepped and draped and DuraPrep Patient monitoring: heart rate, cardiac monitor, continuous pulse ox and blood pressure Approach: midline Location: L4-5 Injection technique: single-shot Needle Needle type: Pencan  Needle gauge: 24 G Needle length: 9 cm Needle insertion depth: 7 cm Assessment Sensory level: T2 Additional Notes Patient tolerated procedure well. Adequate sensory level.

## 2019-03-31 NOTE — Progress Notes (Signed)
Subjective: Postpartum Day 0: Cesarean Delivery Patient reports pain controlled. Persistant blurred vision. Everything seems red or orange. Denies nausea. Was able to ambulate  Objective: Vital signs in last 24 hours: Temp:  [96.7 F (35.9 C)-98.7 F (37.1 C)] 98.7 F (37.1 C) (08/17 0951) Pulse Rate:  [73-106] 84 (08/17 0951) Resp:  [10-23] 18 (08/17 1045) BP: (92-158)/(45-106) 154/92 (08/17 0951) SpO2:  [95 %-100 %] 99 % (08/17 1045)  Total I/O In: 3209 [P.O.:2740; I.V.:469] Out: 1325 [Urine:1325]   Physical Exam:  General: alert, cooperative and appears stated age 18: appropriate Uterine Fundus: firm Incision: healing well DVT Evaluation: No evidence of DVT seen on physical exam.  Recent Labs    03/30/19 1642 03/31/19 0159  HGB 11.6* 11.7*  HCT 34.9* 35.1*    Assessment/Plan: Status post Cesarean section. Doing well postoperatively.  Continue current care. Check labs at 1400 BPs elevated with magnesium, will start po labetalol  Vanessa Kick 03/31/2019, 12:30 PM

## 2019-03-31 NOTE — Progress Notes (Signed)
Pt now c/o blurring vision, scotomata, double vision, hallucinations. No headache.  No change in cx. Delivery remote. Discussed with pt possible worsening of preeclampsia. Will proceed with C/S.

## 2019-03-31 NOTE — Transfer of Care (Signed)
Immediate Anesthesia Transfer of Care Note  Patient: Kristie Crawford  Procedure(s) Performed: CESAREAN SECTION (N/A )  Patient Location: PACU  Anesthesia Type:Spinal  Level of Consciousness: awake, alert  and oriented  Airway & Oxygen Therapy: Patient Spontanous Breathing  Post-op Assessment: Report given to RN and Post -op Vital signs reviewed and stable  Post vital signs: Reviewed and stable  Last Vitals:  Vitals Value Taken Time  BP    Temp    Pulse    Resp 13 03/31/19 0423  SpO2    Vitals shown include unvalidated device data.  Last Pain:  Vitals:   03/30/19 2201  TempSrc:   PainSc: Asleep         Complications: No apparent anesthesia complications

## 2019-04-01 MED ORDER — HYDROCORTISONE 1 % EX CREA
TOPICAL_CREAM | Freq: Three times a day (TID) | CUTANEOUS | Status: DC
  Administered 2019-04-01 – 2019-04-04 (×7): via TOPICAL
  Filled 2019-04-01 (×3): qty 28

## 2019-04-01 MED ORDER — TRAMADOL HCL 50 MG PO TABS
50.0000 mg | ORAL_TABLET | Freq: Four times a day (QID) | ORAL | Status: DC | PRN
  Administered 2019-04-01 – 2019-04-03 (×2): 50 mg via ORAL
  Filled 2019-04-01 (×4): qty 1

## 2019-04-01 NOTE — Op Note (Signed)
Kristie Crawford, Kristie Crawford MEDICAL RECORD [id number] ACCOUNT [id number] DATE OF BIRTH:05-Aug-2001 FACILITY: [facility] LOCATION: [place], [place]  OPERATIVE REPORT  DATE OF PROCEDURE:  03/31/2019  PREOPERATIVE DIAGNOSES: 1.  Intrauterine pregnancy at 34-1/2 weeks estimated gestational age. 2.  Worsening preeclampsia. 3.  Unfavorable cervix. 4.  Vaginal delivery not possible in the near future.  POSTOPERATIVE DIAGNOSES:  1.  Intrauterine pregnancy at 34-1/2 weeks estimated gestational age. 2.  Worsening preeclampsia. 3.  Unfavorable cervix. 4.  Vaginal delivery not possible in the near future.  PROCEDURE PERFORMED:  Primary low transverse cesarean section.  SURGEON:  Freda Munro, MD  ANESTHESIA:  Spinal.  ANTIBIOTICS:  Ancef 2 grams.  DRAINS:  Foley to bedside drainage.  ESTIMATED BLOOD LOSS:  900 mL  DESCRIPTION OF PROCEDURE:  The patient was taken to the operating room where her epidural anesthetic was reinjected.  This was not successful and therefore a spinal anesthetic was placed.  Once an adequate level of anesthesia was reached, the patient was  prepped and draped in the usual fashion for this procedure.  A Pfannenstiel incision was made 2 cm above the pubic symphysis.  On entering the abdominal cavity, the bladder flap was taken down with sharp dissection.  A low transverse uterine incision  was made in the midline with the Metzenbaum scissors and then extended laterally with blunt dissection.  Amniotic fluid was noted to be clear.  The infant was delivered in the vertex presentation.  On delivery of the head, the oropharynx and nostrils  were bulb suctioned.  The remaining infant was then delivered.  The cord was doubly clamped and cut and the infant handed to the awaiting NICU team.  Cord blood was then obtained.  The placenta was manually removed and sent to pathology.  The uterine  cavity was examined and wiped with a wet lap.  The uterine incision  was closed in a single layer of 0 Monocryl suture in a running locking fashion.  Bladder flap was closed using 2-0 Monocryl in a running fashion.  Hemostasis appeared to be adequate at  this point.  The parietal peritoneum and rectus muscles were reapproximated in the midline using 2-0 Monocryl in a running fashion.  The fascia was closed using 0 Monocryl suture in a running fashion.  Subcuticular tissue was irrigated and closed with  2-0 plain gut suture, 4-0 Vicryl suture was used in a subcuticular fashion to close the skin.  Steri-Strips were then placed.  The patient tolerated the procedure well.  She was taken to recovery room in stable condition.  Instrument and lap count was  correct x3.  TN/NUANCE  D:04/01/2019 T:04/01/2019 JOB:[id number]

## 2019-04-01 NOTE — Lactation Note (Signed)
This note was copied from a baby's chart. Lactation Consultation Note  Patient Name: Kristie Crawford AFBXU'X Date: 04/01/2019 Reason for consult: Late-preterm 34-36.6wks;Infant < 6lbs;NICU baby;1st time breastfeeding;Primapara  P1 mother whose infant is now 44 hours old.  This is a  LPTI at 34+4 weeks, weighing < 6 lbs and in the NICU.  Mother's feeding preference on admission is breast/bottle.  She will "see how it goes" before she makes a final decision on exclusively breast feeding and/or pumping.    Mother had pre eclampsia and HELLP syndrome.  She did not feel well yesterday when the [facility] met with her.  Today she is feeling better, however, her vision is still blurry.  She remains tired.  Offered to review basic breast feeding/pumping concepts with her and mother agreeable.  She had a DEBP set up in the room but no one has shown her how to use this yet.  She was too sick yesterday and did not wish to pump.  Pump parts, assembly, disassembly and cleaning reviewed.  Milk storage times discussed.  Mother's breasts are soft and non tender and nipples are short shafted and everted.  Observed mother pumping for 15 minutes while answering questions mother had regarding breast feeding.  #24 flange size is appropriate at this time.    Mother will obtain labels when she visits the NICU today.  She is currently waiting for her fiancee to return.  He had gone home to collect some belongings.  I will review basic concepts with him to be certain he understands how to best assist mother during her hospital stay.  Encouraged pumping at baby's bedside in the NICU and doing lots of STS in the NICU with both parents.  Taught mother hand expression and she will do this before/after pumping to help increase milk supply.  Encouraged her to call for any questions/concerns she may have.  She has a DEBP for home use.  She would benefit from wearing breast shells when father returns from home.  I have presented enough  information at this time and will review when he returns.  Mother appreciative of help and stated that she feels "much better" with this knowledge.   Maternal Data Formula Feeding for Exclusion: Yes Reason for exclusion: Mother's choice to formula and breast feed on admission Has patient been taught Hand Expression?: Yes Does the patient have breastfeeding experience prior to this delivery?: No  Feeding Feeding Type: Formula  LATCH Score                   Interventions    Lactation Tools Discussed/Used Breast pump type: Double-Electric Breast Pump Pump Review: Setup, frequency, and cleaning;Milk Storage Initiated by:: [name] Date initiated:: 04/01/19   Consult Status Consult Status: Follow-up Date: 04/02/19 Follow-up type: In-patient    [name] 04/01/2019, 1:18 PM

## 2019-04-01 NOTE — Progress Notes (Signed)
Patient screened out for psychosocial assessment since none of the following apply:  Psychosocial stressors documented in mother or baby's chart  Gestation less than 32 weeks  Code at delivery   Infant with anomalies Please contact the Clinical Social Worker if specific needs arise, by MOB's request, or if MOB scores greater than 9/yes to question 10 on Edinburgh Postpartum Depression Screen.  Carlethia Mesquita Willhelm, LCSW Clinical Social Worker [facility] Cell#: [phone]     

## 2019-04-01 NOTE — Progress Notes (Signed)
  Patient is eating, ambulating, voiding.  Pain control is good.  Pt without severe symptoms of preeclampsia now- vision is resolving.  Pt had bloody nose last night but was self-limited and no other bleeding this am.   Vitals:   04/01/19 0130 04/01/19 0230 04/01/19 0330 04/01/19 0440  BP:    129/82  Pulse:    93  Resp: 18 18 16 18   Temp:    98.3 F (36.8 C)  TempSrc:    Oral  SpO2:    98%  Weight:      Height:        lungs:   clear to auscultation cor:    RRR Abdomen:  soft, appropriate tenderness, incisions intact and without erythema or exudate ex:    no cords   Results for orders placed or performed during the hospital encounter of 03/29/19 (from the past 24 hour(s))  CBC     Status: Abnormal   Collection Time: 03/31/19  2:25 PM  Result Value Ref Range   WBC 11.1 (H) 4.0 - 10.5 K/uL   RBC 3.51 (L) 3.87 - 5.11 MIL/uL   Hemoglobin 9.9 (L) 12.0 - 15.0 g/dL   HCT 30.0 (L) 36.0 - 46.0 %   MCV 85.5 80.0 - 100.0 fL   MCH 28.2 26.0 - 34.0 pg   MCHC 33.0 30.0 - 36.0 g/dL   RDW 14.6 11.5 - 15.5 %   Platelets 59 (L) 150 - 400 K/uL   nRBC 0.0 0.0 - 0.2 %  Comprehensive metabolic panel     Status: Abnormal   Collection Time: 03/31/19  2:25 PM  Result Value Ref Range   Sodium 132 (L) 135 - 145 mmol/L   Potassium 4.1 3.5 - 5.1 mmol/L   Chloride 103 98 - 111 mmol/L   CO2 21 (L) 22 - 32 mmol/L   Glucose, Bld 106 (H) 70 - 99 mg/dL   BUN 8 6 - 20 mg/dL   Creatinine, Ser 0.93 0.44 - 1.00 mg/dL   Calcium 6.6 (L) 8.9 - 10.3 mg/dL   Total Protein 4.0 (L) 6.5 - 8.1 g/dL   Albumin 1.5 (L) 3.5 - 5.0 g/dL   AST 27 15 - 41 U/L   ALT 17 0 - 44 U/L   Alkaline Phosphatase 147 (H) 38 - 126 U/L   Total Bilirubin 0.6 0.3 - 1.2 mg/dL   GFR calc non Af Amer >60 >60 mL/min   GFR calc Af Amer >60 >60 mL/min   Anion gap 8 5 - 15  Magnesium     Status: Abnormal   Collection Time: 03/31/19  2:25 PM  Result Value Ref Range   Magnesium 6.2 (HH) 1.7 - 2.4 mg/dL    --/--/B POS, B POS Performed at  [facility], [address] [place], [place] [address]  (08/15 1131)/[place]  A/P    Post operative day 1 C/S and severe preeclampsia.   Platelets are low this am- HELLP.  Will recheck tomorrow.  No  bleeding currently.  Magnesium off since 5 hours ago.   BPs stable on labetalol.    Otherwise routine post op and postpartum care.  Expect d/c po day 3 or 4.  Baby in NICU for preterm.   Percocet for pain control.

## 2019-04-02 LAB — CBC
HCT: 27.5 % — ABNORMAL LOW (ref 36.0–46.0)
Hemoglobin: 8.7 g/dL — ABNORMAL LOW (ref 12.0–15.0)
MCH: 28.3 pg (ref 26.0–34.0)
MCHC: 31.6 g/dL (ref 30.0–36.0)
MCV: 89.6 fL (ref 80.0–100.0)
Platelets: 60 10*3/uL — ABNORMAL LOW (ref 150–400)
RBC: 3.07 MIL/uL — ABNORMAL LOW (ref 3.87–5.11)
RDW: 15.5 % (ref 11.5–15.5)
WBC: 12.1 10*3/uL — ABNORMAL HIGH (ref 4.0–10.5)
nRBC: 0 % (ref 0.0–0.2)

## 2019-04-02 NOTE — Clinical Social Work Maternal (Signed)
CLINICAL SOCIAL WORK MATERNAL/CHILD NOTE  Patient Details  Name: Kristie Crawford MRN: [id number] Date of Birth: 05/10/2001  Date:  04/02/2019  Clinical Social Worker Initiating Note:  Yasin Ducat Dorsi, LCSW     Date/Time: Initiated:  04/02/19/1053             Child's Name:  Kristie Crawford   Biological Parents:  Mother, Father(Father: Kristie Crawford)   Need for Interpreter:  None   Reason for Referral:  Other (Comment), Behavioral Health Concerns(Edinburgh Score 16;; NICU Admission)   Address:  [address] [place], [place] [address]   Phone number:  [phone] (home)     Additional phone number:   Household Members/Support Persons (HM/SP):   Household Member/Support Person 1   HM/SP Name Relationship DOB or Age  HM/SP -1 Kristie Crawford FOB    HM/SP -2        HM/SP -3        HM/SP -4        HM/SP -5        HM/SP -6        HM/SP -7        HM/SP -8          Natural Supports (not living in the home): Immediate Family, Other (Comment)(Mother in law; Sister)   Professional Supports:None   Employment:Unemployed   Type of Work:     Education:  High school graduate   Homebound arranged:    Financial Resources:Medicaid   Other Resources: WIC   Cultural/Religious Considerations Which May Impact Care:   Strengths: Ability to meet basic needs , Home prepared for child , Understanding of illness   Psychotropic Medications:         Pediatrician:       Pediatrician List:   [place]    [place]    [place]    [place]     [place]    [place]      Pediatrician Fax Number:    Risk Factors/Current Problems: None   Cognitive State: Able to Concentrate , Alert , Insightful , Goal Oriented , Linear Thinking    Mood/Affect: Interested , Calm , Comfortable    CSW Assessment:CSW met with MOB and FOB at bedside to discuss infant's NICU admission and to speak with MOB  about edinburgh score 16. CSW introduced self and explained reason for visit. MOB and FOB were welcoming and engaged throughout assessment. MOB reported that she resides with FOB and is currently unemployed. MOB reported that she receives WIC and has all items needed to care for infant including a car seat and crib. CSW inquired about MOB's support system, MOB reported that her mother in law and sister are her supports along with other family members.   CSW and MOB/FOB discussed infant's NICU admission. FOB reported that it has been good and that infant has good nurses. MOB and FOB reported that they feel well informed about infant's care. CSW informed MOB/FOB about the NICU, what to expect and resources/supports available while infant is admitted to the NICU. MOB and FOB denied any questions/concerns.   CSW asked FOB to leave the room to speak with MOB privately, FOB left voluntarily. CSW inquired about MOB's mental health history, MOB denied any mental health history. CSW and MOB discussed MOB's edinburgh score 16. MOB informed CSW about her emergency C-section and her birthing experience. MOB also shared about her struggle with anxiety surrounding infant's health. CSW acknowledged, validated and normalized MOB's feelings. CSW informed   MOB that having a baby in the NICU can be a difficult experience but hospital staff is available for support, concerns and needs. CSW encouraged MOB to reach out to staff as needed and to rely on her supports during this hard time. CSW inquired about how MOB is currently feeling, MOB reported that she is feeling okay but drained. CSW encouraged MOB to get rest when possible. MOB presented calm and became emotional when speaking about infant being in the NICU. MOB did not demonstrate any acute mental health signs/symptoms. CSW assessed for safety, MOB denied SI, HI and domestic violence.   CSW provided education regarding the baby blues period vs. perinatal mood disorders,  discussed treatment and gave resources for mental health follow up if concerns arise.  CSW recommends self-evaluation during the postpartum time period using the New Mom Checklist from Postpartum Progress and encouraged MOB to contact a medical professional if symptoms are noted at any time.    CSW will continue to offer support/resources while infant is admitted to the NICU.    CSW Plan/Description: Perinatal Mood and Anxiety Disorder (PMADs) Education, Other Patient/Family Education    Marche Hottenstein L Weedon, LCSW 04/02/2019, 10:56 AM           

## 2019-04-03 LAB — CBC
HCT: 27.9 % — ABNORMAL LOW (ref 36.0–46.0)
Hemoglobin: 9 g/dL — ABNORMAL LOW (ref 12.0–15.0)
MCH: 28.8 pg (ref 26.0–34.0)
MCHC: 32.3 g/dL (ref 30.0–36.0)
MCV: 89.4 fL (ref 80.0–100.0)
Platelets: 84 10*3/uL — ABNORMAL LOW (ref 150–400)
RBC: 3.12 MIL/uL — ABNORMAL LOW (ref 3.87–5.11)
RDW: 15.7 % — ABNORMAL HIGH (ref 11.5–15.5)
WBC: 16 10*3/uL — ABNORMAL HIGH (ref 4.0–10.5)
nRBC: 0.3 % — ABNORMAL HIGH (ref 0.0–0.2)

## 2019-04-03 MED ORDER — DIBUCAINE (PERIANAL) 1 % EX OINT: 1.0000 "application " | TOPICAL_OINTMENT | CUTANEOUS | Status: DC | PRN

## 2019-04-03 MED ORDER — WITCH HAZEL-GLYCERIN EX PADS: 1.0000 "application " | MEDICATED_PAD | CUTANEOUS | Status: DC | PRN

## 2019-04-03 MED ORDER — LABETALOL HCL 200 MG PO TABS
400.0000 mg | ORAL_TABLET | Freq: Two times a day (BID) | ORAL | Status: DC
  Administered 2019-04-03: 400 mg via ORAL
  Filled 2019-04-03: qty 2

## 2019-04-03 MED ORDER — LABETALOL HCL 200 MG PO TABS
600.0000 mg | ORAL_TABLET | Freq: Three times a day (TID) | ORAL | Status: DC
  Administered 2019-04-03 – 2019-04-04 (×3): 600 mg via ORAL
  Filled 2019-04-03 (×3): qty 3

## 2019-04-03 NOTE — Lactation Note (Signed)
This note was copied from a Kristie's chart. Lactation Consultation Note  Patient Name: Kristie Crawford IFOYD'X Date: 04/03/2019 Reason for consult: Follow-up assessment;Primapara;1st time breastfeeding;NICU Kristie;Infant < 6lbs;Late-preterm 34-36.6wks  1357 - 1408 - I followed up with Kristie Crawford to check on her progress with pumping. It's day four, and Kristie "[name]" is now 52 hours old. Kristie Crawford states that she is only seeing drops of colostrum when she pumps. When I probed a bit, she states that she's pumping 3 times a day.  Kristie Crawford wants to breast and bottle feed (formula and breast milk) after discharge. She has a personal DEBP at home.   I spent time educating on how frequent stimulation and breast emptying affects milk production. Kristie Crawford was very ill in the first 2 days, and I discussed how this may delay lactogenesis 2. I challenged Kristie Crawford to increase her pumping sessions to 8 times in the next 24 hour period.   Kristie Crawford is using size 24 flanges. She states that it's comfortable. We reviewed pump pressure settings. I also encouraged her to pump either while visiting [place] in the NICU or just after she returns. She states that she's noticed that it helps her milk.  I recommended that Kristie Crawford track her pumps on paper or in a phone app over the next 24 hours. I suggested that lactation could follow up tomorrow to discuss her progress and to determine next steps. She verbalized understanding.  I reminded Kristie Crawford that when [facility] shows readiness for breast feeding, she can request lactation to help with latch.   Maternal Data Formula Feeding for Exclusion: No Does the patient have breastfeeding experience prior to this delivery?: No  Feeding Feeding Type: Donor Breast Milk   Interventions Interventions: Breast feeding basics reviewed;DEBP  Lactation Tools Discussed/Used Tools: Pump Breast pump type: Double-Electric Breast Pump Pump Review: Setup, frequency, and  cleaning   Consult Status Consult Status: Follow-up Date: 04/04/19 Follow-up type: In-patient    Lenore Manner 04/03/2019, 2:36 PM

## 2019-04-03 NOTE — Progress Notes (Signed)
Patient is doing well.  She is tolerating PO, ambulating, voiding.  Pain is controlled.  Lochia is appropriate.  BPs trended up yesterday to 140-150s/90-100s.   Vitals:   04/03/19 0408 04/03/19 0850 04/03/19 0910 04/03/19 0940  BP: (!) 141/86 (!) 153/103 (!) 154/93 (!) 154/93  Pulse: 87 80 89 89  Resp: 17 18    Temp: 97.6 F (36.4 C) 98.4 F (36.9 C)    TempSrc: Oral Oral    SpO2: 100% 99% 99%   Weight:      Height:        NAD Abdomen:  soft, appropriate tenderness, incisions intact and without erythema or drainage ext:    Symmetric, 2+ edema bilaterally  Lab Results  Component Value Date   WBC 12.1 (H) 04/02/2019   HGB 8.7 (L) 04/02/2019   HCT 27.5 (L) 04/02/2019   MCV 89.6 04/02/2019   PLT 60 (L) 04/02/2019    --/--/B POS, B POS Performed at [facility], [address], [place], [place] [address]  [phone])  A/P    18 y.o. G1P0101 POD 3 s/p primary c/s in setting of HELLP Increasing BPs--will increase labetalol to 400mg  BID at this time and follow closely Platelets have appeared to nadir around 60.  Will repeat today.  Creatinine peaked at 1.02, has since trended down Will follow closely

## 2019-04-04 ENCOUNTER — Ambulatory Visit: Payer: Self-pay

## 2019-04-04 MED ORDER — LABETALOL HCL 300 MG PO TABS: 600.0000 mg | ORAL_TABLET | Freq: Three times a day (TID) | ORAL | 1 refills | Status: DC

## 2019-04-04 MED ORDER — TRAMADOL HCL 50 MG PO TABS: 50.0000 mg | ORAL_TABLET | Freq: Four times a day (QID) | ORAL | 0 refills | Status: DC | PRN

## 2019-04-04 NOTE — Plan of Care (Signed)
Discussed discharge medications, signs and symptoms to report, wound care instructions, follow up appointment, preeclampsia information w/ patient and significant other. Discussed Baby Blues. No questions. IV removed.

## 2019-04-04 NOTE — Lactation Note (Signed)
This note was copied from a baby's chart. Lactation Consultation Note  Patient Name: Kristie Crawford Date: 04/04/2019   Lactation visit attempted at [place] 1000; Mom appeared to be sleeping both times.   Matthias Hughs [facility] 04/04/2019, 10:10 AM

## 2019-04-04 NOTE — Lactation Note (Signed)
This note was copied from a baby's chart. Lactation Consultation Note  Patient Name: Kristie Crawford Date: 04/04/2019     [facility] attempted visit at 2015.  Mom was not in NICU.  [place] asked RN to call lactation when mom returns, if mom would like for lactation to visit her.     Maternal Data    Feeding Feeding Type: Donor Breast Milk  LATCH Score                   Interventions    Lactation Tools Discussed/Used     Consult Status      Ferne Coe [facility] 04/04/2019, 8:31 PM

## 2019-04-04 NOTE — Discharge Summary (Signed)
Obstetric Discharge Summary Reason for Admission: induction of labor and HELLP Prenatal Procedures: Preeclampsia and ultrasound Intrapartum Procedures: cesarean: low cervical, transverse Postpartum Procedures: none Complications-Operative and Postpartum: none Hemoglobin  Date Value Ref Range Status  04/03/2019 9.0 (L) 12.0 - 15.0 g/dL Final   HCT  Date Value Ref Range Status  04/03/2019 27.9 (L) 36.0 - 46.0 % Final    Physical Exam:  General: alert and cooperative Lochia: appropriate Uterine Fundus: firm Incision: healing well, no significant drainage DVT Evaluation: No evidence of DVT seen on physical exam.  Discharge Diagnoses: Preelampsia and preterm IOL  Discharge Information: Date: 04/04/2019 Activity: pelvic rest Diet: routine Medications: PNV, Colace and tramadol and labetolol 600 tid Condition: stable Instructions: refer to practice specific booklet Discharge to: home Follow-up Information    Olga Millers, MD Follow up in 1 week(s).   Specialty: Obstetrics and Gynecology Why: BP check Contact information: [place] [id number] [phone]           Newborn Data: Live born female  Birth Weight: 4 lb 13.6 oz (2200 g) APGAR: 6, 7  Newborn Delivery   Birth date/time: 03/31/2019 03:33:00 Delivery type: C-Section, Low Transverse Trial of labor: Yes C-section categorization: Primary     Baby will remain in NICU.  Allyn Kenner 04/04/2019, 11:54 AM

## 2019-04-05 ENCOUNTER — Ambulatory Visit: Payer: Self-pay

## 2019-04-05 NOTE — Lactation Note (Signed)
This note was copied from a baby's chart. Lactation Consultation Note  Patient Name: Kristie Crawford [id number] Date: 04/05/2019 Reason for consult: Follow-up assessment;Late-preterm 34-36.6wks;NICU baby;1st time breastfeeding;Primapara;Infant < 6lbs  NICU RN requested LC to speak with mother regarding her personal pump.  She is missing a pump part and had to order a new piece.  Mother may be interested in renting a [place].  Spoke with parents regarding [facility] loaner pump.  Explained policy regarding rental.  Father stated that he believes his mother has picked up the pump part today.  However, if they do not receive the pump part today I suggested when parents visit tomorrow they obtain a loaner pump.  Mother needs to continue pumping with the DEBP to help obtain and maintain a full milk supply for baby.  Parents sound interested in doing this and will contact lactation tomorrow if they do not receive the pump part.  RN updated.   Maternal Data    Feeding Feeding Type: Donor Breast Milk  LATCH Score                   Interventions    Lactation Tools Discussed/Used     Consult Status Consult Status: Complete Date: 04/05/19 Follow-up type: Call as needed    Lanice Schwab Lashunda Greis 04/05/2019, 4:51 PM

## 2019-04-06 ENCOUNTER — Ambulatory Visit: Payer: Self-pay

## 2019-04-06 NOTE — Lactation Note (Signed)
This note was copied from a baby's chart. Lactation Consultation Note  Patient Name: Kristie Crawford [id number] Date: 04/06/2019 Reason for consult: Follow-up assessment;Late-preterm 34-36.6wks;1st time breastfeeding;Primapara;Infant < 6lbs;Other (Comment)(days PP - visit requested by the mother - per NICU RN)  Randel Books is 33 days old.  Per mom milk did not come in until 2 days after she went home.  Per mom hasn't pumped since she left the hospital.  Has a Spectra at home and the part broke , she ordered, and its still not working.  Per mom the most  she has pumped off is 30 ml total and milk did come in  And felt really full after sleeping.  LC reviewed supply and demand. Mentioned to mom since she is 6 days out she has catching up to do and it is very important work on the consistent pumping to enhance the let down.  Sore nipple and engorgement prevention and tx reviewed.  LC recommended since mom is behind with her milk supply to make her  Last pumping of the day a power pumping 20 mins ,10 mins off over 60 mins or 10 min on 10 mins off over 60 mins, sleep 4-5 hours and 1st pumping in the morning a power pump. During the day and evening pump every 2-3 hours.  LC stressed the importance.  [place]  loaner DEBP paperwork completed and $30.oo received.  LC reviewed the settings on the DEBP and offered to check mom pumping and mom declined and planned on pumping when she got home.  LC reminded mom when she is visiting baby in NICU to plan on pumping in front of the baby for more baby thoughts and enhance let down.  Mom aware when she is in NICU - she can have the RN call [place].    Maternal Data    Feeding Feeding Type: Donor Breast Milk  LATCH Score                   Interventions Interventions: Breast feeding basics reviewed  Lactation Tools Discussed/Used Tools: Pump(LC reviewed the set up of the DEBP and settings) Breast pump type: Double-Electric Breast Pump WIC Program: Yes(LC -  [facility] sent a WIC referral for a DEBP - today mom obtained a DEBP WIC loaner from [facility]) Pump Review: Milk Storage;Setup, frequency, and cleaning(LC reviewed - see LC note) Initiated by:: MAI reviewed Date initiated:: 04/06/19   Consult Status Consult Status: PRN Follow-up type: In-patient(baby in NICU 305)    Myer Haff 04/06/2019, 4:33 PM

## 2019-05-07 ENCOUNTER — Encounter (HOSPITAL_COMMUNITY): Payer: Self-pay

## 2019-05-07 ENCOUNTER — Inpatient Hospital Stay (HOSPITAL_COMMUNITY)
Admission: AD | Admit: 2019-05-07 | Discharge: 2019-05-07 | Disposition: A | Discharge status: Home / Self Care | Payer: Medicaid Other | Source: Ambulatory Visit | Attending: Obstetrics & Gynecology | Admitting: Obstetrics & Gynecology

## 2019-05-07 ENCOUNTER — Other Ambulatory Visit: Payer: Self-pay

## 2019-05-07 DIAGNOSIS — O9089 Other complications of the puerperium, not elsewhere classified: Secondary | ICD-10-CM | POA: Diagnosis not present

## 2019-05-07 DIAGNOSIS — N858 Other specified noninflammatory disorders of uterus: Secondary | ICD-10-CM

## 2019-05-07 DIAGNOSIS — Z888 Allergy status to other drugs, medicaments and biological substances status: Secondary | ICD-10-CM | POA: Insufficient documentation

## 2019-05-07 DIAGNOSIS — Z881 Allergy status to other antibiotic agents status: Secondary | ICD-10-CM | POA: Diagnosis not present

## 2019-05-07 DIAGNOSIS — Z809 Family history of malignant neoplasm, unspecified: Secondary | ICD-10-CM | POA: Insufficient documentation

## 2019-05-07 DIAGNOSIS — R300 Dysuria: Secondary | ICD-10-CM | POA: Diagnosis not present

## 2019-05-07 DIAGNOSIS — Z833 Family history of diabetes mellitus: Secondary | ICD-10-CM | POA: Diagnosis not present

## 2019-05-07 DIAGNOSIS — O9989 Other specified diseases and conditions complicating pregnancy, childbirth and the puerperium: Secondary | ICD-10-CM | POA: Diagnosis not present

## 2019-05-07 HISTORY — DX: Gestational (pregnancy-induced) hypertension without significant proteinuria, unspecified trimester: O13.9

## 2019-05-07 LAB — URINALYSIS, ROUTINE W REFLEX MICROSCOPIC
Bilirubin Urine: NEGATIVE
Glucose, UA: NEGATIVE mg/dL
Ketones, ur: NEGATIVE mg/dL
Nitrite: NEGATIVE
Protein, ur: 30 mg/dL — AB
Specific Gravity, Urine: 1.024 (ref 1.005–1.030)
pH: 5 (ref 5.0–8.0)

## 2019-05-07 LAB — URINE CULTURE: Culture: NO GROWTH

## 2019-05-07 MED ORDER — CEPHALEXIN 500 MG PO CAPS: 500.0000 mg | ORAL_CAPSULE | Freq: Four times a day (QID) | ORAL | 2 refills | Status: DC

## 2019-05-07 NOTE — MAU Note (Signed)
Treated for uti one week ago but is not getting better. Had c/s 8/17 due to HELLP. Tonight passed few "clumps of flesh from my vagina". Temp was 100.1 before starting treatment for uti but lower since then. Some jabbing pain around c/s scar. No pain currently

## 2019-05-07 NOTE — MAU Provider Note (Signed)
Chief Complaint:  No chief complaint on file.   First Provider Initiated Contact with Patient 05/07/19 0250      HPI: Kristie Crawford is a 18 y.o. G1P0101 who is 5 weeks postpartum who presents to maternity admissions reporting dysuria that has persisted despite being treated with Macrobid for UTI.  Also had passed some clumps of tissue from vagina.  Brought one to show. Had a fever last week before UTI treatment but no further fevers since. She reports vaginal itching/burning, h/a, dizziness, n/v, or fever/chills.    RN Note: Treated for uti one week ago but is not getting better. Had c/s 8/17 due to HELLP. Tonight passed few "clumps of flesh from my vagina". Temp was 100.1 before starting treatment for uti but lower since then. Some jabbing pain around c/s scar. No pain currently  Past Medical History: Past Medical History:  Diagnosis Date  . Ovarian cyst   . Pregnancy induced hypertension   . Recurrent tonsillitis 09/2017   current antibiotic, will finish 09/25/2017    Past obstetric history: OB History  Gravida Para Term Preterm AB Living  1 1   1   1   SAB TAB Ectopic Multiple Live Births        0 1    # Outcome Date GA Lbr Len/2nd Weight Sex Delivery Anes PTL Lv  1 Preterm 03/31/19 [redacted]w[redacted]d  2200 g M CS-LTranv Spinal  LIV    Past Surgical History: Past Surgical History:  Procedure Laterality Date  . CESAREAN SECTION N/A 03/31/2019   Procedure: CESAREAN SECTION;  Surgeon: 04/02/2019, MD;  Location: [facility];  Service: Obstetrics;  Laterality: N/A;  . TONSILLECTOMY N/A 10/01/2017   Procedure: TONSILLECTOMY;  Surgeon: 10/03/2017, MD;  Location: [facility];  Service: ENT;  Laterality: N/A;  . WISDOM TOOTH EXTRACTION  10/2014    Family History: Family History  Problem Relation Age of Onset  . Cancer Mother   . Cancer Maternal Aunt   . Cancer Maternal Uncle   . Cancer Maternal Grandmother   . Stroke Maternal Grandmother   . Diabetes Maternal  Grandmother     Social History: Social History   Tobacco Use  . Smoking status: Never Smoker  . Smokeless tobacco: Never Used  Substance Use Topics  . Alcohol use: No  . Drug use: No    Allergies:  Allergies  Allergen Reactions  . Dexamethasone Swelling  . Ciprofloxacin Rash    Meds:  Medications Prior to Admission  Medication Sig Dispense Refill Last Dose  . labetalol (NORMODYNE) 300 MG tablet Take 2 tablets (600 mg total) by mouth 3 (three) times daily. 90 tablet 1 05/06/2019 at 0900  . Prenatal Vit-Fe Fumarate-FA (PRENATAL MULTIVITAMIN) TABS tablet Take 1 tablet by mouth daily at 12 noon.   05/06/2019 at 1200  . traMADol (ULTRAM) 50 MG tablet Take 1 tablet (50 mg total) by mouth every 6 (six) hours as needed for moderate pain. 20 tablet 0     I have reviewed patient's Past Medical Hx, Surgical Hx, Family Hx, Social Hx, medications and allergies.  ROS:  Review of Systems  Constitutional: Negative for chills and fever.  Respiratory: Negative for shortness of breath.   Gastrointestinal: Negative for abdominal pain, constipation, diarrhea and nausea.  Genitourinary: Positive for dysuria. Negative for difficulty urinating, flank pain and vaginal bleeding.  Musculoskeletal: Negative for back pain.   Other systems negative    Physical Exam   Patient Vitals for the past  24 hrs:  BP Temp Pulse Resp Height Weight  05/07/19 0248 126/72 - 70 - - -  05/07/19 0220 127/74 - 78 - - -  05/07/19 0218 - 98.1 F (36.7 C) - 18 5\' 2"  (1.575 m) 72.1 kg   Constitutional: Well-developed, well-nourished female in no acute distress.  Cardiovascular: normal rate and rhythm Respiratory: normal effort, no distress.  GI: Abd soft, non-tender.  Nondistended.  No rebound, No guarding.  MS: Extremities nontender, no edema, normal ROM Neurologic: Alert and oriented x 4.   Grossly nonfocal. GU: Neg CVAT bilaterally Skin:  Warm and Dry Psych:  Affect appropriate.  PELVIC EXAM: Cervix pink,  visually closed, without lesion, scant tan mucous discharge, vaginal walls and external genitalia normal    Tissue brought by pt looks like decidual cast.  No tissue seen in vagina Bimanual exam: Cervix firm, anterior, neg CMT, uterus nontender, nonenlarged, adnexa without tenderness, enlargement, or mass  Exam is limited by body habitus.    Labs: Results for orders placed or performed during the hospital encounter of 05/07/19 (from the past 24 hour(s))  Urinalysis, Routine w reflex microscopic     Status: Abnormal   Collection Time: 05/07/19  2:30 AM  Result Value Ref Range   Color, Urine YELLOW YELLOW   APPearance CLOUDY (A) CLEAR   Specific Gravity, Urine 1.024 1.005 - 1.030   pH 5.0 5.0 - 8.0   Glucose, UA NEGATIVE NEGATIVE mg/dL   Hgb urine dipstick MODERATE (A) NEGATIVE   Bilirubin Urine NEGATIVE NEGATIVE   Ketones, ur NEGATIVE NEGATIVE mg/dL   Protein, ur 30 (A) NEGATIVE mg/dL   Nitrite NEGATIVE NEGATIVE   Leukocytes,Ua MODERATE (A) NEGATIVE   RBC / HPF 0-5 0 - 5 RBC/hpf   WBC, UA 21-50 0 - 5 WBC/hpf   Bacteria, UA RARE (A) NONE SEEN   Squamous Epithelial / LPF 11-20 0 - 5   Mucus PRESENT     --/--/B POS, B POS Performed at [facility], [address] [place], [place] [address]  [phone])  Imaging:  No results found.  MAU Course/MDM: I have ordered labs as follows:  UA, urine culture Imaging ordered: none Results reviewed. Urine has blood and leukocytes with rare bacteria on micro. Sent to culture   Treatments in MAU included none.   Pt stable at time of discharge.  Assessment: S/P decidual cast passed Dysuria Probable resistant Urinary tract infection  Plan: Discharge home Recommend urine to culture Rx sent for Keflex for probable persistent UTI   Encouraged to return here or to other Urgent Care/ED if she develops worsening of symptoms, increase in pain, fever, or other concerning symptoms.   Hansel Feinstein CNM, MSN Certified  Nurse-Midwife 05/07/2019 2:51 AM

## 2019-05-07 NOTE — Discharge Instructions (Signed)

## 2019-06-06 IMAGING — US ULTRASOUND ABDOMEN LIMITED
1 series · 14 of 25 positions shown · non-contrast
Comparison: None.

CLINICAL DATA: Abdominal pain. Nausea, vomiting, diarrhea. Pregnant
patient at 14 weeks gestation.

EXAM:
ULTRASOUND ABDOMEN LIMITED RIGHT UPPER QUADRANT

[Series 1: ultrasound abdomen limited · 14 of 63 slices shown]
[im 1/63]
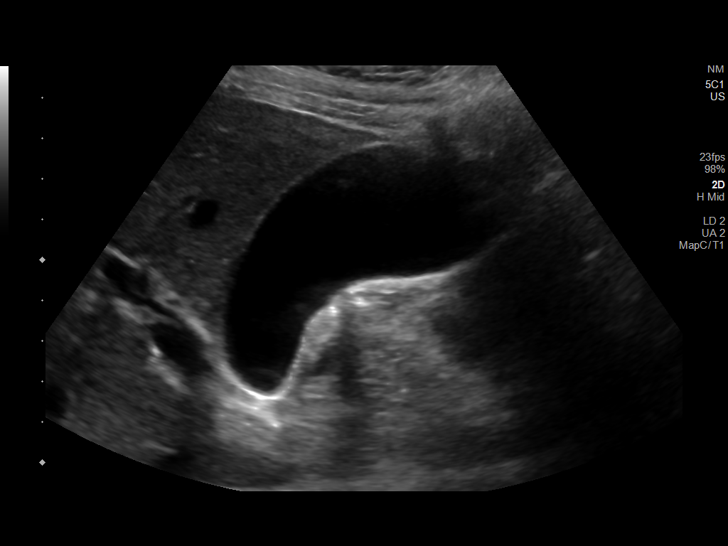
[im 6/63]
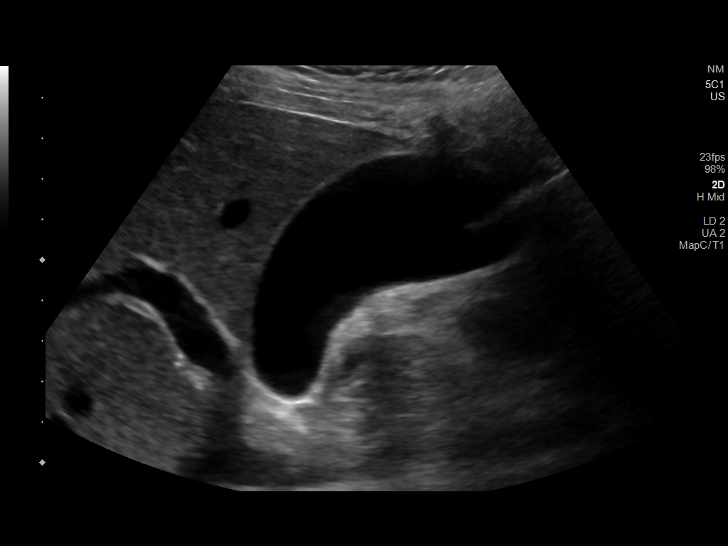
[im 11/63]
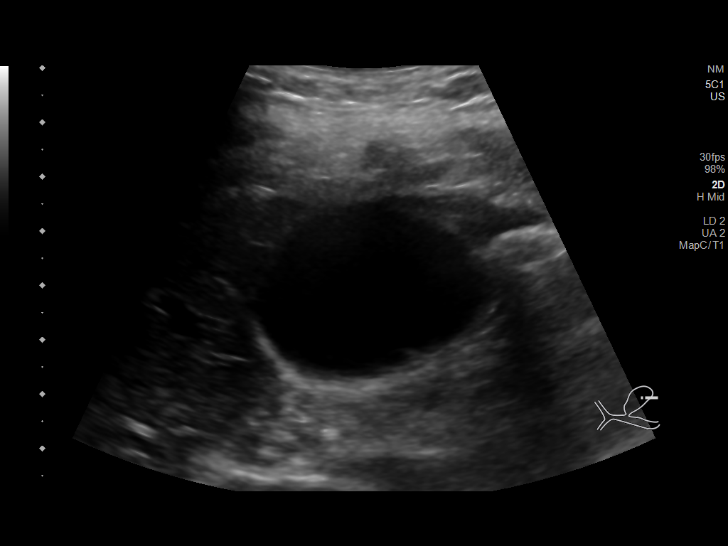
[im 16/63]
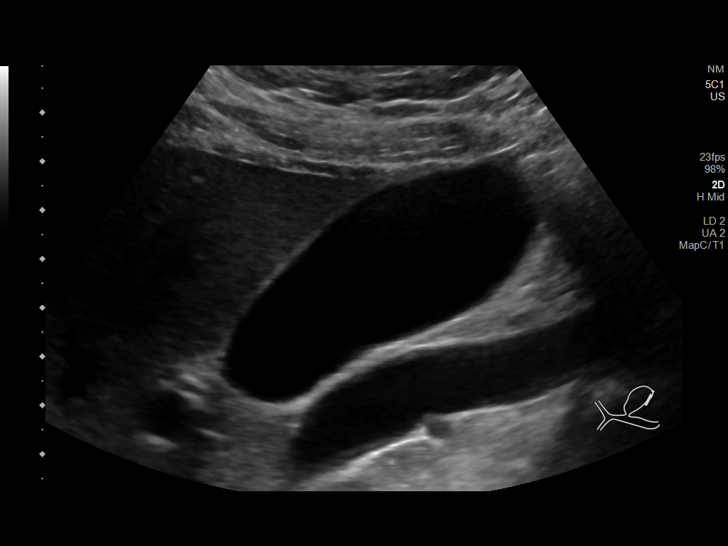
[im 21/63]
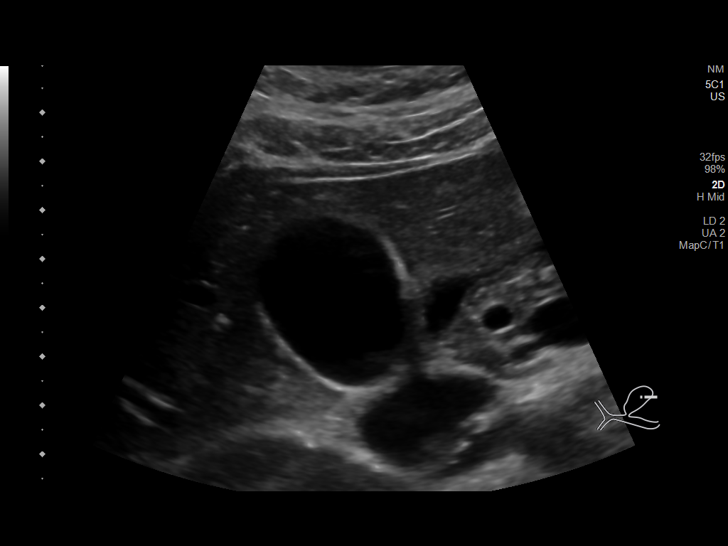
[im 24/63]
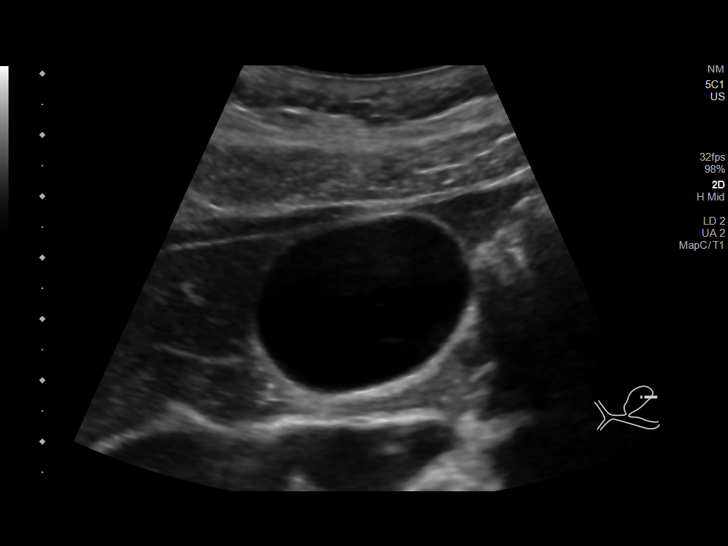
[im 29/63]
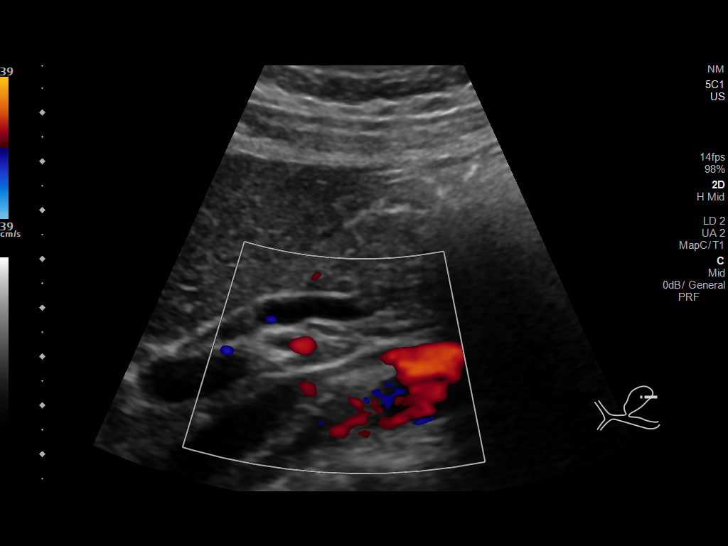
[im 34/63]
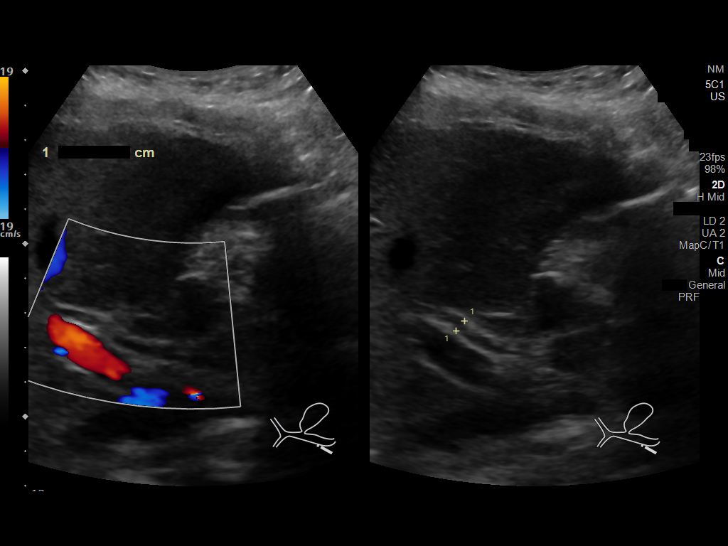
[im 39/63]
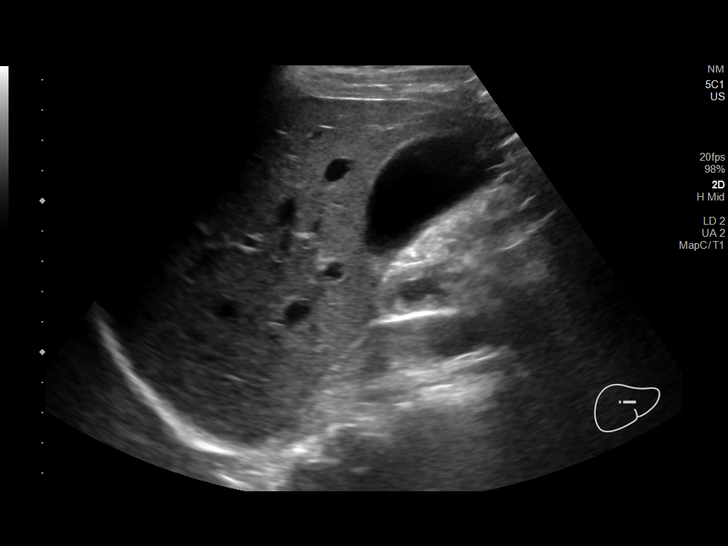
[im 42/63]
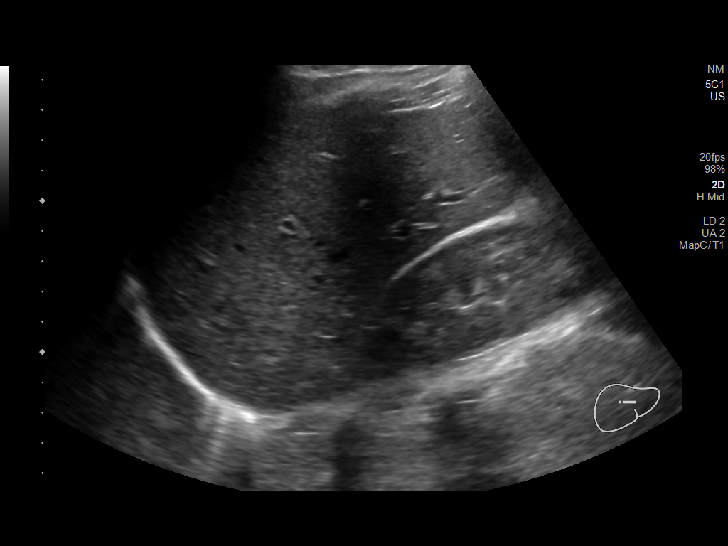
[im 47/63]
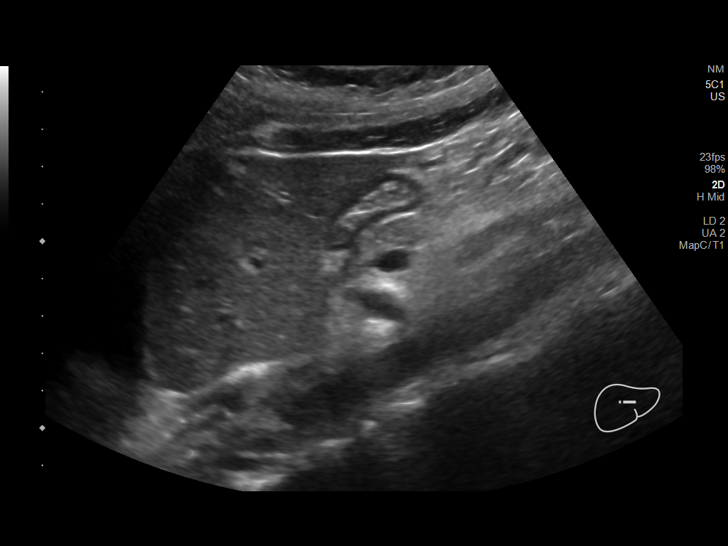
[im 52/63]
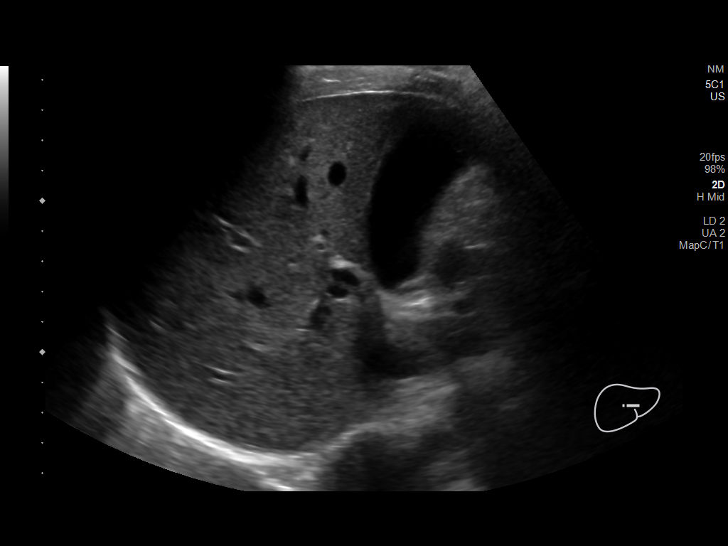
[im 57/63]
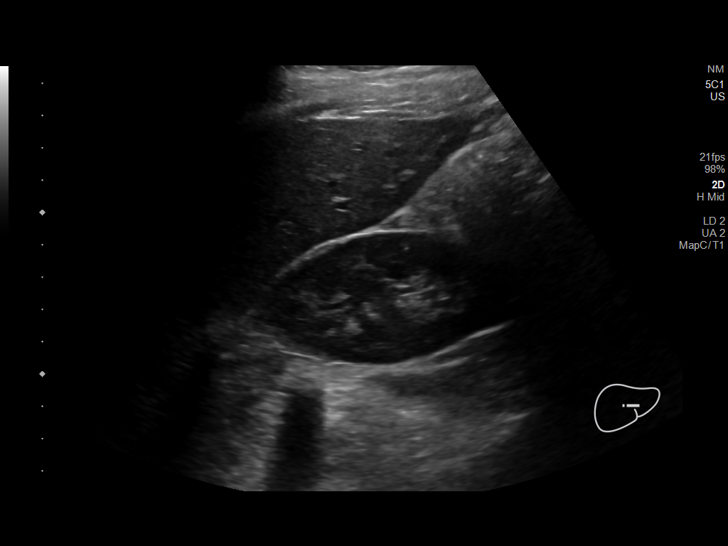
[im 63/63]
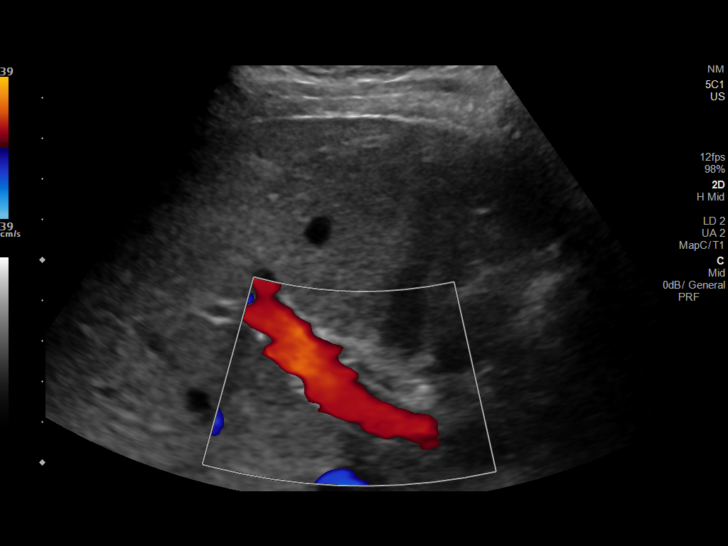

[14 of 25 positions shown; findings below may reference images not displayed]

FINDINGS: Gallbladder:

Distended. No gallstones or wall thickening visualized. No
pericholecystic fluid. No sonographic Murphy sign noted by
sonographer.

Common bile duct:

Diameter: 4-5 mm, normal.

Liver:

No focal lesion identified. Within normal limits in parenchymal
echogenicity. Portal vein is patent on color Doppler imaging with
normal direction of blood flow towards the liver.
IMPRESSION: Unremarkable right upper quadrant ultrasound.  No gallstones.

## 2019-09-24 ENCOUNTER — Emergency Department (HOSPITAL_COMMUNITY): Payer: Medicaid Other

## 2019-09-24 ENCOUNTER — Encounter (HOSPITAL_COMMUNITY): Payer: Self-pay | Admitting: Radiology

## 2019-09-24 ENCOUNTER — Emergency Department (HOSPITAL_COMMUNITY)
Admission: EM | Admit: 2019-09-24 | Discharge: 2019-09-24 | Disposition: A | Discharge status: Home / Self Care | Payer: Medicaid Other | Attending: Emergency Medicine | Admitting: Emergency Medicine

## 2019-09-24 ENCOUNTER — Other Ambulatory Visit: Payer: Self-pay

## 2019-09-24 DIAGNOSIS — R569 Unspecified convulsions: Secondary | ICD-10-CM | POA: Diagnosis not present

## 2019-09-24 DIAGNOSIS — Z79899 Other long term (current) drug therapy: Secondary | ICD-10-CM | POA: Insufficient documentation

## 2019-09-24 DIAGNOSIS — R519 Headache, unspecified: Secondary | ICD-10-CM | POA: Diagnosis not present

## 2019-09-24 DIAGNOSIS — R2990 Unspecified symptoms and signs involving the nervous system: Secondary | ICD-10-CM | POA: Diagnosis not present

## 2019-09-24 LAB — URINALYSIS, ROUTINE W REFLEX MICROSCOPIC
Bilirubin Urine: NEGATIVE
Glucose, UA: NEGATIVE mg/dL
Hgb urine dipstick: NEGATIVE
Ketones, ur: NEGATIVE mg/dL
Leukocytes,Ua: NEGATIVE
Nitrite: NEGATIVE
Protein, ur: NEGATIVE mg/dL
Specific Gravity, Urine: 1.025 (ref 1.005–1.030)
pH: 6 (ref 5.0–8.0)

## 2019-09-24 LAB — BASIC METABOLIC PANEL
Anion gap: 10 (ref 5–15)
BUN: 14 mg/dL (ref 6–20)
CO2: 23 mmol/L (ref 22–32)
Calcium: 9.1 mg/dL (ref 8.9–10.3)
Chloride: 107 mmol/L (ref 98–111)
Creatinine, Ser: 0.67 mg/dL (ref 0.44–1.00)
GFR calc Af Amer: >60 mL/min (ref >60)
GFR calc non Af Amer: >60 mL/min (ref >60)
Glucose, Bld: 88 mg/dL (ref 70–99)
Potassium: 3.8 mmol/L (ref 3.5–5.1)
Sodium: 140 mmol/L (ref 135–145)

## 2019-09-24 LAB — I-STAT BETA HCG BLOOD, ED (MC, WL, AP ONLY): I-stat hCG, quantitative: <5 m[IU]/mL (ref <5)

## 2019-09-24 LAB — RAPID URINE DRUG SCREEN, HOSP PERFORMED
Amphetamines: NOT DETECTED
Barbiturates: NOT DETECTED
Benzodiazepines: NOT DETECTED
Cocaine: NOT DETECTED
Opiates: NOT DETECTED
Tetrahydrocannabinol: NOT DETECTED

## 2019-09-24 LAB — CBC WITH DIFFERENTIAL/PLATELET
Abs Immature Granulocytes: 0.03 10*3/uL (ref 0.00–0.07)
Basophils Absolute: 0 10*3/uL (ref 0.0–0.1)
Basophils Relative: 1 %
Eosinophils Absolute: 0.1 10*3/uL (ref 0.0–0.5)
Eosinophils Relative: 2 %
HCT: 35.4 % — ABNORMAL LOW (ref 36.0–46.0)
Hemoglobin: 11.3 g/dL — ABNORMAL LOW (ref 12.0–15.0)
Immature Granulocytes: 0 %
Lymphocytes Relative: 33 %
Lymphs Abs: 2.3 10*3/uL (ref 0.7–4.0)
MCH: 25.7 pg — ABNORMAL LOW (ref 26.0–34.0)
MCHC: 31.9 g/dL (ref 30.0–36.0)
MCV: 80.5 fL (ref 80.0–100.0)
Monocytes Absolute: 0.5 10*3/uL (ref 0.1–1.0)
Monocytes Relative: 8 %
Neutro Abs: 4.1 10*3/uL (ref 1.7–7.7)
Neutrophils Relative %: 56 %
Platelets: 276 10*3/uL (ref 150–400)
RBC: 4.4 MIL/uL (ref 3.87–5.11)
RDW: 13.7 % (ref 11.5–15.5)
WBC: 7.1 10*3/uL (ref 4.0–10.5)
nRBC: 0 % (ref 0.0–0.2)

## 2019-09-24 MED ORDER — GADOBUTROL 1 MMOL/ML IV SOLN: 7.5000 mL | Freq: Once | INTRAVENOUS | Status: DC | PRN

## 2019-09-24 MED ORDER — IOHEXOL 300 MG/ML  SOLN
75.0000 mL | Freq: Once | INTRAMUSCULAR | Status: AC | PRN
  Administered 2019-09-24: 22:00:00 75 mL via INTRAVENOUS

## 2019-09-24 MED ORDER — GADOBUTROL 1 MMOL/ML IV SOLN
7.5000 mL | Freq: Once | INTRAVENOUS | Status: AC | PRN
  Administered 2019-09-24: 7.5 mL via INTRAVENOUS

## 2019-09-24 NOTE — Plan of Care (Signed)
MRI brain without acute process. MRV brain with concern/question of a filling defect in one of the venous sinuses-inconclusive--recommended follow-up with a CTV head which was performed and negative for dural venous sinus thrombosis. No further neurological recommendations from a neurological standpoint.  Outpatient follow-up and recommendations as advised by Dr. Amada Jupiter in the initial consult. Plan discussed with the ED provider. -- Milon Dikes, MD [facility] Pager: [phone] If 7pm to 7am, please call on call as listed on AMION.

## 2019-09-24 NOTE — ED Triage Notes (Signed)
Pt here from home with c/o possible seizure after starting a new medication from her psychiatrist , pt awake this morning with head and neck pain and incontinence of urine

## 2019-09-24 NOTE — ED Notes (Signed)
Pt transported to MRI 

## 2019-09-24 NOTE — ED Notes (Signed)
Pt verbalized understanding of discharge instructions. Follow up care reviewed, pt had no further questions at this time. 

## 2019-09-24 NOTE — ED Provider Notes (Signed)
[facility] Provider Note   CSN: [id number] Arrival date & time: 09/24/19  1440     History No chief complaint on file.   Kristie [facility] is a 19 y.o. female.  HPI     19 y/o female with a h/o ovarian cyst, HELLP syndrome, who presents to the ED today for eval of possible seizure. States that last night she was at work and she became very hot and felt lightheaded. She splashed some water on her face and states she started seeing spots.    States she felt her muscles tighten and she jerked her head backwards hitting a metal pole. She then walked outside. She then states she cannot really recall what happened after that. States she was told that she was having trouble speaking during the episide. Then next thing she knew she woke up and was sitting at a desk. She states that she was told she had a seizure and was sent home from work.  Pt states that she woke up this morning and she felt like her facial muscles and the right side of her neck was tight. She also had urinary incontinence when she woke up which is not normal for her. Denies any tongue bruising. Denies fevers. She has never had a seizure before.   She is c/o right sided headaches for the last week. She has a h/o HA's but they are usually frontal in nature. Headache is constant. She rates it 9/10. She states she has been intermittently seeing spots to both eyes for the last week. Denies unilateral numbness/weakness.   She started Vraylar and Prazosin 1 month ago. She has had some lightheadedness for the last month since starting it, but is has improved some. She also had a vasovagal episode when first starting these new meds. Denies cough, congestion, abd pain, nausea, vomiting. She had diarrhea 1 week ago and this has since resolved.  5:03 PM Pt called her coworker Kristie Crawford, and had her on speaking phone. She states that she did not witness any episodes of shaking or loss of consciousness.     5:11 PM Attempted to contact the patient's supervisor, Kristie Crawford who was present during the episode but reached voicemail.   Past Medical History:  Diagnosis Date  . Ovarian cyst   . Pregnancy induced hypertension   . Recurrent tonsillitis 09/2017   current antibiotic, will finish 09/25/2017    Patient Active Problem List   Diagnosis Date Noted  . HELLP (hemolytic anemia/elev liver enzymes/low platelets in pregnancy) 03/31/2019  . Gestational thrombocytopenia ([place]) 03/29/2019  . Labor and delivery, indication for care 03/29/2019  . History of dental surgery-wisdom teeth extraction 01/03/2015  . Facial abscess 01/02/2015  . Trismus   . Allergic rhinitis, seasonal 07/28/2014    Past Surgical History:  Procedure Laterality Date  . CESAREAN SECTION N/A 03/31/2019   Procedure: CESAREAN SECTION;  Surgeon: Olga Millers, MD;  Location: [facility];  Service: Obstetrics;  Laterality: N/A;  . TONSILLECTOMY N/A 10/01/2017   Procedure: TONSILLECTOMY;  Surgeon: Izora Gala, MD;  Location: [facility];  Service: ENT;  Laterality: N/A;  . WISDOM TOOTH EXTRACTION  10/2014     OB History    Gravida  1   Para  1   Term      Preterm  1   AB      Living  1     SAB      TAB  Ectopic      Multiple  0   Live Births  1           Family History  Problem Relation Age of Onset  . Cancer Mother   . Cancer Maternal Aunt   . Cancer Maternal Uncle   . Cancer Maternal Grandmother   . Stroke Maternal Grandmother   . Diabetes Maternal Grandmother     Social History   Tobacco Use  . Smoking status: Never Smoker  . Smokeless tobacco: Never Used  Substance Use Topics  . Alcohol use: No  . Drug use: No    Home Medications Prior to Admission medications   Medication Sig Start Date End Date Taking? Authorizing Provider  prazosin (MINIPRESS) 1 MG capsule Take 1 mg by mouth at bedtime. 08/23/19  Yes [provider]  VRAYLAR capsule Take 1.5 mg by  mouth daily. 09/18/19  Yes [provider]  labetalol (NORMODYNE) 300 MG tablet Take 2 tablets (600 mg total) by mouth 3 (three) times daily. Patient not taking: Reported on 09/24/2019 04/04/19   Philip Aspen, DO  traMADol (ULTRAM) 50 MG tablet Take 1 tablet (50 mg total) by mouth every 6 (six) hours as needed for moderate pain. Patient not taking: Reported on 09/24/2019 04/04/19   Philip Aspen, DO    Allergies    Dexamethasone and Ciprofloxacin  Review of Systems   Review of Systems  Constitutional: Negative for fever.  HENT: Negative for ear pain and sore throat.   Eyes: Positive for visual disturbance.  Respiratory: Negative for cough and shortness of breath.   Cardiovascular: Negative for chest pain.  Gastrointestinal: Negative for abdominal pain, constipation, diarrhea, nausea and vomiting.  Genitourinary: Negative for dysuria and hematuria.  Musculoskeletal: Positive for neck pain. Negative for back pain.  Skin: Negative for rash.  Neurological: Positive for light-headedness and headaches.       Possible seizure  All other systems reviewed and are negative.   Physical Exam Updated Vital Signs BP 126/88   Pulse 80   Temp 98.1 F (36.7 C) (Oral)   Resp 16   SpO2 100%   Physical Exam Vitals and nursing note reviewed.  Constitutional:      General: She is not in acute distress.    Appearance: She is well-developed.  HENT:     Head: Normocephalic and atraumatic.  Eyes:     Conjunctiva/sclera: Conjunctivae normal.  Cardiovascular:     Rate and Rhythm: Normal rate and regular rhythm.     Pulses: Normal pulses.     Heart sounds: Normal heart sounds. No murmur.  Pulmonary:     Effort: Pulmonary effort is normal. No respiratory distress.     Breath sounds: Normal breath sounds. No wheezing, rhonchi or rales.  Abdominal:     General: Bowel sounds are normal.     Palpations: Abdomen is soft.     Tenderness: There is no abdominal tenderness. There is no  guarding or rebound.  Musculoskeletal:     Cervical back: Neck supple.     Comments: No midline cervical TTP. Right cervical paraspinous muscle TTP and TTP to the occiput.  Skin:    General: Skin is warm and dry.  Neurological:     Mental Status: She is alert.     Comments: Mental Status:  Alert, thought content appropriate, able to give a coherent history. Speech fluent without evidence of aphasia. Able to follow 2 step commands without difficulty.  Cranial Nerves:  II: pupils equal, round,  reactive to light III,IV, VI: ptosis not present, extra-ocular motions intact bilaterally  V,VII: smile symmetric, facial light touch sensation equal VIII: hearing grossly normal to voice  X: uvula elevates symmetrically  XI: bilateral shoulder shrug symmetric and strong XII: midline tongue extension without fassiculations Motor:  Normal tone. 5/5 strength of BUE and BLE major muscle groups including strong and equal grip strength and dorsiflexion/plantar flexion Sensory: light touch normal in all extremities.     ED Results / Procedures / Treatments   Labs (all labs ordered are listed, but only abnormal results are displayed) Labs Reviewed  CBC WITH DIFFERENTIAL/PLATELET - Abnormal; Notable for the following components:      Result Value   Hemoglobin 11.3 (*)    HCT 35.4 (*)    MCH 25.7 (*)    All other components within normal limits  BASIC METABOLIC PANEL  RAPID URINE DRUG SCREEN, HOSP PERFORMED  URINALYSIS, ROUTINE W REFLEX MICROSCOPIC  CBG MONITORING, ED  I-STAT BETA HCG BLOOD, ED ([facility], [facility], [facility] ONLY)    EKG None  Radiology CT HEAD WO CONTRAST  Result Date: 09/24/2019 CLINICAL DATA:  Seizure, head and neck pain, incontinence EXAM: CT HEAD WITHOUT CONTRAST TECHNIQUE: Contiguous axial images were obtained from the base of the skull through the vertex without intravenous contrast. COMPARISON:  01/01/2015 FINDINGS: Brain: No acute infarct or hemorrhage. Cavum septum pellucidum is  incidentally noted, a frequent anatomic variant. The lateral ventricles and remaining midline structures are unremarkable. No acute extra-axial fluid collections. No mass effect. Vascular: No hyperdense vessel or unexpected calcification. Skull: Normal. Negative for fracture or focal lesion. Sinuses/Orbits: No acute finding. Other: None IMPRESSION: 1. No acute intracranial process. Electronically Signed   By: Sharlet Salina M.D.   On: 09/24/2019 15:35   CT CERVICAL SPINE WO CONTRAST  Result Date: 09/24/2019 CLINICAL DATA:  Possible seizure, neck pain, incontinence EXAM: CT CERVICAL SPINE WITHOUT CONTRAST TECHNIQUE: Multidetector CT imaging of the cervical spine was performed without intravenous contrast. Multiplanar CT image reconstructions were also generated. COMPARISON:  None. FINDINGS: Alignment: Slight loss of cervical lordosis is likely positional. Otherwise alignment is anatomic. Skull base and vertebrae: No acute displaced fractures. Soft tissues and spinal canal: No prevertebral fluid or swelling. No visible canal hematoma. Disc levels:  No significant spondylosis or facet hypertrophy. Upper chest: Airways patent. Visualized portions of the lung apices are clear. Other: Reconstructed images demonstrate no additional findings. IMPRESSION: 1. No acute cervical spine fracture. Electronically Signed   By: Sharlet Salina M.D.   On: 09/24/2019 15:37   MR BRAIN W WO CONTRAST  Result Date: 09/24/2019 CLINICAL DATA:  Headaches and lightheadedness. History of seizure. EXAM: MRI HEAD WITHOUT AND WITH CONTRAST TECHNIQUE: Multiplanar, multiecho pulse sequences of the brain and surrounding structures were obtained without and with intravenous contrast. CONTRAST:  7.30mL GADAVIST GADOBUTROL 1 MMOL/ML IV SOLN COMPARISON:  None. FINDINGS: Brain: No acute infarct, acute hemorrhage or extra-axial collection. Normal white matter signal. Normal volume of CSF spaces. No chronic microhemorrhage. Incidentally noted cavum  septum pellucidum et vergae. There is no abnormal contrast enhancement. Vascular: Normal flow voids. Skull and upper cervical spine: Normal marrow signal. Sinuses/Orbits: Negative. Other: None. IMPRESSION: No acute intracranial abnormality. Unremarkable MRI of the brain. Electronically Signed   By: Deatra Robinson M.D.   On: 09/24/2019 20:49   MR MRV HEAD W WO CONTRAST  Result Date: 09/24/2019 CLINICAL DATA:  Acute presentation with headaches and dizziness over the last week. History of seizures. EXAM: MR VENOGRAM HEAD  WITHOUT AND WITH CONTRAST TECHNIQUE: Angiographic images of the intracranial venous structures were obtained using MRV technique without and with intravenous contrast. COMPARISON:  Head CT same day. FINDINGS: Superior sagittal sinus is normal. Deep venous system is normal. Left transverse sinus, sigmoid sinus and jugular vein are normal. Non dominant right transverse sinus seems to show a linear nonocclusive filling defect towards the lateral edge which could represent a partial, nonocclusive thrombus. No parenchymal abnormality is seen. No evidence of brain or leptomeningeal enhancement. IMPRESSION: Linear filling defect in the right lateral transverse sinus and proximal sigmoid sinus suggesting partial nonocclusive thrombosis in that location. Images marked by arrows. The remainder of the venous system is normal. No parenchymal or leptomeningeal abnormality identified. I realize this is a significant treatment branch point. I discussed this case with Dr. Chase Picket. We both feel that the finding is suggestive but not conclusive. Therefore, consider CT venography for an additional evaluation data point. Electronically Signed   By: Paulina Fusi M.D.   On: 09/24/2019 20:57   CT VENOGRAM HEAD  Result Date: 09/24/2019 CLINICAL DATA:  Abnormal MRV. Possible right transverse sinus filling defect. EXAM: CT VENOGRAM HEAD TECHNIQUE: Vena graphic postcontrast images of the head were obtained. Multi planer  reformats and maximum intensity projections were also generated. CONTRAST:  19mL OMNIPAQUE IOHEXOL 300 MG/ML  SOLN COMPARISON:  MRV brain same day FINDINGS: Superior sagittal sinus: Normal. Straight sinus: Normal. Inferior sagittal sinus, vein of Galen and internal cerebral veins: Normal. Transverse sinuses: Normal. Sigmoid sinuses: Normal. Visualized jugular veins: Normal. IMPRESSION: Normal CT venogram. Electronically Signed   By: Deatra Robinson M.D.   On: 09/24/2019 22:42    Procedures Procedures (including critical care time)  Medications Ordered in ED Medications  gadobutrol (GADAVIST) 1 MMOL/ML injection 7.5 mL (has no administration in time range)  gadobutrol (GADAVIST) 1 MMOL/ML injection 7.5 mL (7.5 mLs Intravenous Contrast Given 09/24/19 2033)  iohexol (OMNIPAQUE) 300 MG/ML solution 75 mL (75 mLs Intravenous Contrast Given 09/24/19 2211)    ED Course  I have reviewed the triage vital signs and the nursing notes.  Pertinent labs & imaging results that were available during my care of the patient were reviewed by me and considered in my medical decision making (see chart for details).    MDM Rules/Calculators/A&P                      19 y/o F presenting for eval of possible seizure.  Reviewed labs ordered in triage CBC w/o leukocytosis, anemia present but improved from prior BMP w/o electrolyte derangement, normal blood glucose Beta hcg neg UA neg UDS neg  CT head/cervical spine neg for acute abnormality  6:22 PM CONSULT with Dr. Amada Jupiter who recommends MRI with and w/o contrast, MRV, EEG, and likely admit to hospitalist for observation. He will see the patient in the ED.   7:02 PM Dr. Amada Jupiter provided update after seeing the patient. He has lower suspicion for seizure, and does not feel pt needs EEG. He recommends MRI w/o contrast and MRV. If findings are wnl, then she can f/u with neuro as an outpatient. If abnormal, d/w neurology.  MRI wnl MRV V with linear  filling defect in the right lateral transverse sinus and proximal sigmoid sinus suggesting partial nonocclusive thrombosis in that location. Images marked by arrows. The remainder of the venous system is normal. No parenchymal or leptomeningeal abnormality identified. Rad recommended consideration of getting CT venogram to further eval.   9:35 PM CONSULT  with Dr. Laurence Slate who recommends obtaining at CT venogram of the head.   CT venogram is neg for acute abnormality  10:55 PM CONSULT  With Dr. Wilford Corner who states pt can f/u with neuro in 2-4 weeks.   Reassessed patient.  Discussed results of the work-up and plan for discharge with neurology follow-up as an outpatient.  Advised her to call her psychiatrist in regards to her new medications as she may need to change statin if she is continuing to have any adverse reaction to them.  Advised on specific return precautions.  She voices understanding of the plan and reasons to return.  All questions answered.  Patient stable for discharge.  Final Clinical Impression(s) / ED Diagnoses Final diagnoses:  Seizure-like activity (HCC)  Acute nonintractable headache, unspecified headache type    Rx / DC Orders ED Discharge Orders         Ordered    Ambulatory referral to Neurology    Comments: An appointment is requested in approximately: 2 weeks   09/24/19 2301           Rayne Du 09/24/19 2303    Arby Barrette, MD 09/26/19 1539

## 2019-09-24 NOTE — Consult Note (Signed)
Neurology Consultation Reason for Consult: Concern for seizure Referring Physician: Alwyn Ren  CC: "Episode"  History is obtained from: Patient, coworker  HPI: [facility] is a 19 y.o. female with no history of seizures who presents with headaches and lightheadedness for the past week as well as an episode for which she is concerned that happened last night.  History is obtained from the patient and her friend.  She was at work, and had gotten very anxious due to the fact that her coworkers were not following Covid precautions.  She then stated "I think I am going to have an episode, I think I might have a seizure."  She describes that she felt flushed, that her heart was racing, and lightheaded.  She describes tunnel vision as well as "spots in my vision."  She had shaking which was bilateral with preserved consciousness.  Her friend then assisted her to get to the supervisor's office.  When asked what had caused her to start this episode, she stated at the time that she had no memory of it.  At that point, she "broke down" by which the friend means that she began sobbing, appeared extremely anxious.  Overall the event lasted for approximately 30 minutes.  She did not lose consciousness while with her friend.  She states that she has "bits and pieces" of memory during this timeframe.  Subsequently with the supervisor, it is unclear to me what transpired as we were unable to reach them for further history and the patient states that she has only bits and pieces of memory during that time as well.  When she woke up this morning, she noticed she had a sore neck and had been incontinent of urine.    ROS: A 14 point ROS was performed and is negative except as noted in the HPI.   Past Medical History:  Diagnosis Date  . Ovarian cyst   . Pregnancy induced hypertension   . Recurrent tonsillitis 09/2017   current antibiotic, will finish 09/25/2017     Family History  Problem Relation Age  of Onset  . Cancer Mother   . Cancer Maternal Aunt   . Cancer Maternal Uncle   . Cancer Maternal Grandmother   . Stroke Maternal Grandmother   . Diabetes Maternal Grandmother      Social History:  reports that she has never smoked. She has never used smokeless tobacco. She reports that she does not drink alcohol or use drugs.   Exam: Current vital signs: BP 114/69   Pulse 86   Temp 98.1 F (36.7 C) (Oral)   Resp 18   SpO2 100%  Vital signs in last 24 hours: Temp:  [98.1 F (36.7 C)] 98.1 F (36.7 C) (02/10 1450) Pulse Rate:  [82-86] 86 (02/10 1630) Resp:  [18] 18 (02/10 1630) BP: (114-118)/(69-80) 114/69 (02/10 1630) SpO2:  [99 %-100 %] 100 % (02/10 1630)   Physical Exam  Constitutional: Appears well-developed and well-nourished.  Psych: Affect appropriate to situation Eyes: No scleral injection HENT: No OP obstrucion MSK: no joint deformities.  Cardiovascular: Normal rate and regular rhythm.  Respiratory: Effort normal, non-labored breathing GI: Soft.  No distension. There is no tenderness.  Skin: WDI  Neuro: Mental Status: Patient is awake, alert, oriented to person, place, month, year, and situation. Patient is able to give a clear and coherent history. No signs of aphasia or neglect Cranial Nerves: II: Visual Fields are full. Pupils are equal, round, and reactive to light.  III,IV, VI: EOMI without ptosis or diploplia.  V: She states that her left side of her face feels colder than the right when temperature is tested.  Intact to light touch. VII: Facial movement is symmetric.  VIII: hearing is intact to voice X: Uvula elevates symmetrically XI: Shoulder shrug is symmetric. XII: tongue is midline without atrophy or fasciculations.  Motor: Tone is normal. Bulk is normal. 5/5 strength was present in all four extremities.  Sensory: She states that cold temperature feels colder on the left than the right, intact light touch cerebellar: FNF and HKS are  intact bilaterally    I have reviewed labs in epic and the results pertinent to this consultation are: BMP-unremarkable CBC-unremarkable   I have reviewed the images obtained: CT head-negative.  Impression: 19 year old female with episode of lightheadedness, spots/tunnel vision and lightheadedness with poor memory of the event.  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  The fact that she has never had a seizure but felt like she was about to have one, the precipitation due to high anxiety situation, bilateral shaking with preserved consciousness, duration are all most consistent with psychogenic etiology.  One thing that I do note is that multiple symptoms that she described of her prodrome including tunnel vision, lightheadedness, flushed sensation are all associate with presyncope.  She has had several episodes of lightheadedness, and I do wonder if prazosin could be playing a role.  It is possible that she had presyncope with subsequent embellishment.  Even if this had been a first onset seizure, which I doubt, at this point she would have only had a single event (even if she had 2 events within 24 hours, for treatment purposes this is counted as a single event) and I would not favor starting antiepileptic therapy.  An outpatient EEG would be reasonable.  With her headaches and decreased temperature sensation on the right, I do think that further imaging would be prudent.  If she does have a focal lesion which could be a seizure focus, then this would change my index of suspicion considerably.  Recommendations: 1) MRI brain, MRV head 2) if negative, she can follow-up with outpatient neurology.   Roland Rack, MD [facility] [phone]  If 7pm- 7am, please page neurology on call as listed in [facility].

## 2019-09-24 NOTE — ED Notes (Signed)
Pt transported to CT ?

## 2019-09-24 NOTE — ED Notes (Addendum)
Pt reports having a seizure last night at work that lasted 15 minutes, and had another this morning. States she got a hot flash and got tunnel vision, then remembers waking up and was incontinent of urine. No hx of seizures. Pt states since seizure she has had muscle tightness in her neck and face.

## 2019-09-24 NOTE — Discharge Instructions (Signed)
The results of your work-up today were reassuring and we did not find any acute abnormalities on the imaging of your brain.  Your laboratory work was also reassuring.  Given your symptoms, it is recommended that you follow-up with a neurologist.  An ambulatory referral was placed.  The neurology office will contact you to schedule an appointment for follow-up.  Please return to the emergency department for any new or worsening symptoms in the meantime.

## 2019-09-24 NOTE — ED Provider Notes (Signed)
Medical screening examination/treatment/procedure(s) were conducted as a shared visit with non-physician practitioner(s) and myself.  I personally evaluated the patient during the encounter.    Patient was at work yesterday evening.  She became hot and lightheaded.  Coworkers reported she had seizure-like activity.  She reports prior to the episode she felt like she was kind of hot and flushed and did not feel good.  She tried to cool her self off with some water.  Does not really remember the seizure part of the episode and was told about it afterward.  She has had a persistent right-sided headache.  No visual changes or focal motor deficits.  Patient is alert and appropriate.  Normal ENT exam.  Normal motor exam.  Cognitive function and speech are normal.  Plan of management.   Arby Barrette, MD 09/26/19 1538

## 2019-10-07 ENCOUNTER — Ambulatory Visit: Payer: Medicaid Other | Admitting: Neurology

## 2019-10-07 ENCOUNTER — Other Ambulatory Visit: Payer: Self-pay

## 2019-10-07 ENCOUNTER — Encounter: Payer: Self-pay | Admitting: Neurology

## 2019-10-07 VITALS — BP 114/67 | HR 96 | Temp 97.8°F | Ht 62.0 in | Wt 155.0 lb

## 2019-10-07 DIAGNOSIS — R404 Transient alteration of awareness: Secondary | ICD-10-CM

## 2019-10-07 HISTORY — DX: Transient alteration of awareness: R40.4

## 2019-10-07 NOTE — Progress Notes (Signed)
PATIENT: Kristie Crawford DOB: 2001-02-05  Chief Complaint  Patient presents with  . Possible seizure    On 09/23/19, reports the following symptoms while at work: feeling hot, lightheaded, seeing spots, tightening of muscles, head jerking, difficulty speaking. Says event was witnessed by co-workers. She was sent home and woke up the following morning with tightened neck/facial muscles and had experienced a loss of bladder control during the night. Denies any prior history of seizure-like activity.   Marland Kitchen PCP    Nyoka Cowden, MD - referred from hospital     HISTORICAL  Kristie Crawford is a 19 years old female, seen in request by her primary care physician Dr. Nyoka Cowden for evaluation of passing out spells, initial evaluation was on October 07, 2019.  I have reviewed and summarized the referring note from the referring physician.  She had a history of anxiety, panic attack, was started on Vraylar 1.5 mg, prazosin 1 mg at bedtime since January 2021.  She reported the first day she took prazosin at 11 PM, while she get up in the middle of the night try to get a sip of water, she felt lightheaded, sees stars in her visual field, as soon as she opened the refrigerator door, she fell to the ground, passed out.  Since the initial event, she had frequent lightheadedness, since starting her visual field after taking prazosin, but it is manageable  On September 23, 2018, she was at work at [employer], has not taking her prazosin yet, in a standing position, she felt lightheadedness, generalized weakness, her whole body tensed up, upper nuchal area muscle tightness, as if she is going to have a panic attack, shortly afterwards, she also began to see spots in her visual field, much more than her usual symptoms, then she passed out on the floor, was witnessed by her coworker had body shaking, she was in and out lasting for 15 minutes, later she was able to get up, picked up by her  boyfriend back home, as soon as she reached home, she fell into sleep, when she will wake from sleep, she feels body tension, she also urinated on herself, she denies tongue biting  She presented to the emergency room, was seen by neuro hospitalist Dr. Amada Jupiter, had extensive evaluations  I personally reviewed MRI of the brain with without contrast September 24, 2019 that was normal CT venogram that was normal CT cervical spine showed no significant abnormality, MRV of the brain showed linear filling defect in the right lateral transverse sinus, the remainder of the venous is normal,  Laboratory evaluations on September 24, 2019, UDS was negative, CBC showed hemoglobin of 13.3, normal BMP,  She had no recurrent passing out spells since that event  REVIEW OF SYSTEMS: Full 14 system review of systems performed and notable only for as above All other review of systems were negative.  ALLERGIES: Allergies  Allergen Reactions  . Dexamethasone Swelling  . Ciprofloxacin Rash    HOME MEDICATIONS: Current Outpatient Medications  Medication Sig Dispense Refill  . prazosin (MINIPRESS) 1 MG capsule Take 1 mg by mouth at bedtime.    Marland Kitchen VRAYLAR capsule Take 1.5 mg by mouth daily.     No current facility-administered medications for this visit.    PAST MEDICAL HISTORY: Past Medical History:  Diagnosis Date  . Anxiety and depression   . Ovarian cyst   . Pregnancy induced hypertension   . PTSD (post-traumatic stress disorder)   . Recurrent tonsillitis  09/2017   current antibiotic, will finish 09/25/2017  . Seizure-like activity ([place])     PAST SURGICAL HISTORY: Past Surgical History:  Procedure Laterality Date  . CESAREAN SECTION N/A 03/31/2019   Procedure: CESAREAN SECTION;  Surgeon: Olga Millers, MD;  Location: [facility];  Service: Obstetrics;  Laterality: N/A;  . TONSILLECTOMY N/A 10/01/2017   Procedure: TONSILLECTOMY;  Surgeon: Izora Gala, MD;  Location: [place];  Service: ENT;  Laterality: N/A;  . WISDOM TOOTH EXTRACTION  10/2014    FAMILY HISTORY: Family History  Problem Relation Age of Onset  . Ovarian cancer Mother   . Skin cancer Mother        unsure of type - not melanoma  . Cancer Maternal Aunt   . Cancer Maternal Uncle   . Cancer Maternal Grandmother   . Stroke Maternal Grandmother   . Diabetes Maternal Grandmother   . Healthy Father     SOCIAL HISTORY: Social History   Socioeconomic History  . Marital status: Single    Spouse name: Not on file  . Number of children: 1  . Years of education: 63  . Highest education level: High school graduate  Occupational History  . Occupation: Scientist, water quality  Tobacco Use  . Smoking status: Never Smoker  . Smokeless tobacco: Never Used  Substance and Sexual Activity  . Alcohol use: No  . Drug use: No  . Sexual activity: Yes    Birth control/protection: None  Other Topics Concern  . Not on file  Social History Narrative   Lives at home with significant other and son.   Right-handed.   Occasional caffeine.   Social Determinants of Health   Financial Resource Strain:   . Difficulty of Paying Living Expenses: Not on file  Food Insecurity:   . Worried About Charity fundraiser in the Last Year: Not on file  . Ran Out of Food in the Last Year: Not on file  Transportation Needs:   . Lack of Transportation (Medical): Not on file  . Lack of Transportation (Non-Medical): Not on file  Physical Activity:   . Days of Exercise per Week: Not on file  . Minutes of Exercise per Session: Not on file  Stress:   . Feeling of Stress : Not on file  Social Connections:   . Frequency of Communication with Friends and Family: Not on file  . Frequency of Social Gatherings with Friends and Family: Not on file  . Attends Religious Services: Not on file  . Active Member of Clubs or Organizations: Not on file  . Attends Archivist Meetings: Not on file  . Marital Status: Not on file    Intimate Partner Violence:   . Fear of Current or Ex-Partner: Not on file  . Emotionally Abused: Not on file  . Physically Abused: Not on file  . Sexually Abused: Not on file     PHYSICAL EXAM   Vitals:   10/07/19 1042  BP: 114/67  Pulse: 96  Temp: 97.8 F (36.6 C)  Weight: 155 lb (70.3 kg)  Height: 5\' 2"  (1.575 m)    Not recorded      Body mass index is 28.35 kg/m.  PHYSICAL EXAMNIATION:  Gen: NAD, conversant, well nourised, well groomed                     Cardiovascular: Regular rate rhythm, no peripheral edema, warm, nontender. Eyes: Conjunctivae clear without exudates or hemorrhage Neck: Supple,  no carotid bruits. Pulmonary: Clear to auscultation bilaterally   NEUROLOGICAL EXAM:  MENTAL STATUS: Speech:    Speech is normal; fluent and spontaneous with normal comprehension.  Cognition:     Orientation to time, place and person     Normal recent and remote memory     Normal Attention span and concentration     Normal Language, naming, repeating,spontaneous speech     Fund of knowledge   CRANIAL NERVES: CN II: Visual fields are full to confrontation. Pupils are round equal and briskly reactive to light. CN III, IV, VI: extraocular movement are normal. No ptosis. CN V: Facial sensation is intact to light touch CN VII: Face is symmetric with normal eye closure  CN VIII: Hearing is normal to causal conversation. CN IX, X: Phonation is normal. CN XI: Head turning and shoulder shrug are intact  MOTOR: There is no pronator drift of out-stretched arms. Muscle bulk and tone are normal. Muscle strength is normal.  REFLEXES: Reflexes are 2+ and symmetric at the biceps, triceps, knees, and ankles. Plantar responses are flexor.  SENSORY: Intact to light touch, pinprick and vibratory sensation are intact in fingers and toes.  COORDINATION: There is no trunk or limb dysmetria noted.  GAIT/STANCE: Posture is normal. Gait is steady with normal steps, base, arm  swing, and turning. Heel and toe walking are normal. Tandem gait is normal.  Romberg is absent.   DIAGNOSTIC DATA (LABS, IMAGING, TESTING) - I reviewed patient records, labs, notes, testing and imaging myself where available.   ASSESSMENT AND PLAN  Kristie Crawford is a 19 y.o. female   Passing out spells  Differentiation diagnosis include syncope, versus seizure,  EEG  Document all spells  No driving until episode free for 6 months  Return to clinic with nurse practitioner Sarah in 6 months  Levert Feinstein, M.D. Ph.D.  [facility] [address], [address] [place], [place] [address] Ph: [phone] Fax: [phone]  CC: Nyoka Cowden, MD

## 2019-10-13 ENCOUNTER — Other Ambulatory Visit: Payer: Medicaid Other

## 2019-10-29 ENCOUNTER — Other Ambulatory Visit: Payer: Medicaid Other

## 2019-10-30 ENCOUNTER — Encounter: Payer: Self-pay | Admitting: Neurology

## 2019-11-26 ENCOUNTER — Other Ambulatory Visit: Payer: Self-pay

## 2019-11-26 ENCOUNTER — Ambulatory Visit: Payer: Medicaid Other | Admitting: Neurology

## 2019-11-26 DIAGNOSIS — R404 Transient alteration of awareness: Secondary | ICD-10-CM

## 2019-11-26 DIAGNOSIS — R55 Syncope and collapse: Secondary | ICD-10-CM

## 2019-12-02 NOTE — Procedures (Signed)
   HISTORY: 19 years old female, has passing out spells,  TECHNIQUE:  This is a routine 16 channel EEG recording with one channel devoted to a limited EKG recording.  It was performed during wakefulness, drowsiness and asleep.  Hyperventilation and photic stimulation were performed as activating procedures.  There are minimum muscle and movement artifact noted.  Upon maximum arousal, posterior dominant waking rhythm consistent of rhythmic alpha range activity, with frequency of 11 hz. Activities are symmetric over the bilateral posterior derivations and attenuated with eye opening.  Hyperventilation produced mild/moderate buildup with higher amplitude and the slower activities noted.  Photic stimulation did not alter the tracing.  During EEG recording, patient developed drowsiness and entered sleep, sleep EEG demonstrated architecture, there were frontal centrally dominant vertex waves and symmetric sleep spindles noted.  During EEG recording, there was no epileptiform discharge noted.  EKG demonstrate sinus rhythm, with heart rate of 80 bpm  CONCLUSION: This is a  normal awake and sleep EEG.  There is no electrodiagnostic evidence of epileptiform discharge.  Levert Feinstein, M.D. Ph.D.  [facility] [address] [place], [place] [address] Phone: [phone] Fax:      [phone]

## 2019-12-18 DIAGNOSIS — O09299 Supervision of pregnancy with other poor reproductive or obstetric history, unspecified trimester: Secondary | ICD-10-CM | POA: Insufficient documentation

## 2020-04-05 ENCOUNTER — Ambulatory Visit: Payer: Medicaid Other | Admitting: Neurology

## 2020-04-28 DIAGNOSIS — O09293 Supervision of pregnancy with other poor reproductive or obstetric history, third trimester: Secondary | ICD-10-CM | POA: Insufficient documentation

## 2020-05-18 DIAGNOSIS — Z98891 History of uterine scar from previous surgery: Secondary | ICD-10-CM | POA: Insufficient documentation

## 2020-07-07 DIAGNOSIS — Z30017 Encounter for initial prescription of implantable subdermal contraceptive: Secondary | ICD-10-CM | POA: Insufficient documentation

## 2020-07-20 NOTE — Telephone Encounter (Signed)
I have called her, she sometimes has left retrorbital pressure, jaw tightness, light, noise sensitive, it happens 2/month.  I have suggested her to try Aleve as needed. The reported history is suggestive of migraine

## 2020-10-18 DIAGNOSIS — M79602 Pain in left arm: Secondary | ICD-10-CM | POA: Diagnosis not present

## 2020-10-18 DIAGNOSIS — F4310 Post-traumatic stress disorder, unspecified: Secondary | ICD-10-CM | POA: Diagnosis not present

## 2020-10-18 DIAGNOSIS — Z711 Person with feared health complaint in whom no diagnosis is made: Secondary | ICD-10-CM | POA: Diagnosis not present

## 2020-10-23 DIAGNOSIS — F331 Major depressive disorder, recurrent, moderate: Secondary | ICD-10-CM | POA: Diagnosis not present

## 2020-11-06 ENCOUNTER — Other Ambulatory Visit: Payer: Self-pay

## 2020-11-06 ENCOUNTER — Encounter (HOSPITAL_BASED_OUTPATIENT_CLINIC_OR_DEPARTMENT_OTHER): Payer: Self-pay | Admitting: *Deleted

## 2020-11-06 ENCOUNTER — Emergency Department (HOSPITAL_BASED_OUTPATIENT_CLINIC_OR_DEPARTMENT_OTHER)
Admission: EM | Admit: 2020-11-06 | Discharge: 2020-11-06 | Disposition: A | Discharge status: Home / Self Care | Payer: BC Managed Care – PPO | Attending: Emergency Medicine | Admitting: Emergency Medicine

## 2020-11-06 DIAGNOSIS — N939 Abnormal uterine and vaginal bleeding, unspecified: Secondary | ICD-10-CM | POA: Diagnosis not present

## 2020-11-06 DIAGNOSIS — N390 Urinary tract infection, site not specified: Secondary | ICD-10-CM | POA: Diagnosis not present

## 2020-11-06 DIAGNOSIS — R300 Dysuria: Secondary | ICD-10-CM | POA: Diagnosis not present

## 2020-11-06 LAB — URINALYSIS, ROUTINE W REFLEX MICROSCOPIC
Bilirubin Urine: NEGATIVE
Glucose, UA: NEGATIVE mg/dL
Ketones, ur: NEGATIVE mg/dL
Nitrite: NEGATIVE
Protein, ur: NEGATIVE mg/dL
Specific Gravity, Urine: >1.03 — ABNORMAL HIGH (ref 1.005–1.030)
pH: 5.5 (ref 5.0–8.0)

## 2020-11-06 LAB — URINALYSIS, MICROSCOPIC (REFLEX)

## 2020-11-06 LAB — PREGNANCY, URINE: Preg Test, Ur: NEGATIVE

## 2020-11-06 MED ORDER — CEPHALEXIN 500 MG PO CAPS: 500.0000 mg | ORAL_CAPSULE | Freq: Two times a day (BID) | ORAL | 0 refills | Status: AC

## 2020-11-06 NOTE — ED Triage Notes (Signed)
Pt reports implant birth control placed mid November. States she has been on her period x 9 weeks. She talked to her OB GYN 2 weeks ago and was prescribed estrogen which made bleeding worse initially but then it stopped on Thursday. Now she states she is spotting when she pees and feels like a UTI

## 2020-11-06 NOTE — ED Provider Notes (Signed)
[facility] EMERGENCY DEPARTMENT Provider Note   CSN: [id number] Arrival date & time: 11/06/20  1800     History Chief Complaint  Patient presents with  . Vaginal Bleeding    Kristie Crawford is a 20 y.o. female.  20 year old female presents with complaint of dysuria, frequency, blood in her urine x2 days.  States that she felt like she may been feverish the first day of symptoms but not since.  No vomiting.  No other complaints or concerns.  History of kidney stones.        Past Medical History:  Diagnosis Date  . Anxiety and depression   . Ovarian cyst   . Pregnancy induced hypertension   . PTSD (post-traumatic stress disorder)   . Recurrent tonsillitis 09/2017   current antibiotic, will finish 09/25/2017  . Seizure-like activity [facility])     Patient Active Problem List   Diagnosis Date Noted  . Alteration consciousness 10/07/2019  . HELLP (hemolytic anemia/elev liver enzymes/low platelets in pregnancy) 03/31/2019  . Gestational thrombocytopenia ([facility]) 03/29/2019  . Labor and delivery, indication for care 03/29/2019  . History of dental surgery-wisdom teeth extraction 01/03/2015  . Facial abscess 01/02/2015  . Trismus   . Allergic rhinitis, seasonal 07/28/2014    Past Surgical History:  Procedure Laterality Date  . CESAREAN SECTION N/A 03/31/2019   Procedure: CESAREAN SECTION;  Surgeon: Levi Aland, MD;  Location: [facility];  Service: Obstetrics;  Laterality: N/A;  . TONSILLECTOMY N/A 10/01/2017   Procedure: TONSILLECTOMY;  Surgeon: Serena Colonel, MD;  Location: [facility];  Service: ENT;  Laterality: N/A;  . WISDOM TOOTH EXTRACTION  10/2014     OB History    Gravida  1   Para  1   Term      Preterm  1   AB      Living  1     SAB      IAB      Ectopic      Multiple  0   Live Births  1           Family History  Problem Relation Age of Onset  . Ovarian cancer Mother   . Skin cancer Mother        unsure of  type - not melanoma  . Cancer Maternal Aunt   . Cancer Maternal Uncle   . Cancer Maternal Grandmother   . Stroke Maternal Grandmother   . Diabetes Maternal Grandmother   . Healthy Father     Social History   Tobacco Use  . Smoking status: Never Smoker  . Smokeless tobacco: Never Used  Vaping Use  . Vaping Use: Never used  Substance Use Topics  . Alcohol use: No  . Drug use: No    Home Medications Prior to Admission medications   Medication Sig Start Date End Date Taking? Authorizing Provider  cephALEXin (KEFLEX) 500 MG capsule Take 1 capsule (500 mg total) by mouth 2 (two) times daily for 5 days. 11/06/20 11/11/20 Yes Jeannie Fend, PA-C  prazosin (MINIPRESS) 1 MG capsule Take 1 mg by mouth at bedtime. 08/23/19   [provider]  VRAYLAR capsule Take 1.5 mg by mouth daily. 09/18/19   [provider]    Allergies    Dexamethasone and Ciprofloxacin  Review of Systems   Review of Systems  Constitutional: Negative for fever.  Gastrointestinal: Negative for abdominal pain, nausea and vomiting.  Genitourinary: Positive for dysuria and frequency. Negative for  flank pain and vaginal bleeding.  Musculoskeletal: Negative for back pain.  Skin: Negative for wound.  Allergic/Immunologic: Negative for immunocompromised state.  Neurological: Negative for weakness.  All other systems reviewed and are negative.   Physical Exam Updated Vital Signs BP 125/65 (BP Location: Left Arm)   Pulse 66   Temp 98.2 F (36.8 C) (Oral)   Resp 18   Ht 5\' 2"  (1.575 m)   Wt 70.7 kg   SpO2 100%   Breastfeeding No   BMI 28.50 kg/m   Physical Exam Vitals and nursing note reviewed.  Constitutional:      General: She is not in acute distress.    Appearance: She is well-developed. She is not diaphoretic.  HENT:     Head: Normocephalic and atraumatic.  Cardiovascular:     Rate and Rhythm: Normal rate and regular rhythm.     Pulses: Normal pulses.     Heart sounds: Normal heart  sounds.  Pulmonary:     Effort: Pulmonary effort is normal.     Breath sounds: Normal breath sounds.  Abdominal:     Palpations: Abdomen is soft.     Tenderness: There is no abdominal tenderness. There is no right CVA tenderness or left CVA tenderness.  Skin:    General: Skin is warm and dry.     Findings: No erythema or rash.  Neurological:     Mental Status: She is alert and oriented to person, place, and time.  Psychiatric:        Behavior: Behavior normal.     ED Results / Procedures / Treatments   Labs (all labs ordered are listed, but only abnormal results are displayed) Labs Reviewed  URINALYSIS, ROUTINE W REFLEX MICROSCOPIC - Abnormal; Notable for the following components:      Result Value   Specific Gravity, Urine >1.030 (*)    Hgb urine dipstick MODERATE (*)    Leukocytes,Ua TRACE (*)    All other components within normal limits  URINALYSIS, MICROSCOPIC (REFLEX) - Abnormal; Notable for the following components:   Bacteria, UA MANY (*)    All other components within normal limits  PREGNANCY, URINE    EKG None  Radiology No results found.  Procedures Procedures   Medications Ordered in ED Medications - No data to display  ED Course  I have reviewed the triage vital signs and the nursing notes.  Pertinent labs & imaging results that were available during my care of the patient were reviewed by me and considered in my medical decision making (see chart for details).  Clinical Course as of 11/06/20 1950  Sat Nov 06, 2020  6836 20 year old female with complaint of dysuria and frequency x2 days.  On exam, no CVA tenderness, abdomen soft nontender.  Pending urinalysis and U pregnant. [LM]  1950 Urinalysis suggestive of UTI with trace leukocytes, moderate hemoglobin, many bacteria.  Patient is given prescription for Keflex, recommend recheck with PCP after completion to be sure blood in urine has resolved. [LM]    Clinical Course User Index [LM] 36   MDM Rules/Calculators/A&P                          Final Clinical Impression(s) / ED Diagnoses Final diagnoses:  Urinary tract infection in female    Rx / DC Orders ED Discharge Orders         Ordered    cephALEXin (KEFLEX) 500 MG capsule  2 times  daily        11/06/20 1949           Alden Hipp 11/06/20 1951    Milagros Loll, MD 11/07/20 [phone]

## 2020-11-06 NOTE — Discharge Instructions (Addendum)
Take antibiotics as prescribed and complete the full course.  Recheck with your doctor after completion of antibiotics to make sure infection and blood in urine have cleared.

## 2020-11-27 DIAGNOSIS — F331 Major depressive disorder, recurrent, moderate: Secondary | ICD-10-CM | POA: Diagnosis not present

## 2020-11-29 ENCOUNTER — Other Ambulatory Visit: Payer: Self-pay

## 2020-11-29 DIAGNOSIS — M791 Myalgia, unspecified site: Secondary | ICD-10-CM | POA: Insufficient documentation

## 2020-11-29 DIAGNOSIS — R21 Rash and other nonspecific skin eruption: Secondary | ICD-10-CM | POA: Diagnosis not present

## 2020-11-30 ENCOUNTER — Emergency Department (HOSPITAL_COMMUNITY)
Admission: EM | Admit: 2020-11-30 | Discharge: 2020-11-30 | Disposition: A | Discharge status: Home / Self Care | Payer: BC Managed Care – PPO | Attending: Emergency Medicine | Admitting: Emergency Medicine

## 2020-11-30 ENCOUNTER — Other Ambulatory Visit: Payer: Self-pay

## 2020-11-30 ENCOUNTER — Encounter (HOSPITAL_COMMUNITY): Payer: Self-pay

## 2020-11-30 DIAGNOSIS — R21 Rash and other nonspecific skin eruption: Secondary | ICD-10-CM

## 2020-11-30 DIAGNOSIS — M791 Myalgia, unspecified site: Secondary | ICD-10-CM

## 2020-11-30 LAB — I-STAT CHEM 8, ED
BUN: 16 mg/dL (ref 6–20)
Calcium, Ion: 1.23 mmol/L (ref 1.15–1.40)
Chloride: 103 mmol/L (ref 98–111)
Creatinine, Ser: 0.6 mg/dL (ref 0.44–1.00)
Glucose, Bld: 90 mg/dL (ref 70–99)
HCT: 42 % (ref 36.0–46.0)
Hemoglobin: 14.3 g/dL (ref 12.0–15.0)
Potassium: 4.1 mmol/L (ref 3.5–5.1)
Sodium: 139 mmol/L (ref 135–145)
TCO2: 25 mmol/L (ref 22–32)

## 2020-11-30 NOTE — ED Triage Notes (Signed)
Pt reports rash for around 3 weeks. Pt reports spots on left arm, back and legs.

## 2020-11-30 NOTE — Discharge Instructions (Addendum)
You were seen today for rash and occasional muscle pain.  The cause of your symptoms is unknown at this time.  Your rash does appear most consistent with some sort of allergic response and local reaction.  It is most consistent with potential bites.  Make sure to wash all bedding and clothes in hot water.  Also avoid any additional dyes or anything that you may be reacting to.  If the rash persists or symptoms persist, you should follow-up with your primary physician for further work-up.  You may ultimately need a rheumatologic work-up if not improving.

## 2020-11-30 NOTE — ED Provider Notes (Signed)
[facility]-EMERGENCY DEPT Provider Note   CSN: [id number] Arrival date & time: 11/29/20  2355     History No chief complaint on file.   Simranjit Thayer is a 20 y.o. female.  HPI     This is a 20 year old female with a history of PTSD, pregnancy-induced hypertension who presents with rash.  Patient reports 3-week history of rash.  She states the rash has migrated.  It has involved bilateral upper extremities, bilateral lower extremities, trunk.  It has mostly spared the face.  She describes it as itchy.  She has been using Epson salt baths which seem to help some with the itchiness.  She is not had any fevers.  She states she has also had intermittent episodes of squeezing-like pain in the bilateral arms and subsequent tingling.  She reports that no one else in her house has a rash.  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  She denies any new detergents or soaps.  Past Medical History:  Diagnosis Date  . Anxiety and depression   . Ovarian cyst   . Pregnancy induced hypertension   . PTSD (post-traumatic stress disorder)   . Recurrent tonsillitis 09/2017   current antibiotic, will finish 09/25/2017  . Seizure-like activity [facility])     Patient Active Problem List   Diagnosis Date Noted  . Alteration consciousness 10/07/2019  . HELLP (hemolytic anemia/elev liver enzymes/low platelets in pregnancy) 03/31/2019  . Gestational thrombocytopenia (HCC) 03/29/2019  . Labor and delivery, indication for care 03/29/2019  . History of dental surgery-wisdom teeth extraction 01/03/2015  . Facial abscess 01/02/2015  . Trismus   . Allergic rhinitis, seasonal 07/28/2014    Past Surgical History:  Procedure Laterality Date  . CESAREAN SECTION N/A 03/31/2019   Procedure: CESAREAN SECTION;  Surgeon: Levi Aland, MD;  [facility];  Service: Obstetrics;  Laterality: N/A;  . TONSILLECTOMY N/A 10/01/2017   Procedure:  TONSILLECTOMY;  Surgeon: Serena Colonel, MD;  Location: [facility];  Service: ENT;  Laterality: N/A;  . TONSILLECTOMY    . WISDOM TOOTH EXTRACTION  10/2014     OB History    Gravida  1   Para  1   Term      Preterm  1   AB      Living  1     SAB      IAB      Ectopic      Multiple  0   Live Births  1           Family History  Problem Relation Age of Onset  . Ovarian cancer Mother   . Skin cancer Mother        unsure of type - not melanoma  . Cancer Maternal Aunt   . Cancer Maternal Uncle   . Cancer Maternal Grandmother   . Stroke Maternal Grandmother   . Diabetes Maternal Grandmother   . Healthy Father     Social History   Tobacco Use  . Smoking status: Never Smoker  . Smokeless tobacco: Never Used  Vaping Use  . Vaping Use: Never used  Substance Use Topics  . Alcohol use: No  . Drug use: No    Home Medications Prior to Admission medications   Medication Sig Start Date End Date Taking? Authorizing Provider  prazosin (MINIPRESS) 1 MG capsule Take 1 mg by mouth at bedtime. 08/23/19   [provider]  VRAYLAR capsule Take 1.5 mg by mouth daily. 09/18/19   [provider]    Allergies    Dexamethasone, Cariprazine, and Ciprofloxacin  Review of Systems   Review of Systems  Constitutional: Negative for fever.  Respiratory: Negative for shortness of breath.   Cardiovascular: Negative for chest pain.  Gastrointestinal: Negative for nausea and vomiting.  Musculoskeletal: Positive for myalgias.  Skin: Positive for rash.  Neurological: Negative for weakness.  All other systems reviewed and are negative.   Physical Exam Updated Vital Signs BP 123/77 (BP Location: Left Arm)   Pulse 80   Temp 97.9 F (36.6 C) (Oral)   Resp 18   Ht 1.575 m (5\' 2" )   Wt 61.2 kg   SpO2 100%   BMI 24.69 kg/m   Physical Exam Vitals and nursing note reviewed.  Constitutional:      Appearance: She is well-developed. She is not  ill-appearing.  HENT:     Head: Normocephalic and atraumatic.     Nose: Nose normal.     Mouth/Throat:     Mouth: Mucous membranes are moist.  Eyes:     Pupils: Pupils are equal, round, and reactive to light.  Cardiovascular:     Rate and Rhythm: Normal rate and regular rhythm.     Heart sounds: Normal heart sounds.  Pulmonary:     Effort: Pulmonary effort is normal. No respiratory distress.     Breath sounds: Normal breath sounds. No wheezing.  Abdominal:     Palpations: Abdomen is soft.     Tenderness: There is no abdominal tenderness.  Musculoskeletal:        General: No swelling or tenderness. Normal range of motion.     Cervical back: Neck supple.  Skin:    General: Skin is warm and dry.     Comments: Multiple slightly raised erythematous papules/patches bilateral upper extremities, right flank, blanching, some with overlying excoriation  Neurological:     Mental Status: She is alert and oriented to person, place, and time.  Psychiatric:        Mood and Affect: Mood normal.     ED Results / Procedures / Treatments   Labs (all labs ordered are listed, but only abnormal results are displayed) Labs Reviewed  I-STAT CHEM 8, ED    EKG None  Radiology No results found.  Procedures Procedures   Medications Ordered in ED Medications - No data to display  ED Course  I have reviewed the triage vital signs and the nursing notes.  Pertinent labs & imaging results that were available during my care of the patient were reviewed by me and considered in my medical decision making (see chart for details).    MDM Rules/Calculators/A&P                          Patient presents with rash.  She is overall nontoxic and vital signs are reassuring.  She is afebrile.  Rash has been described as migratory and ongoing for the last 3 weeks.  On exam she has multiple small papules raised plaques that are erythematous.  They are most consistent with local reaction to potential bite.   They are not confluent.  There is no evidence of overlying infection or cellulitis.  There is no desquamation to suggest SJS or other etiology.  I had a Dickerman discussion with the patient.  She is subsequently ruminated more on the myalgias that she is having.  Discussed with her that it may be unrelated.  We will check a quick Chem-8 to make sure her potassium metabolites are reassuring.  Chem-8 is normal.  Potassium is 4.1.  She is otherwise healthy.  I find this quite reassuring.  Patient expresses frustration that she does not have an answer.  She does not believe these are bites.  I discussed with her that I felt that it was reasonable for her to go and wash all her clothing and bedding in hot water and avoid dyes and perfumes.  If her symptoms do not improve, she should follow-up with her primary physician for outpatient evaluation and work-up.  Given that the rash is itchy, this suggest an IgE mediated response; however, there is a possibility this could be an atypical presentation of a rheumatologic condition.  This is not something that we can work-up in the emergency department.  Again I expressed with the patient that close follow-up if her symptoms or not improving would be important.  Patient reports that she is displeased with her care.  After history, exam, and medical workup I feel the patient has been appropriately medically screened and is safe for discharge home. Pertinent diagnoses were discussed with the patient. Patient was given return precautions.  Final Clinical Impression(s) / ED Diagnoses Final diagnoses:  Rash  Myalgia    Rx / DC Orders ED Discharge Orders    None       Damyia Strider, Mayer Masker, MD 11/30/20 [phone]

## 2020-12-06 DIAGNOSIS — R531 Weakness: Secondary | ICD-10-CM | POA: Diagnosis not present

## 2020-12-06 DIAGNOSIS — L209 Atopic dermatitis, unspecified: Secondary | ICD-10-CM | POA: Diagnosis not present

## 2020-12-06 DIAGNOSIS — Z111 Encounter for screening for respiratory tuberculosis: Secondary | ICD-10-CM | POA: Diagnosis not present

## 2020-12-06 DIAGNOSIS — L650 Telogen effluvium: Secondary | ICD-10-CM | POA: Diagnosis not present

## 2020-12-15 DIAGNOSIS — K529 Noninfective gastroenteritis and colitis, unspecified: Secondary | ICD-10-CM | POA: Diagnosis not present

## 2020-12-20 DIAGNOSIS — M7989 Other specified soft tissue disorders: Secondary | ICD-10-CM | POA: Diagnosis not present

## 2020-12-25 DIAGNOSIS — F331 Major depressive disorder, recurrent, moderate: Secondary | ICD-10-CM | POA: Diagnosis not present

## 2021-01-18 DIAGNOSIS — M79651 Pain in right thigh: Secondary | ICD-10-CM | POA: Diagnosis not present

## 2021-01-18 DIAGNOSIS — M79602 Pain in left arm: Secondary | ICD-10-CM | POA: Diagnosis not present

## 2021-01-18 DIAGNOSIS — M791 Myalgia, unspecified site: Secondary | ICD-10-CM | POA: Diagnosis not present

## 2021-01-18 DIAGNOSIS — F17290 Nicotine dependence, other tobacco product, uncomplicated: Secondary | ICD-10-CM | POA: Diagnosis not present

## 2021-01-18 DIAGNOSIS — Z881 Allergy status to other antibiotic agents status: Secondary | ICD-10-CM | POA: Diagnosis not present

## 2021-01-18 DIAGNOSIS — Z882 Allergy status to sulfonamides status: Secondary | ICD-10-CM | POA: Diagnosis not present

## 2021-01-18 DIAGNOSIS — M79601 Pain in right arm: Secondary | ICD-10-CM | POA: Diagnosis not present

## 2021-01-18 DIAGNOSIS — L659 Nonscarring hair loss, unspecified: Secondary | ICD-10-CM | POA: Diagnosis not present

## 2021-01-18 DIAGNOSIS — Z888 Allergy status to other drugs, medicaments and biological substances status: Secondary | ICD-10-CM | POA: Diagnosis not present

## 2021-01-27 DIAGNOSIS — F331 Major depressive disorder, recurrent, moderate: Secondary | ICD-10-CM | POA: Diagnosis not present

## 2021-02-02 DIAGNOSIS — R6883 Chills (without fever): Secondary | ICD-10-CM | POA: Diagnosis not present

## 2021-02-02 DIAGNOSIS — R110 Nausea: Secondary | ICD-10-CM | POA: Diagnosis not present

## 2021-02-02 DIAGNOSIS — K5900 Constipation, unspecified: Secondary | ICD-10-CM | POA: Diagnosis not present

## 2021-02-02 DIAGNOSIS — R109 Unspecified abdominal pain: Secondary | ICD-10-CM | POA: Diagnosis not present

## 2021-02-21 DIAGNOSIS — G4459 Other complicated headache syndrome: Secondary | ICD-10-CM | POA: Diagnosis not present

## 2021-02-21 DIAGNOSIS — Z888 Allergy status to other drugs, medicaments and biological substances status: Secondary | ICD-10-CM | POA: Diagnosis not present

## 2021-02-21 DIAGNOSIS — Z8669 Personal history of other diseases of the nervous system and sense organs: Secondary | ICD-10-CM | POA: Diagnosis not present

## 2021-02-21 DIAGNOSIS — Z882 Allergy status to sulfonamides status: Secondary | ICD-10-CM | POA: Diagnosis not present

## 2021-02-21 DIAGNOSIS — H538 Other visual disturbances: Secondary | ICD-10-CM | POA: Diagnosis not present

## 2021-02-21 DIAGNOSIS — R519 Headache, unspecified: Secondary | ICD-10-CM | POA: Diagnosis not present

## 2021-02-21 DIAGNOSIS — R202 Paresthesia of skin: Secondary | ICD-10-CM | POA: Diagnosis not present

## 2021-02-26 DIAGNOSIS — Z3202 Encounter for pregnancy test, result negative: Secondary | ICD-10-CM | POA: Diagnosis not present

## 2021-02-26 DIAGNOSIS — N3000 Acute cystitis without hematuria: Secondary | ICD-10-CM | POA: Diagnosis not present

## 2021-02-28 DIAGNOSIS — N390 Urinary tract infection, site not specified: Secondary | ICD-10-CM | POA: Diagnosis not present

## 2021-02-28 DIAGNOSIS — R3989 Other symptoms and signs involving the genitourinary system: Secondary | ICD-10-CM | POA: Diagnosis not present

## 2021-02-28 DIAGNOSIS — Z883 Allergy status to other anti-infective agents status: Secondary | ICD-10-CM | POA: Diagnosis not present

## 2021-03-01 DIAGNOSIS — T7840XS Allergy, unspecified, sequela: Secondary | ICD-10-CM | POA: Diagnosis not present

## 2021-03-01 DIAGNOSIS — R071 Chest pain on breathing: Secondary | ICD-10-CM | POA: Diagnosis not present

## 2021-03-04 DIAGNOSIS — Z28310 Unvaccinated for covid-19: Secondary | ICD-10-CM | POA: Diagnosis not present

## 2021-03-04 DIAGNOSIS — R531 Weakness: Secondary | ICD-10-CM | POA: Diagnosis not present

## 2021-03-04 DIAGNOSIS — Z881 Allergy status to other antibiotic agents status: Secondary | ICD-10-CM | POA: Diagnosis not present

## 2021-03-04 DIAGNOSIS — R0981 Nasal congestion: Secondary | ICD-10-CM | POA: Diagnosis not present

## 2021-03-04 DIAGNOSIS — F17290 Nicotine dependence, other tobacco product, uncomplicated: Secondary | ICD-10-CM | POA: Diagnosis not present

## 2021-03-04 DIAGNOSIS — Z87440 Personal history of urinary (tract) infections: Secondary | ICD-10-CM | POA: Diagnosis not present

## 2021-03-04 DIAGNOSIS — R079 Chest pain, unspecified: Secondary | ICD-10-CM | POA: Diagnosis not present

## 2021-03-04 DIAGNOSIS — U071 COVID-19: Secondary | ICD-10-CM | POA: Diagnosis not present

## 2021-03-04 DIAGNOSIS — R0789 Other chest pain: Secondary | ICD-10-CM | POA: Diagnosis not present

## 2021-03-04 DIAGNOSIS — Z888 Allergy status to other drugs, medicaments and biological substances status: Secondary | ICD-10-CM | POA: Diagnosis not present

## 2021-03-04 DIAGNOSIS — I498 Other specified cardiac arrhythmias: Secondary | ICD-10-CM | POA: Diagnosis not present

## 2021-03-12 DIAGNOSIS — F331 Major depressive disorder, recurrent, moderate: Secondary | ICD-10-CM | POA: Diagnosis not present

## 2021-04-04 DIAGNOSIS — R5383 Other fatigue: Secondary | ICD-10-CM | POA: Diagnosis not present

## 2021-04-04 DIAGNOSIS — R569 Unspecified convulsions: Secondary | ICD-10-CM | POA: Diagnosis not present

## 2021-04-04 DIAGNOSIS — R253 Fasciculation: Secondary | ICD-10-CM | POA: Diagnosis not present

## 2021-04-04 DIAGNOSIS — R001 Bradycardia, unspecified: Secondary | ICD-10-CM | POA: Diagnosis not present

## 2021-04-04 DIAGNOSIS — R9431 Abnormal electrocardiogram [ECG] [EKG]: Secondary | ICD-10-CM | POA: Diagnosis not present

## 2021-05-03 DIAGNOSIS — R252 Cramp and spasm: Secondary | ICD-10-CM | POA: Diagnosis not present

## 2021-05-03 DIAGNOSIS — E7212 Methylenetetrahydrofolate reductase deficiency: Secondary | ICD-10-CM | POA: Diagnosis not present

## 2021-05-04 DIAGNOSIS — Z1589 Genetic susceptibility to other disease: Secondary | ICD-10-CM | POA: Insufficient documentation

## 2021-06-02 DIAGNOSIS — R252 Cramp and spasm: Secondary | ICD-10-CM | POA: Diagnosis not present

## 2021-06-11 DIAGNOSIS — R52 Pain, unspecified: Secondary | ICD-10-CM | POA: Diagnosis not present

## 2021-06-11 DIAGNOSIS — J029 Acute pharyngitis, unspecified: Secondary | ICD-10-CM | POA: Diagnosis not present

## 2021-06-11 DIAGNOSIS — R109 Unspecified abdominal pain: Secondary | ICD-10-CM | POA: Diagnosis not present

## 2021-06-11 DIAGNOSIS — Z6830 Body mass index (BMI) 30.0-30.9, adult: Secondary | ICD-10-CM | POA: Diagnosis not present

## 2021-11-20 ENCOUNTER — Other Ambulatory Visit: Payer: Self-pay

## 2021-11-20 ENCOUNTER — Emergency Department (HOSPITAL_BASED_OUTPATIENT_CLINIC_OR_DEPARTMENT_OTHER)
Admission: EM | Admit: 2021-11-20 | Discharge: 2021-11-21 | Disposition: A | Discharge status: Home / Self Care | Payer: Medicaid Other | Attending: Emergency Medicine | Admitting: Emergency Medicine

## 2021-11-20 ENCOUNTER — Encounter (HOSPITAL_BASED_OUTPATIENT_CLINIC_OR_DEPARTMENT_OTHER): Payer: Self-pay | Admitting: Emergency Medicine

## 2021-11-20 DIAGNOSIS — Y9383 Activity, rough housing and horseplay: Secondary | ICD-10-CM | POA: Diagnosis not present

## 2021-11-20 DIAGNOSIS — M62838 Other muscle spasm: Secondary | ICD-10-CM | POA: Diagnosis not present

## 2021-11-20 DIAGNOSIS — H5711 Ocular pain, right eye: Secondary | ICD-10-CM | POA: Insufficient documentation

## 2021-11-20 DIAGNOSIS — W500XXA Accidental hit or strike by another person, initial encounter: Secondary | ICD-10-CM | POA: Insufficient documentation

## 2021-11-20 DIAGNOSIS — M542 Cervicalgia: Secondary | ICD-10-CM | POA: Diagnosis present
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
## 2021-11-21 ENCOUNTER — Emergency Department (HOSPITAL_BASED_OUTPATIENT_CLINIC_OR_DEPARTMENT_OTHER): Payer: Medicaid Other

## 2021-11-21 MED ORDER — NAPROXEN 250 MG PO TABS
500.0000 mg | ORAL_TABLET | Freq: Once | ORAL | Status: AC
  Administered 2021-11-21: 500 mg via ORAL
  Filled 2021-11-21: qty 2

## 2021-11-21 MED ORDER — CYCLOBENZAPRINE HCL 10 MG PO TABS
10.0000 mg | ORAL_TABLET | Freq: Once | ORAL | Status: AC
Start: 2021-11-21 — End: 2021-11-21
  Administered 2021-11-21: 10 mg via ORAL
  Filled 2021-11-21: qty 1

## 2021-11-21 MED ORDER — CYCLOBENZAPRINE HCL 10 MG PO TABS: 10.0000 mg | ORAL_TABLET | Freq: Three times a day (TID) | ORAL | 0 refills | Status: DC | PRN

## 2022-01-13 ENCOUNTER — Inpatient Hospital Stay (HOSPITAL_COMMUNITY)
Admission: EM | Admit: 2022-01-13 | Discharge: 2022-01-15 | DRG: 918 | Disposition: A | Discharge status: Other Acute Inpatient Hospital | Payer: Medicaid Other | Attending: Internal Medicine | Admitting: Internal Medicine

## 2022-01-13 ENCOUNTER — Encounter (HOSPITAL_COMMUNITY): Payer: Self-pay | Admitting: *Deleted

## 2022-01-13 ENCOUNTER — Other Ambulatory Visit: Payer: Self-pay

## 2022-01-13 DIAGNOSIS — F29 Unspecified psychosis not due to a substance or known physiological condition: Secondary | ICD-10-CM | POA: Diagnosis not present

## 2022-01-13 DIAGNOSIS — T50902A Poisoning by unspecified drugs, medicaments and biological substances, intentional self-harm, initial encounter: Secondary | ICD-10-CM

## 2022-01-13 DIAGNOSIS — T391X2A Poisoning by 4-Aminophenol derivatives, intentional self-harm, initial encounter: Secondary | ICD-10-CM | POA: Diagnosis not present

## 2022-01-13 DIAGNOSIS — R45851 Suicidal ideations: Secondary | ICD-10-CM

## 2022-01-13 DIAGNOSIS — Z20822 Contact with and (suspected) exposure to covid-19: Secondary | ICD-10-CM | POA: Diagnosis present

## 2022-01-13 DIAGNOSIS — F4310 Post-traumatic stress disorder, unspecified: Secondary | ICD-10-CM | POA: Diagnosis present

## 2022-01-13 DIAGNOSIS — T50901A Poisoning by unspecified drugs, medicaments and biological substances, accidental (unintentional), initial encounter: Secondary | ICD-10-CM | POA: Diagnosis present

## 2022-01-13 DIAGNOSIS — Z823 Family history of stroke: Secondary | ICD-10-CM

## 2022-01-13 DIAGNOSIS — T483X2A Poisoning by antitussives, intentional self-harm, initial encounter: Secondary | ICD-10-CM | POA: Diagnosis present

## 2022-01-13 DIAGNOSIS — T50904A Poisoning by unspecified drugs, medicaments and biological substances, undetermined, initial encounter: Secondary | ICD-10-CM | POA: Diagnosis not present

## 2022-01-13 DIAGNOSIS — T450X2A Poisoning by antiallergic and antiemetic drugs, intentional self-harm, initial encounter: Secondary | ICD-10-CM | POA: Diagnosis present

## 2022-01-13 DIAGNOSIS — F339 Major depressive disorder, recurrent, unspecified: Secondary | ICD-10-CM | POA: Diagnosis not present

## 2022-01-13 DIAGNOSIS — R55 Syncope and collapse: Secondary | ICD-10-CM | POA: Diagnosis not present

## 2022-01-13 DIAGNOSIS — Z8041 Family history of malignant neoplasm of ovary: Secondary | ICD-10-CM | POA: Diagnosis not present

## 2022-01-13 DIAGNOSIS — Z9189 Other specified personal risk factors, not elsewhere classified: Secondary | ICD-10-CM | POA: Diagnosis present

## 2022-01-13 DIAGNOSIS — Z833 Family history of diabetes mellitus: Secondary | ICD-10-CM

## 2022-01-13 DIAGNOSIS — Z9151 Personal history of suicidal behavior: Secondary | ICD-10-CM | POA: Diagnosis not present

## 2022-01-13 DIAGNOSIS — F410 Panic disorder [episodic paroxysmal anxiety] without agoraphobia: Secondary | ICD-10-CM | POA: Diagnosis not present

## 2022-01-13 DIAGNOSIS — T887XXA Unspecified adverse effect of drug or medicament, initial encounter: Secondary | ICD-10-CM | POA: Diagnosis not present

## 2022-01-13 DIAGNOSIS — R1111 Vomiting without nausea: Secondary | ICD-10-CM | POA: Diagnosis not present

## 2022-01-13 DIAGNOSIS — T43222A Poisoning by selective serotonin reuptake inhibitors, intentional self-harm, initial encounter: Secondary | ICD-10-CM | POA: Diagnosis present

## 2022-01-13 DIAGNOSIS — F411 Generalized anxiety disorder: Secondary | ICD-10-CM | POA: Diagnosis not present

## 2022-01-13 HISTORY — DX: Suicidal ideations: R45.851

## 2022-01-13 LAB — COMPREHENSIVE METABOLIC PANEL
ALT: 16 U/L (ref 0–44)
AST: 18 U/L (ref 15–41)
Albumin: 4.5 g/dL (ref 3.5–5.0)
Alkaline Phosphatase: 52 U/L (ref 38–126)
Anion gap: 5 (ref 5–15)
BUN: 13 mg/dL (ref 6–20)
CO2: 24 mmol/L (ref 22–32)
Calcium: 9.1 mg/dL (ref 8.9–10.3)
Chloride: 110 mmol/L (ref 98–111)
Creatinine, Ser: 0.66 mg/dL (ref 0.44–1.00)
GFR, Estimated: >60 mL/min (ref >60)
Glucose, Bld: 90 mg/dL (ref 70–99)
Potassium: 3.6 mmol/L (ref 3.5–5.1)
Sodium: 139 mmol/L (ref 135–145)
Total Bilirubin: 1.6 mg/dL — ABNORMAL HIGH (ref 0.3–1.2)
Total Protein: 7 g/dL (ref 6.5–8.1)

## 2022-01-13 LAB — RAPID URINE DRUG SCREEN, HOSP PERFORMED
Amphetamines: POSITIVE — AB
Barbiturates: NOT DETECTED
Benzodiazepines: NOT DETECTED
Cocaine: NOT DETECTED
Opiates: NOT DETECTED
Tetrahydrocannabinol: POSITIVE — AB

## 2022-01-13 LAB — CBC WITH DIFFERENTIAL/PLATELET
Abs Immature Granulocytes: 0.04 10*3/uL (ref 0.00–0.07)
Basophils Absolute: 0 10*3/uL (ref 0.0–0.1)
Basophils Relative: 0 %
Eosinophils Absolute: 0.1 10*3/uL (ref 0.0–0.5)
Eosinophils Relative: 1 %
HCT: 39 % (ref 36.0–46.0)
Hemoglobin: 13.3 g/dL (ref 12.0–15.0)
Immature Granulocytes: 1 %
Lymphocytes Relative: 22 %
Lymphs Abs: 1.9 10*3/uL (ref 0.7–4.0)
MCH: 28.4 pg (ref 26.0–34.0)
MCHC: 34.1 g/dL (ref 30.0–36.0)
MCV: 83.2 fL (ref 80.0–100.0)
Monocytes Absolute: 0.7 10*3/uL (ref 0.1–1.0)
Monocytes Relative: 8 %
Neutro Abs: 6 10*3/uL (ref 1.7–7.7)
Neutrophils Relative %: 68 %
Platelets: 214 10*3/uL (ref 150–400)
RBC: 4.69 MIL/uL (ref 3.87–5.11)
RDW: 12.9 % (ref 11.5–15.5)
WBC: 8.8 10*3/uL (ref 4.0–10.5)
nRBC: 0 % (ref 0.0–0.2)

## 2022-01-13 LAB — URINALYSIS, ROUTINE W REFLEX MICROSCOPIC
Bacteria, UA: NONE SEEN
Bilirubin Urine: NEGATIVE
Glucose, UA: NEGATIVE mg/dL
Hgb urine dipstick: NEGATIVE
Ketones, ur: 80 mg/dL — AB
Leukocytes,Ua: NEGATIVE
Nitrite: NEGATIVE
Protein, ur: 30 mg/dL — AB
Specific Gravity, Urine: 1.034 — ABNORMAL HIGH (ref 1.005–1.030)
pH: 5 (ref 5.0–8.0)

## 2022-01-13 LAB — ACETAMINOPHEN LEVEL: Acetaminophen (Tylenol), Serum: 17 ug/mL (ref 10–30)

## 2022-01-13 LAB — PREGNANCY, URINE: Preg Test, Ur: NEGATIVE

## 2022-01-13 LAB — MAGNESIUM: Magnesium: 2 mg/dL (ref 1.7–2.4)

## 2022-01-13 LAB — SALICYLATE LEVEL: Salicylate Lvl: <7 mg/dL — ABNORMAL LOW (ref 7.0–30.0)

## 2022-01-13 LAB — RESP PANEL BY RT-PCR (FLU A&B, COVID) ARPGX2
Influenza A by PCR: NEGATIVE
Influenza B by PCR: NEGATIVE
SARS Coronavirus 2 by RT PCR: NEGATIVE

## 2022-01-13 LAB — HCG, SERUM, QUALITATIVE: Preg, Serum: NEGATIVE

## 2022-01-13 LAB — ETHANOL: Alcohol, Ethyl (B): <10 mg/dL (ref <10)

## 2022-01-13 MED ORDER — ONDANSETRON HCL 4 MG PO TABS: 4.0000 mg | ORAL_TABLET | Freq: Four times a day (QID) | ORAL | Status: DC | PRN

## 2022-01-13 MED ORDER — ENOXAPARIN SODIUM 40 MG/0.4ML IJ SOSY
40.0000 mg | PREFILLED_SYRINGE | INTRAMUSCULAR | Status: DC
  Administered 2022-01-13 – 2022-01-14 (×2): 40 mg via SUBCUTANEOUS
  Filled 2022-01-13 (×2): qty 0.4

## 2022-01-13 MED ORDER — ONDANSETRON HCL 4 MG/2ML IJ SOLN
4.0000 mg | Freq: Four times a day (QID) | INTRAMUSCULAR | Status: DC | PRN
  Administered 2022-01-14: 4 mg via INTRAVENOUS
  Filled 2022-01-13: qty 2

## 2022-01-13 NOTE — ED Notes (Signed)
[facility] [phone] consulted, and recommends: 24 hour obs (2/2 lexapro and tessalon) Tessalon concerning for cardiac arrest EKG now and in the morning Desire Mag and potassium at the high end of normal 4 hr acetaminophen level Routine protocol Will follow back up

## 2022-01-13 NOTE — ED Triage Notes (Signed)
Pt brought in by [facility] for c/o SI and overdose; pt reports she took 6 nyquil tablets, 15 20mg  lexapro and 6 tessalon pearls at 3pm; pt started to feel nauseous and called 911; pt admits to feeling sleepy at this time;  When asked why pt tried to harm self she states she doesn't want to talk about it but it is related to family

## 2022-01-13 NOTE — H&P (Addendum)
History and Physical    Patient: Unknown Kristie Crawford PIR:[id number] DOB: 11-Oct-2000 DOA: 01/13/2022 DOS: the patient was seen and examined on 01/13/2022 PCP: Nyoka CowdenMacDonald, Laurie, MD  Patient coming from: Home  Chief Complaint:  Chief Complaint  Patient presents with   V70.1   HPI: Unknown Kristie Irene Cartaya is a 21 y.o. female with medical history significant of PTSD.  Patient seen for overdose.  She reports that she took a NyQuil tablets, 15 tablets of Lexapro 20 mg, and Tessalon 6 tablets.  She took them around 3 PM.  She fell asleep briefly, then awoke and felt nauseated.  She called EMS who brought her to the hospital for evaluation.  Currently, she still feels sleepy.  She does have some abdominal pain and nausea when she stands.  The symptoms are improved with laying down.  Review of Systems: As mentioned in the history of present illness. All other systems reviewed and are negative. Past Medical History:  Diagnosis Date   Anxiety and depression    Ovarian cyst    Pregnancy induced hypertension    PTSD (post-traumatic stress disorder)    Recurrent tonsillitis 09/2017   current antibiotic, will finish 09/25/2017   Seizure-like activity [facility](HCC)    Past Surgical History:  Procedure Laterality Date   CESAREAN SECTION N/A 03/31/2019   Procedure: CESAREAN SECTION;  Surgeon: Levi AlandAnderson, Mark E, MD;  [facility]: [facility];  Service: Obstetrics;  Laterality: N/A;   TONSILLECTOMY N/A 10/01/2017   Procedure: TONSILLECTOMY;  Surgeon: Serena Colonelosen, Jefry, MD;  Location: [facility];  Service: ENT;  Laterality: N/A;   TONSILLECTOMY     WISDOM TOOTH EXTRACTION  10/2014   Social History:  reports that she has never smoked. She has never used smokeless tobacco. She reports that she does not drink alcohol and does not use drugs.  Allergies  Allergen Reactions   Dexamethasone Swelling   Cariprazine Other (See Comments)    Seizure Seizure    Ciprofloxacin Rash    Family History  Problem Relation  Age of Onset   Ovarian cancer Mother    Skin cancer Mother        unsure of type - not melanoma   Cancer Maternal Aunt    Cancer Maternal Uncle    Cancer Maternal Grandmother    Stroke Maternal Grandmother    Diabetes Maternal Grandmother    Healthy Father     Prior to Admission medications   Medication Sig Start Date End Date Taking? Authorizing Provider  cyclobenzaprine (FLEXERIL) 10 MG tablet Take 1 tablet (10 mg total) by mouth 3 (three) times daily as needed for muscle spasms. 11/21/21   Molpus, John, MD  prazosin (MINIPRESS) 1 MG capsule Take 1 mg by mouth at bedtime. 08/23/19   [provider]  VRAYLAR capsule Take 1.5 mg by mouth daily. 09/18/19   [provider]    Physical Exam: Vitals:   01/13/22 1730 01/13/22 1745 01/13/22 1900 01/13/22 2000  BP: 126/84  130/90 100/89  Pulse: 81 80 75 81  Resp: 16 15 14 17   Temp:      TempSrc:      SpO2: 97% 97% 96% 97%  Weight:      Height:       General: young female. Awake and alert and oriented x3. No acute cardiopulmonary distress.  HEENT: Normocephalic atraumatic.  Right and left ears normal in appearance.  Pupils equal, round, reactive to light. Extraocular muscles are intact. Sclerae anicteric and noninjected.  Moist mucosal  membranes. No mucosal lesions.  Neck: Neck supple without lymphadenopathy. No carotid bruits. No masses palpated.  Cardiovascular: Regular rate with normal S1-S2 sounds. No murmurs, rubs, gallops auscultated. No JVD.  Respiratory: Good respiratory effort with no wheezes, rales, rhonchi. Lungs clear to auscultation bilaterally.  No accessory muscle use. Abdomen: Soft, nontender, nondistended. Active bowel sounds. No masses or hepatosplenomegaly  Skin: No rashes, lesions, or ulcerations.  Dry, warm to touch. 2+ dorsalis pedis and radial pulses. Musculoskeletal: No calf or leg pain. All major joints not erythematous nontender.  No upper or lower joint deformation.  Good ROM.  No contractures   Psychiatric: Intact judgment and insight. Pleasant and cooperative. Neurologic: No focal neurological deficits. Strength is 5/5 and symmetric in upper and lower extremities.  Cranial nerves II through XII are grossly intact.  Data Reviewed: Results for orders placed or performed during the hospital encounter of 01/13/22 (from the past 24 hour(s))  Resp Panel by RT-PCR (Flu A&B, Covid) Anterior Nasal Swab     Status: None   Collection Time: 01/13/22  5:19 PM   Specimen: Anterior Nasal Swab  Result Value Ref Range   SARS Coronavirus 2 by RT PCR NEGATIVE NEGATIVE   Influenza A by PCR NEGATIVE NEGATIVE   Influenza B by PCR NEGATIVE NEGATIVE  Comprehensive metabolic panel     Status: Abnormal   Collection Time: 01/13/22  5:35 PM  Result Value Ref Range   Sodium 139 135 - 145 mmol/L   Potassium 3.6 3.5 - 5.1 mmol/L   Chloride 110 98 - 111 mmol/L   CO2 24 22 - 32 mmol/L   Glucose, Bld 90 70 - 99 mg/dL   BUN 13 6 - 20 mg/dL   Creatinine, Ser 6.28 0.44 - 1.00 mg/dL   Calcium 9.1 8.9 - 31.5 mg/dL   Total Protein 7.0 6.5 - 8.1 g/dL   Albumin 4.5 3.5 - 5.0 g/dL   AST 18 15 - 41 U/L   ALT 16 0 - 44 U/L   Alkaline Phosphatase 52 38 - 126 U/L   Total Bilirubin 1.6 (H) 0.3 - 1.2 mg/dL   GFR, Estimated >17 >61 mL/min   Anion gap 5 5 - 15  Ethanol     Status: None   Collection Time: 01/13/22  5:35 PM  Result Value Ref Range   Alcohol, Ethyl (B) <10 <10 mg/dL  Magnesium     Status: None   Collection Time: 01/13/22  5:35 PM  Result Value Ref Range   Magnesium 2.0 1.7 - 2.4 mg/dL  CBC with Differential     Status: None   Collection Time: 01/13/22  5:35 PM  Result Value Ref Range   WBC 8.8 4.0 - 10.5 K/uL   RBC 4.69 3.87 - 5.11 MIL/uL   Hemoglobin 13.3 12.0 - 15.0 g/dL   HCT 60.7 37.1 - 06.2 %   MCV 83.2 80.0 - 100.0 fL   MCH 28.4 26.0 - 34.0 pg   MCHC 34.1 30.0 - 36.0 g/dL   RDW 69.4 85.4 - 62.7 %   Platelets 214 150 - 400 K/uL   nRBC 0.0 0.0 - 0.2 %   Neutrophils Relative % 68 %    Neutro Abs 6.0 1.7 - 7.7 K/uL   Lymphocytes Relative 22 %   Lymphs Abs 1.9 0.7 - 4.0 K/uL   Monocytes Relative 8 %   Monocytes Absolute 0.7 0.1 - 1.0 K/uL   Eosinophils Relative 1 %   Eosinophils Absolute 0.1 0.0 - 0.5 K/uL  Basophils Relative 0 %   Basophils Absolute 0.0 0.0 - 0.1 K/uL   Immature Granulocytes 1 %   Abs Immature Granulocytes 0.04 0.00 - 0.07 K/uL  Acetaminophen level     Status: None   Collection Time: 01/13/22  5:35 PM  Result Value Ref Range   Acetaminophen (Tylenol), Serum 17 10 - 30 ug/mL  Salicylate level     Status: Abnormal   Collection Time: 01/13/22  5:35 PM  Result Value Ref Range   Salicylate Lvl <7.0 (L) 7.0 - 30.0 mg/dL  hCG, serum, qualitative     Status: None   Collection Time: 01/13/22  5:35 PM  Result Value Ref Range   Preg, Serum NEGATIVE NEGATIVE   No results found.   Assessment and Plan: No notes have been filed under this hospital service. Service: Hospitalist  Principal Problem:   Overdose Active Problems:   Suicide ideation  Overdose with suicide ideation Admit to on telemetry monitoring Poison control recommended EKGs every 6 hours to watch for prolongation of QT.  Currently, the patient is her of her on volition. I did inform the patient that because of the medications she took, we would have to keep her here against her will due to the suicide attempt. Patient acknowledged this. Recheck CBC, CMP tomorrow.   Advance Care Planning:   Code Status: Prior full code  Consults: psych when medically cleared  Family Communication:   Severity of Illness: The appropriate patient status for this patient is INPATIENT. Inpatient status is judged to be reasonable and necessary in order to provide the required intensity of service to ensure the patient's safety. The patient's presenting symptoms, physical exam findings, and initial radiographic and laboratory data in the context of their chronic comorbidities is felt to place them at high  risk for further clinical deterioration. Furthermore, it is not anticipated that the patient will be medically stable for discharge from the hospital within 2 midnights of admission.   * I certify that at the point of admission it is my clinical judgment that the patient will require inpatient hospital care spanning beyond 2 midnights from the point of admission due to high intensity of service, high risk for further deterioration and high frequency of surveillance required.*  Author: Levie Heritage, DO 01/13/2022 8:47 PM  For on call review [email or web address].

## 2022-01-13 NOTE — ED Notes (Signed)
Pt awake, drowsy, interactive, soft spoken, nasally congested, tearful. Sister at [facility]. Sitter present. VSS.

## 2022-01-13 NOTE — ED Notes (Signed)
EDP into room, at [facility] during triage process.

## 2022-01-13 NOTE — ED Provider Notes (Signed)
  [place] Provider Note   CSN: [id number] Arrival date & time: 01/13/22  1705     History {Add pertinent medical, surgical, social history, OB history to HPI:1} Chief Complaint  Patient presents with   V70.1    Kristie Crawford is a 21 y.o. female.  HPI Patient presents after overdose.  Reportedly suicide attempt although patient will not really provide much history for it.  Reportedly took 6 NyQuil tablets, 15 tablets of 20 mg Lexapro.  Also 6 tablets of Tessalon.  Took them at 3:00.  Called EMS because she became nauseous.    Home Medications Prior to Admission medications   Medication Sig Start Date End Date Taking? Authorizing Provider  cyclobenzaprine (FLEXERIL) 10 MG tablet Take 1 tablet (10 mg total) by mouth 3 (three) times daily as needed for muscle spasms. 11/21/21   Molpus, John, MD  prazosin (MINIPRESS) 1 MG capsule Take 1 mg by mouth at bedtime. 08/23/19   [provider]  VRAYLAR capsule Take 1.5 mg by mouth daily. 09/18/19   [provider]      Allergies    Dexamethasone, Cariprazine, and Ciprofloxacin    Review of Systems   Review of Systems  Physical Exam Updated Vital Signs Ht 5\' 2"  (1.575 m)   Wt 61.2 kg   BMI 24.68 kg/m  Physical Exam Vitals and nursing note reviewed.  Eyes:     Comments: Pupils dilated.  Pulmonary:     Effort: Pulmonary effort is normal.  Abdominal:     Tenderness: There is no abdominal tenderness.  Neurological:     Comments: Pupils dilated.  Somewhat slow to answer.  Unsure if it is behavioral or somnolent.    ED Results / Procedures / Treatments   Labs (all labs ordered are listed, but only abnormal results are displayed) Labs Reviewed  RESP PANEL BY RT-PCR (FLU A&B, COVID) ARPGX2  COMPREHENSIVE METABOLIC PANEL  ETHANOL  MAGNESIUM  CBC WITH DIFFERENTIAL/PLATELET  RAPID URINE DRUG SCREEN, HOSP PERFORMED  PREGNANCY, URINE  URINALYSIS, ROUTINE W REFLEX MICROSCOPIC     EKG None  Radiology No results found.  Procedures Procedures  {Document cardiac monitor, telemetry assessment procedure when appropriate:1}  Medications Ordered in ED Medications - No data to display  ED Course/ Medical Decision Making/ A&P                           Medical Decision Making Amount and/or Complexity of Data Reviewed Labs: ordered.   ***  {Document critical care time when appropriate:1} {Document review of labs and clinical decision tools ie heart score, Chads2Vasc2 etc:1}  {Document your independent review of radiology images, and any outside records:1} {Document your discussion with family members, caretakers, and with consultants:1} {Document social determinants of health affecting pt's care:1} {Document your decision making why or why not admission, treatments were needed:1} Final Clinical Impression(s) / ED Diagnoses Final diagnoses:  None    Rx / DC Orders ED Discharge Orders     None

## 2022-01-14 DIAGNOSIS — T50902A Poisoning by unspecified drugs, medicaments and biological substances, intentional self-harm, initial encounter: Secondary | ICD-10-CM

## 2022-01-14 LAB — CBC
HCT: 37 % (ref 36.0–46.0)
Hemoglobin: 12.6 g/dL (ref 12.0–15.0)
MCH: 28.6 pg (ref 26.0–34.0)
MCHC: 34.1 g/dL (ref 30.0–36.0)
MCV: 83.9 fL (ref 80.0–100.0)
Platelets: 206 10*3/uL (ref 150–400)
RBC: 4.41 MIL/uL (ref 3.87–5.11)
RDW: 12.8 % (ref 11.5–15.5)
WBC: 8.7 10*3/uL (ref 4.0–10.5)
nRBC: 0 % (ref 0.0–0.2)

## 2022-01-14 LAB — BASIC METABOLIC PANEL
Anion gap: 7 (ref 5–15)
BUN: 10 mg/dL (ref 6–20)
CO2: 23 mmol/L (ref 22–32)
Calcium: 8.9 mg/dL (ref 8.9–10.3)
Chloride: 110 mmol/L (ref 98–111)
Creatinine, Ser: 0.6 mg/dL (ref 0.44–1.00)
GFR, Estimated: >60 mL/min (ref >60)
Glucose, Bld: 84 mg/dL (ref 70–99)
Potassium: 3.7 mmol/L (ref 3.5–5.1)
Sodium: 140 mmol/L (ref 135–145)

## 2022-01-14 LAB — HIV ANTIBODY (ROUTINE TESTING W REFLEX): HIV Screen 4th Generation wRfx: NONREACTIVE

## 2022-01-14 NOTE — Progress Notes (Signed)
All Q6 EKG's have been completed for 24 hrs.

## 2022-01-14 NOTE — Progress Notes (Signed)
PROGRESS NOTE    Kristie Crawford  [id number] DOB: [birth year] DOA: 01/13/2022 PCP: Nyoka Cowden, MD   Brief Narrative:     Kristie Crawford is a 21 y.o. female with medical history significant of PTSD.  Patient seen for overdose.  She reports that she took a NyQuil tablets, 15 tablets of Lexapro 20 mg, and Tessalon 6 tablets.  Patient states that she has been having increased amounts of anxiety with her PTSD and she is tired of this and "wanted it to end."  Assessment & Plan:   Principal Problem:   Overdose Active Problems:   Suicide ideation  Assessment and Plan:   Overdose with suicide ideation Admit to on telemetry monitoring Poison control recommended EKGs every 6 hours to watch for prolongation of QT.  Recheck EKG. Currently, the patient is her of her on volition TTS evaluation pending and patient is agreeable    DVT prophylaxis: Lovenox Code Status: Full Family Communication: None at bedside Disposition Plan:  Status is: Inpatient Remains inpatient appropriate because: Unsafe discharge disposition requiring psychiatry evaluation.   Consultants:  TTS  Procedures:  None  Antimicrobials:  None   Subjective: Patient seen and evaluated today with no new acute complaints or concerns. No acute concerns or events noted overnight.  She states that she overdosed on medications because she is tired of her constant struggle with anxiety.  Objective: Vitals:   01/13/22 2100 01/13/22 2132 01/13/22 2222 01/14/22 0454  BP: 119/84  (!) 101/52 110/69  Pulse: (!) 102  77 73  Resp: (!) 22  18 20   Temp: 98.4 F (36.9 C)   98.1 F (36.7 C)  TempSrc: Oral   Oral  SpO2: 99% 99% 97% 98%  Weight:   78.3 kg   Height:   5\' 2"  (1.575 m)    No intake or output data in the 24 hours ending 01/14/22 1247 Filed Weights   01/13/22 1717 01/13/22 2222  Weight: 61.2 kg 78.3 kg    Examination:  General exam: Appears calm and comfortable  Respiratory system: Clear  to auscultation. Respiratory effort normal. Cardiovascular system: S1 & S2 heard, RRR.  Gastrointestinal system: Abdomen is soft Central nervous system: Alert and awake Extremities: No edema Skin: No significant lesions noted Psychiatry: Flat affect.    Data Reviewed: I have personally reviewed following labs and imaging studies  CBC: Recent Labs  Lab 01/13/22 1735 01/14/22 0552  WBC 8.8 8.7  NEUTROABS 6.0  --   HGB 13.3 12.6  HCT 39.0 37.0  MCV 83.2 83.9  PLT 214 206   Basic Metabolic Panel: Recent Labs  Lab 01/13/22 1735 01/14/22 0552  NA 139 140  K 3.6 3.7  CL 110 110  CO2 24 23  GLUCOSE 90 84  BUN 13 10  CREATININE 0.66 0.60  CALCIUM 9.1 8.9  MG 2.0  --    GFR: Estimated Creatinine Clearance: 106.9 mL/min (by C-G formula based on SCr of 0.6 mg/dL). Liver Function Tests: Recent Labs  Lab 01/13/22 1735  AST 18  ALT 16  ALKPHOS 52  BILITOT 1.6*  PROT 7.0  ALBUMIN 4.5   No results for input(s): LIPASE, AMYLASE in the last 168 hours. No results for input(s): AMMONIA in the last 168 hours. Coagulation Profile: No results for input(s): INR, PROTIME in the last 168 hours. Cardiac Enzymes: No results for input(s): CKTOTAL, CKMB, CKMBINDEX, TROPONINI in the last 168 hours. BNP (last 3 results) No results for input(s): PROBNP in the last 8760 hours.  HbA1C: No results for input(s): HGBA1C in the last 72 hours. CBG: No results for input(s): GLUCAP in the last 168 hours. Lipid Profile: No results for input(s): CHOL, HDL, LDLCALC, TRIG, CHOLHDL, LDLDIRECT in the last 72 hours. Thyroid Function Tests: No results for input(s): TSH, T4TOTAL, FREET4, T3FREE, THYROIDAB in the last 72 hours. Anemia Panel: No results for input(s): VITAMINB12, FOLATE, FERRITIN, TIBC, IRON, RETICCTPCT in the last 72 hours. Sepsis Labs: No results for input(s): PROCALCITON, LATICACIDVEN in the last 168 hours.  Recent Results (from the past 240 hour(s))  Resp Panel by RT-PCR (Flu  A&B, Covid) Anterior Nasal Swab     Status: None   Collection Time: 01/13/22  5:19 PM   Specimen: Anterior Nasal Swab  Result Value Ref Range Status   SARS Coronavirus 2 by RT PCR NEGATIVE NEGATIVE Final    Comment: (NOTE) SARS-CoV-2 target nucleic acids are NOT DETECTED.  The SARS-CoV-2 RNA is generally detectable in upper respiratory specimens during the acute phase of infection. The lowest concentration of SARS-CoV-2 viral copies this assay can detect is 138 copies/mL. A negative result does not preclude SARS-Cov-2 infection and should not be used as the sole basis for treatment or other patient management decisions. A negative result may occur with  improper specimen collection/handling, submission of specimen other than nasopharyngeal swab, presence of viral mutation(s) within the areas targeted by this assay, and inadequate number of viral copies(<138 copies/mL). A negative result must be combined with clinical observations, patient history, and epidemiological information. The expected result is Negative.  Fact Sheet for Patients:  BloggerCourse.com  Fact Sheet for Healthcare Providers:  SeriousBroker.it  This test is no t yet approved or cleared by the [place] FDA and  has been authorized for detection and/or diagnosis of SARS-CoV-2 by FDA under an Emergency Use Authorization (EUA). This EUA will remain  in effect (meaning this test can be used) for the duration of the COVID-19 declaration under Section 564(b)(1) of the Act, 21 U.S.C.section 360bbb-3(b)(1), unless the authorization is terminated  or revoked sooner.       Influenza A by PCR NEGATIVE NEGATIVE Final   Influenza B by PCR NEGATIVE NEGATIVE Final    Comment: (NOTE) The Xpert Xpress SARS-CoV-2/FLU/RSV plus assay is intended as an aid in the diagnosis of influenza from Nasopharyngeal swab specimens and should not be used as a sole basis for treatment.  Nasal washings and aspirates are unacceptable for Xpert Xpress SARS-CoV-2/FLU/RSV testing.  Fact Sheet for Patients: BloggerCourse.com  Fact Sheet for Healthcare Providers: SeriousBroker.it  This test is not yet approved or cleared by the [place] FDA and has been authorized for detection and/or diagnosis of SARS-CoV-2 by FDA under an Emergency Use Authorization (EUA). This EUA will remain in effect (meaning this test can be used) for the duration of the COVID-19 declaration under Section 564(b)(1) of the Act, 21 U.S.C. section 360bbb-3(b)(1), unless the authorization is terminated or revoked.  Performed at [facility], [address], [place], [place] [address]          Radiology Studies: No results found.      Scheduled Meds:  enoxaparin (LOVENOX) injection  40 mg Subcutaneous Q24H     LOS: 1 day    Time spent: 35 minutes    Keyetta Hollingworth Hoover Brunette, DO [facility]  If 7PM-7AM, please contact night-coverage [email or web address] 01/14/2022, 12:47 PM
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
## 2022-01-14 NOTE — Progress Notes (Signed)
Patient is currently medically stable for psychiatric evaluation.

## 2022-01-15 DIAGNOSIS — Z20822 Contact with and (suspected) exposure to covid-19: Secondary | ICD-10-CM | POA: Diagnosis not present

## 2022-01-15 DIAGNOSIS — F410 Panic disorder [episodic paroxysmal anxiety] without agoraphobia: Secondary | ICD-10-CM | POA: Diagnosis not present

## 2022-01-15 DIAGNOSIS — Z9151 Personal history of suicidal behavior: Secondary | ICD-10-CM | POA: Diagnosis not present

## 2022-01-15 DIAGNOSIS — F29 Unspecified psychosis not due to a substance or known physiological condition: Secondary | ICD-10-CM | POA: Diagnosis not present

## 2022-01-15 DIAGNOSIS — F411 Generalized anxiety disorder: Secondary | ICD-10-CM | POA: Diagnosis not present

## 2022-01-15 DIAGNOSIS — F339 Major depressive disorder, recurrent, unspecified: Secondary | ICD-10-CM | POA: Diagnosis not present

## 2022-01-15 NOTE — Progress Notes (Signed)
CSW received phone call from [facility] requesting RN/ provider notes. CSW sent requested information. CSW advised that pt is under review. First shift CSW to assist and follow up with this provider on referral.   Maryjean Ka, MSW, [facility] 01/15/2022 4:14 AM

## 2022-01-15 NOTE — Progress Notes (Signed)
This CSW received a phone call from [facility] advising that pt is under review. First shift to follow.  Benjaman Kindler, MSW, [facility] 01/15/2022 7:34 AM

## 2022-01-15 NOTE — Progress Notes (Signed)
Inpatient Behavioral Health  Pt meets inpatient criteria per Roselyn Bering, NP. There are no available beds at [facility] per [facility] Fransico Michael, RN.  Referral was sent to the following facilities;   Destination Service Provider Address Phone Fax  [facility]  [address] [place] [address] [phone]  [facility]  [address] [place] [address] [phone]  [facility]  [address] [place], [place] [address] [phone]  [facility]  [address] [place] [address] [phone]  [facility]  [address] [place], [place] [address] [phone]  [facility]  [address] [place] [address] [phone]  [facility]  [address] [place] [address] [phone]  [facility]  [address] [place] [address] [phone]  [facility]  [address], [place] [address] [phone]  [facility]  [address], [place] [address] [phone]  [facility]  [address], [place] [address] [phone]  [facility]  [address] [place] [address] [phone]  [facility]  [address] [place] [address] [phone]  [facility]  [address] [place] [address] [phone]  [facility]  [address], [place] [address] [phone]  [facility]  [address], [place] [address] [phone]    Situation ongoing,  CSW will follow up.   Maryjean Ka, MSW,  LCSWA 01/15/2022  @ 12:09 AM

## 2022-01-15 NOTE — Discharge Summary (Signed)
Physician Discharge Summary  Kristie Crawford WUJ:[id number] DOB: [birth year] DOA: 01/13/2022  PCP: Nyoka Cowden, MD  Admit date: 01/13/2022  Discharge date: 01/15/2022  Admitted From:Home  Disposition: Behavioral health facility  Recommendations for Outpatient Follow-up:  Follow up with PCP in 1-2 weeks Follow-up at behavioral health facility for further recommendations and continue on medications as noted below  Home Health: None  Equipment/Devices: None  Discharge Condition:Stable  CODE STATUS: Full  Diet recommendation: Heart Healthy  Brief/Interim Summary:  Kristie Crawford is a 21 y.o. female with medical history significant of PTSD.  Patient seen for overdose.  She reports that she took a NyQuil tablets, 15 tablets of Lexapro 20 mg, and Tessalon 6 tablets.  Patient states that she has been having increased amounts of anxiety with her PTSD and she is tired of this and "wanted it to end."  She has done well throughout the course of this hospitalization with no acute events or issues noted.  She was seen by psychiatry with recommendations for behavioral health placement and this has been arranged today on day of discharge.  Discharge Diagnoses:  Principal Problem:   Overdose Active Problems:   Suicide ideation  Principal discharge diagnosis: Intentional overdose in the setting of suicidal ideation.  PTSD.  Discharge Instructions  Discharge Instructions     Diet - low sodium heart healthy   Complete by: As directed    Increase activity slowly   Complete by: As directed       Allergies as of 01/15/2022       Reactions   Dexamethasone Swelling   Cariprazine Other (See Comments)   Seizure Seizure   Ciprofloxacin Rash        Medication List     STOP taking these medications    cyclobenzaprine 10 MG tablet Commonly known as: FLEXERIL       TAKE these medications    prazosin 1 MG capsule Commonly known as: MINIPRESS Take 1 mg by mouth at  bedtime.   Vraylar 1.5 MG capsule Generic drug: cariprazine Take 1.5 mg by mouth daily.        Follow-up Information     Nyoka Cowden, MD. Schedule an appointment as soon as possible for a visit in 1 week(s).   Specialty: Pediatrics Contact information: [phone] [place] [address] [phone]                Allergies  Allergen Reactions   Dexamethasone Swelling   Cariprazine Other (See Comments)    Seizure Seizure    Ciprofloxacin Rash    Consultations: Psychiatry   Procedures/Studies: No results found.   Discharge Exam: Vitals:   01/14/22 2109 01/15/22 0500  BP: 108/62 110/68  Pulse: 64 69  Resp: 18 20  Temp: 98.8 F (37.1 C) 98 F (36.7 C)  SpO2: 98% 98%   Vitals:   01/14/22 0454 01/14/22 1600 01/14/22 2109 01/15/22 0500  BP: 110/69 117/71 108/62 110/68  Pulse: 73 72 64 69  Resp: Temp: 98.1 F (36.7 C) 98.2 F (36.8 C) 98.8 F (37.1 C) 98 F (36.7 C)  TempSrc: Oral Oral Oral Oral  SpO2: 98% 97% 98% 98%  Weight:      Height:        General: Pt is alert, awake, not in acute distress Cardiovascular: RRR, S1/S2 +, no rubs, no gallops Respiratory: CTA bilaterally, no wheezing, no rhonchi Abdominal: Soft, NT, ND, bowel sounds + Extremities: no  edema, no cyanosis    The results of significant diagnostics from this hospitalization (including imaging, microbiology, ancillary and laboratory) are listed below for reference.     Microbiology: Recent Results (from the past 240 hour(s))  Resp Panel by RT-PCR (Flu A&B, Covid) Anterior Nasal Swab     Status: None   Collection Time: 01/13/22  5:19 PM   Specimen: Anterior Nasal Swab  Result Value Ref Range Status   SARS Coronavirus 2 by RT PCR NEGATIVE NEGATIVE Final    Comment: (NOTE) SARS-CoV-2 target nucleic acids are NOT DETECTED.  The SARS-CoV-2 RNA is generally detectable in upper respiratory specimens during the acute phase of infection. The  lowest concentration of SARS-CoV-2 viral copies this assay can detect is 138 copies/mL. A negative result does not preclude SARS-Cov-2 infection and should not be used as the sole basis for treatment or other patient management decisions. A negative result may occur with  improper specimen collection/handling, submission of specimen other than nasopharyngeal swab, presence of viral mutation(s) within the areas targeted by this assay, and inadequate number of viral copies(<138 copies/mL). A negative result must be combined with clinical observations, patient history, and epidemiological information. The expected result is Negative.  Fact Sheet for Patients:  BloggerCourse.com  Fact Sheet for Healthcare Providers:  SeriousBroker.it  This test is no t yet approved or cleared by the [place] FDA and  has been authorized for detection and/or diagnosis of SARS-CoV-2 by FDA under an Emergency Use Authorization (EUA). This EUA will remain  in effect (meaning this test can be used) for the duration of the COVID-19 declaration under Section 564(b)(1) of the Act, 21 U.S.C.section 360bbb-3(b)(1), unless the authorization is terminated  or revoked sooner.       Influenza A by PCR NEGATIVE NEGATIVE Final   Influenza B by PCR NEGATIVE NEGATIVE Final    Comment: (NOTE) The Xpert Xpress SARS-CoV-2/FLU/RSV plus assay is intended as an aid in the diagnosis of influenza from Nasopharyngeal swab specimens and should not be used as a sole basis for treatment. Nasal washings and aspirates are unacceptable for Xpert Xpress SARS-CoV-2/FLU/RSV testing.  Fact Sheet for Patients: BloggerCourse.com  Fact Sheet for Healthcare Providers: SeriousBroker.it  This test is not yet approved or cleared by the [place] FDA and has been authorized for detection and/or diagnosis of SARS-CoV-2 by FDA under  an Emergency Use Authorization (EUA). This EUA will remain in effect (meaning this test can be used) for the duration of the COVID-19 declaration under Section 564(b)(1) of the Act, 21 U.S.C. section 360bbb-3(b)(1), unless the authorization is terminated or revoked.  Performed at [facility], [address], [place], [place] [address]      Labs: BNP (last 3 results) No results for input(s): BNP in the last 8760 hours. Basic Metabolic Panel: Recent Labs  Lab 01/13/22 1735 01/14/22 0552  NA 139 140  K 3.6 3.7  CL 110 110  CO2 24 23  GLUCOSE 90 84  BUN 13 10  CREATININE 0.66 0.60  CALCIUM 9.1 8.9  MG 2.0  --    Liver Function Tests: Recent Labs  Lab 01/13/22 1735  AST 18  ALT 16  ALKPHOS 52  BILITOT 1.6*  PROT 7.0  ALBUMIN 4.5   No results for input(s): LIPASE, AMYLASE in the last 168 hours. No results for input(s): AMMONIA in the last 168 hours. CBC: Recent Labs  Lab 01/13/22 1735 01/14/22 0552  WBC 8.8 8.7  NEUTROABS 6.0  --   HGB 13.3  12.6  HCT 39.0 37.0  MCV 83.2 83.9  PLT 214 206   Cardiac Enzymes: No results for input(s): CKTOTAL, CKMB, CKMBINDEX, TROPONINI in the last 168 hours. BNP: Invalid input(s): POCBNP CBG: No results for input(s): GLUCAP in the last 168 hours. D-Dimer No results for input(s): DDIMER in the last 72 hours. Hgb A1c No results for input(s): HGBA1C in the last 72 hours. Lipid Profile No results for input(s): CHOL, HDL, LDLCALC, TRIG, CHOLHDL, LDLDIRECT in the last 72 hours. Thyroid function studies No results for input(s): TSH, T4TOTAL, T3FREE, THYROIDAB in the last 72 hours.  Invalid input(s): FREET3 Anemia work up No results for input(s): VITAMINB12, FOLATE, FERRITIN, TIBC, IRON, RETICCTPCT in the last 72 hours. Urinalysis    Component Value Date/Time   COLORURINE YELLOW 01/13/2022 1719   APPEARANCEUR HAZY (A) 01/13/2022 1719   LABSPEC 1.034 (H) 01/13/2022 1719   PHURINE 5.0 01/13/2022 1719   GLUCOSEU NEGATIVE  01/13/2022 1719   HGBUR NEGATIVE 01/13/2022 1719   BILIRUBINUR NEGATIVE 01/13/2022 1719   KETONESUR 80 (A) 01/13/2022 1719   PROTEINUR 30 (A) 01/13/2022 1719   NITRITE NEGATIVE 01/13/2022 1719   LEUKOCYTESUR NEGATIVE 01/13/2022 1719   Sepsis Labs Invalid input(s): PROCALCITONIN,  WBC,  LACTICIDVEN Microbiology Recent Results (from the past 240 hour(s))  Resp Panel by RT-PCR (Flu A&B, Covid) Anterior Nasal Swab     Status: None   Collection Time: 01/13/22  5:19 PM   Specimen: Anterior Nasal Swab  Result Value Ref Range Status   SARS Coronavirus 2 by RT PCR NEGATIVE NEGATIVE Final    Comment: (NOTE) SARS-CoV-2 target nucleic acids are NOT DETECTED.  The SARS-CoV-2 RNA is generally detectable in upper respiratory specimens during the acute phase of infection. The lowest concentration of SARS-CoV-2 viral copies this assay can detect is 138 copies/mL. A negative result does not preclude SARS-Cov-2 infection and should not be used as the sole basis for treatment or other patient management decisions. A negative result may occur with  improper specimen collection/handling, submission of specimen other than nasopharyngeal swab, presence of viral mutation(s) within the areas targeted by this assay, and inadequate number of viral copies(<138 copies/mL). A negative result must be combined with clinical observations, patient history, and epidemiological information. The expected result is Negative.  Fact Sheet for Patients:  BloggerCourse.comhttps://www.fda.gov/media/152166/download  Fact Sheet for Healthcare Providers:  SeriousBroker.ithttps://www.fda.gov/media/152162/download  This test is no t yet approved or cleared by the [place] FDA and  has been authorized for detection and/or diagnosis of SARS-CoV-2 by FDA under an Emergency Use Authorization (EUA). This EUA will remain  in effect (meaning this test can be used) for the duration of the COVID-19 declaration under Section 564(b)(1) of the Act,  21 U.S.C.section 360bbb-3(b)(1), unless the authorization is terminated  or revoked sooner.       Influenza A by PCR NEGATIVE NEGATIVE Final   Influenza B by PCR NEGATIVE NEGATIVE Final    Comment: (NOTE) The Xpert Xpress SARS-CoV-2/FLU/RSV plus assay is intended as an aid in the diagnosis of influenza from Nasopharyngeal swab specimens and should not be used as a sole basis for treatment. Nasal washings and aspirates are unacceptable for Xpert Xpress SARS-CoV-2/FLU/RSV testing.  Fact Sheet for Patients: BloggerCourse.comhttps://www.fda.gov/media/152166/download  Fact Sheet for Healthcare Providers: SeriousBroker.ithttps://www.fda.gov/media/152162/download  This test is not yet approved or cleared by the [place] FDA and has been authorized for detection and/or diagnosis of SARS-CoV-2 by FDA under an Emergency Use Authorization (EUA). This EUA will remain in effect (meaning this test  can be used) for the duration of the COVID-19 declaration under Section 564(b)(1) of the Act, 21 U.S.C. section 360bbb-3(b)(1), unless the authorization is terminated or revoked.  Performed at [facility], [address] [place], [place] [address]      Time coordinating discharge: 35 minutes  SIGNED:   Erick Blinks, DO [facility] 01/15/2022, 10:31 AM  If 7PM-7AM, please contact night-coverage [email or web address]

## 2022-01-15 NOTE — Progress Notes (Signed)
Patient discharged to [facility] via [facility]

## 2022-01-15 NOTE — Progress Notes (Addendum)
Pt has been accepted to [facility] today 01/15/22 to the [facility] unit, bed is ready. Accepting provider is Dr. Guss Bunde. Phone number for report [phone]. If pt is IVC'd please fax paperwork to [phone].  Pt meets inpatient criteria per Roselyn Bering, NP.  Nursing notified: Tonny Bollman, RN.  Maryjean Ka, MSW, [facility] 01/15/2022 7:42 AM

## 2022-03-01 ENCOUNTER — Encounter: Payer: Self-pay | Admitting: Nurse Practitioner

## 2022-03-01 ENCOUNTER — Other Ambulatory Visit: Payer: Self-pay

## 2022-03-01 ENCOUNTER — Ambulatory Visit: Payer: Medicaid Other | Admitting: Nurse Practitioner

## 2022-03-01 VITALS — BP 114/75 | HR 88 | Temp 98.1°F | Ht 62.0 in | Wt 176.8 lb

## 2022-03-01 DIAGNOSIS — F339 Major depressive disorder, recurrent, unspecified: Secondary | ICD-10-CM | POA: Diagnosis not present

## 2022-03-01 DIAGNOSIS — F419 Anxiety disorder, unspecified: Secondary | ICD-10-CM | POA: Diagnosis not present

## 2022-03-01 HISTORY — DX: Major depressive disorder, recurrent, unspecified: F33.9

## 2022-03-01 HISTORY — DX: Anxiety disorder, unspecified: F41.9

## 2022-03-01 MED ORDER — PAROXETINE HCL 20 MG PO TABS: 20.0000 mg | ORAL_TABLET | Freq: Every day | ORAL | 3 refills | Status: DC

## 2022-03-01 NOTE — Progress Notes (Signed)
New Patient Note  RE: Kristie Crawford MRN: [id number] DOB: 2001/02/08 Date of Office Visit: 03/01/2022  Chief Complaint: New Patient (Initial Visit) [facility] ), Establish Care, Depression, and Anxiety (Was taking paroxetine HCL 20mg  but has been out 2 weeks and thinks medication was not working. Suicide attempt 01/13/22 and was hospitalized. Needs referral)  History of Present Illness: Depression: Patient complains of depression. She complains of depressed mood, difficulty concentrating, fatigue, feelings of worthlessness/guilt, and hopelessness. Onset was approximately a few years ago, gradually worsening since that time.  She denies current suicidal and homicidal plan or intent.   Family history significant for depression.Possible organic causes contributing are: none.  Risk factors: positive family history in  father and mother Previous treatment includes  vrayler  and individual therapy. She complains of the following side effects from the treatment: none.  Flowsheet Row Office Visit from 03/01/2022 in 03/03/2022 Family Medicine  PHQ-9 Total Score 24       Anxiety: Patient complains of anxiety disorder.  She has the following symptoms: chest pain, difficulty concentrating, dizziness. Onset of symptoms was approximately a few years ago, rapidly worsening since that time. She denies current suicidal and homicidal ideation. Family history significant for anxiety and depression.Possible organic causes contributing are: none. Risk factors: positive family history in  daughter and father Previous treatment includes  vrayler  and minipressindividual therapy.  She complains of the following side effects from the treatment: none.      03/01/2022    2:25 PM  GAD 7 : Generalized Anxiety Score  Nervous, Anxious, on Edge 3  Control/stop worrying 3  Worry too much - different things 3  Trouble relaxing 3  Restless 3  Easily annoyed or irritable 0  Afraid - awful might happen 3  Total  GAD 7 Score 18  Anxiety Difficulty Extremely difficult    Assessment and Plan: Kristie Crawford is a 21 y.o. female with: Anxiety Anxiety not well controlled.  Completed GAD-7.  Started patient on paroxetine 20 mg tablet by mouth daily.  Follow-up in 6 weeks.  Completed referral to psych.  Patient knows to follow-up with worsening unresolved symptoms.  Depression, recurrent (HCC) Patient is a 21 year old female who presents to clinic to establish care with complaints of depression.  Symptoms not well controlled.  Completed PHQ-9.  Started patient on paroxetine 20 mg tablet by mouth daily.  patient has a recent suicidal attempt 01/13/2022 and was hospitalized.  Patient was discharged after treatment and psych consultation.  He was advised to follow-up with psychiatry, patient reports she has not had anything back from psychiatry referral and a little bit concerned.  Patient will follow-up in the clinic 6 weeks Referral completed to psych.  Patient knows to follow-up with worsening unresolved symptoms.  Return in about 6 weeks (around 04/12/2022) for depression and anxiety.   Diagnostics:   Past Medical History: Patient Active Problem List   Diagnosis Date Noted   Anxiety 03/01/2022   Depression, recurrent (HCC) 03/01/2022   Overdose 01/13/2022   Suicide ideation 01/13/2022   Alteration consciousness 10/07/2019   HELLP (hemolytic anemia/elev liver enzymes/low platelets in pregnancy) 03/31/2019   Gestational thrombocytopenia (HCC) 03/29/2019   Labor and delivery, indication for care 03/29/2019   History of dental surgery-wisdom teeth extraction 01/03/2015   Facial abscess 01/02/2015   Trismus    Allergic rhinitis, seasonal 07/28/2014   Past Medical History:  Diagnosis Date   Anxiety and depression    Ovarian cyst    Pregnancy induced  hypertension    PTSD (post-traumatic stress disorder)    Recurrent tonsillitis 09/2017   current antibiotic, will finish 09/25/2017   Seizure-like activity  [facility])    Past Surgical History: Past Surgical History:  Procedure Laterality Date   CESAREAN SECTION N/A 03/31/2019   Procedure: CESAREAN SECTION;  Surgeon: Levi Aland, MD;  Location: [facility]S;  Service: Obstetrics;  Laterality: N/A;   TONSILLECTOMY N/A 10/01/2017   Procedure: TONSILLECTOMY;  Surgeon: Serena Colonel, MD;  Location: [facility];  Service: ENT;  Laterality: N/A;   TONSILLECTOMY     WISDOM TOOTH EXTRACTION  10/2014   Medication List:  Current Outpatient Medications  Medication Sig Dispense Refill   PARoxetine (PAXIL) 20 MG tablet Take 1 tablet (20 mg total) by mouth daily. 30 tablet 3   No current facility-administered medications for this visit.   Allergies: Allergies  Allergen Reactions   Dexamethasone Swelling   Nitrofurantoin Other (See Comments)    Unknown Unknown    Cariprazine Other (See Comments)    Seizure Seizure    Ciprofloxacin Rash and Hives   Social History: Social History   Socioeconomic History   Marital status: Single    Spouse name: Not on file   Number of children: 1   Years of education: 12   Highest education level: High school graduate  Occupational History   Occupation: Conservation officer, nature  Tobacco Use   Smoking status: Never   Smokeless tobacco: Never  Vaping Use   Vaping Use: Never used  Substance and Sexual Activity   Alcohol use: No   Drug use: No   Sexual activity: Yes    Birth control/protection: None  Other Topics Concern   Not on file  Social History Narrative   Lives at home with significant other and son.   Right-handed.   Occasional caffeine.   Social Determinants of Health   Financial Resource Strain: Not on file  Food Insecurity: Not on file  Transportation Needs: Not on file  Physical Activity: Not on file  Stress: Not on file  Social Connections: Not on file       Family History: Family History  Problem Relation Age of Onset   Ovarian cancer Mother    Skin cancer Mother         unsure of type - not melanoma   Healthy Father    Cancer Maternal Aunt        skin cancer   Cancer Maternal Uncle        skin cancer   Skin cancer Maternal Grandmother    Stroke Maternal Grandmother    Diabetes Maternal Grandmother    Hypertension Maternal Grandfather    Osteoporosis Maternal Grandfather    Heart attack Maternal Grandfather          Review of Systems  Constitutional: Negative.   HENT: Negative.    Eyes: Negative.   Respiratory: Negative.    Gastrointestinal: Negative.   Genitourinary: Negative.   Skin: Negative.   Psychiatric/Behavioral:  The patient is nervous/anxious.   All other systems reviewed and are negative.  Objective: BP 114/75   Pulse 88   Temp 98.1 F (36.7 C) (Temporal)   Ht 5\' 2"  (1.575 m)   Wt 176 lb 12.8 oz (80.2 kg)   LMP 02/22/2022 (Approximate)   SpO2 96%   BMI 32.34 kg/m  Body mass index is 32.34 kg/m. Physical Exam Vitals and nursing note reviewed.  Constitutional:      Appearance: Normal appearance.  HENT:  Head: Normocephalic.     Right Ear: External ear normal.     Left Ear: External ear normal.     Nose: Nose normal.  Eyes:     Conjunctiva/sclera: Conjunctivae normal.  Cardiovascular:     Rate and Rhythm: Normal rate and regular rhythm.     Pulses: Normal pulses.     Heart sounds: Normal heart sounds.  Pulmonary:     Effort: Pulmonary effort is normal.     Breath sounds: Normal breath sounds.  Abdominal:     General: Bowel sounds are normal.  Musculoskeletal:        General: Normal range of motion.  Skin:    General: Skin is warm.  Neurological:     General: No focal deficit present.     Mental Status: She is alert and oriented to person, place, and time.  Psychiatric:        Attention and Perception: Attention and perception normal.        Mood and Affect: Affect normal. Mood is anxious and depressed.        Behavior: Behavior normal. Behavior is cooperative.        Thought Content: Thought content  normal.    The plan was reviewed with the patient/family, and all questions/concerned were addressed.  It was my pleasure to see Kristie Crawford today and participate in her care. Please feel free to contact me with any questions or concerns.  Sincerely,  Lynnell Chad NP [facility]

## 2022-03-01 NOTE — Assessment & Plan Note (Addendum)
Patient is a 21 year old female who presents to clinic to establish care with complaints of depression.  Symptoms not well controlled.  Completed PHQ-9.  Started patient on paroxetine 20 mg tablet by mouth daily.  patient has a recent suicidal attempt 01/13/2022 and was hospitalized.  Patient was discharged after treatment and psych consultation.  He was advised to follow-up with psychiatry, patient reports she has not had anything back from psychiatry referral and a little bit concerned.  Patient will follow-up in the clinic 6 weeks Referral completed to psych.  Patient knows to follow-up with worsening unresolved symptoms.

## 2022-03-01 NOTE — Assessment & Plan Note (Signed)
Anxiety not well controlled.  Completed GAD-7.  Started patient on paroxetine 20 mg tablet by mouth daily.  Follow-up in 6 weeks.  Completed referral to psych.  Patient knows to follow-up with worsening unresolved symptoms.

## 2022-03-01 NOTE — Patient Instructions (Signed)
Generalized Anxiety Disorder, Adult Generalized anxiety disorder (GAD) is a mental health condition. Unlike normal worries, anxiety related to GAD is not triggered by a specific event. These worries do not fade or get better with time. GAD interferes with relationships, work, and school. GAD symptoms can vary from mild to severe. People with severe GAD can have intense waves of anxiety with physical symptoms that are similar to panic attacks. What are the causes? The exact cause of GAD is not known, but the following are believed to have an impact: Differences in natural brain chemicals. Genes passed down from parents to children. Differences in the way threats are perceived. Development and stress during childhood. Personality. What increases the risk? The following factors may make you more likely to develop this condition: Being female. Having a family history of anxiety disorders. Being very shy. Experiencing very stressful life events, such as the death of a loved one. Having a very stressful family environment. What are the signs or symptoms? People with GAD often worry excessively about many things in their lives, such as their health and family. Symptoms may also include: Mental and emotional symptoms: Worrying excessively about natural disasters. Fear of being late. Difficulty concentrating. Fears that others are judging your performance. Physical symptoms: Fatigue. Headaches, muscle tension, muscle twitches, trembling, or feeling shaky. Feeling like your heart is pounding or beating very fast. Feeling out of breath or like you cannot take a deep breath. Having trouble falling asleep or staying asleep, or experiencing restlessness. Sweating. Nausea, diarrhea, or irritable bowel syndrome (IBS). Behavioral symptoms: Experiencing erratic moods or irritability. Avoidance of new situations. Avoidance of people. Extreme difficulty making decisions. How is this diagnosed? This  condition is diagnosed based on your symptoms and medical history. You will also have a physical exam. Your health care provider may perform tests to rule out other possible causes of your symptoms. To be diagnosed with GAD, a person must have anxiety that: Is out of his or her control. Affects several different aspects of his or her life, such as work and relationships. Causes distress that makes him or her unable to take part in normal activities. Includes at least three symptoms of GAD, such as restlessness, fatigue, trouble concentrating, irritability, muscle tension, or sleep problems. Before your health care provider can confirm a diagnosis of GAD, these symptoms must be present more days than they are not, and they must last for 6 months or longer. How is this treated? This condition may be treated with: Medicine. Antidepressant medicine is usually prescribed for Certain-term daily control. Anti-anxiety medicines may be added in severe cases, especially when panic attacks occur. Talk therapy (psychotherapy). Certain types of talk therapy can be helpful in treating GAD by providing support, education, and guidance. Options include: Cognitive behavioral therapy (CBT). People learn coping skills and self-calming techniques to ease their physical symptoms. They learn to identify unrealistic thoughts and behaviors and to replace them with more appropriate thoughts and behaviors. Acceptance and commitment therapy (ACT). This treatment teaches people how to be mindful as a way to cope with unwanted thoughts and feelings. Biofeedback. This process trains you to manage your body's response (physiological response) through breathing techniques and relaxation methods. You will work with a therapist while machines are used to monitor your physical symptoms. Stress management techniques. These include yoga, meditation, and exercise. A mental health specialist can help determine which treatment is best for you.  Some people see improvement with one type of therapy. However, other people require   a combination of therapies. Follow these instructions at home: Lifestyle Maintain a consistent routine and schedule. Anticipate stressful situations. Create a plan and allow extra time to work with your plan. Practice stress management or self-calming techniques that you have learned from your therapist or your health care provider. Exercise regularly and spend time outdoors. Eat a healthy diet that includes plenty of vegetables, fruits, whole grains, low-fat dairy products, and lean protein. Do not eat a lot of foods that are high in fat, added sugar, or salt (sodium). Drink plenty of water. Avoid alcohol. Alcohol can increase anxiety. Avoid caffeine and certain over-the-counter cold medicines. These may make you feel worse. Ask your pharmacist which medicines to avoid. General instructions Take over-the-counter and prescription medicines only as told by your health care provider. Understand that you are likely to have setbacks. Accept this and be kind to yourself as you persist to take better care of yourself. Anticipate stressful situations. Create a plan and allow extra time to work with your plan. Recognize and accept your accomplishments, even if you judge them as small. Spend time with people who care about you. Keep all follow-up visits. This is important. Where to find more information National Institute of Mental Health: www.nimh.nih.gov Substance Abuse and Mental Health Services: www.samhsa.gov Contact a health care provider if: Your symptoms do not get better. Your symptoms get worse. You have signs of depression, such as: A persistently sad or irritable mood. Loss of enjoyment in activities that used to bring you joy. Change in weight or eating. Changes in sleeping habits. Get help right away if: You have thoughts about hurting yourself or others. If you ever feel like you may hurt  yourself or others, or have thoughts about taking your own life, get help right away. Go to your nearest emergency department or: Call your local emergency services (911 in the [place] Call a suicide crisis helpline, such as the National Suicide Prevention Lifeline at 1-800-273-8255 or 988 in the [place] This is open 24 hours a day in the [place] Text the Crisis Text Line at 741741 (in the [place] Summary Generalized anxiety disorder (GAD) is a mental health condition that involves worry that is not triggered by a specific event. People with GAD often worry excessively about many things in their lives, such as their health and family. GAD may cause symptoms such as restlessness, trouble concentrating, sleep problems, frequent sweating, nausea, diarrhea, headaches, and trembling or muscle twitching. A mental health specialist can help determine which treatment is best for you. Some people see improvement with one type of therapy. However, other people require a combination of therapies. This information is not intended to replace advice given to you by your health care provider. Make sure you discuss any questions you have with your health care provider. Document Revised: 02/23/2021 Document Reviewed: 11/21/2020 Elsevier Patient Education  2023 [employer]  Depression Screening Depression screening is a tool that your health care provider can use to learn if you have symptoms of depression. Depression is a common condition with many symptoms that are also often found in other conditions. Depression is treatable, but it must first be diagnosed. You may not know that certain feelings, thoughts, and behaviors that you are having can be symptoms of depression. Taking a depression screening test can help you and your health care provider decide if you need more assessment, or if you should be referred to a mental health care provider. What are the screening tests? You may have a physical   exam to see if another  condition is affecting your mental health. You may have a blood or urine sample taken during the physical exam. You may be interviewed or offered a written test using a screening tool that was developed from research, such as one of these: Patient Health Questionnaire (PHQ). This is a set of either 2 or 9 questions. A health care provider who has been trained to score this screening test uses a guide to assess if your symptoms suggest that you may have depression. Hamilton Depression Rating Scale (HAM-D). This is a set of either 17 or 24 questions. You may be asked to take it again during or after your treatment, to see if your depression has gotten better. Beck Depression Inventory (BDI). This is a set of 21 multiple choice questions. Your health care provider scores your answers to assess: Your level of depression, ranging from mild to severe. Your response to treatment. Your health care provider may talk with you about your daily activities, such as eating, sleeping, work, and recreation, and ask if you have had any changes in activity. Your health care provider may ask you to see a mental health specialist, such as a psychiatrist or psychologist, for more evaluation. Who should be screened for depression?  All adults, including adults with a family history of a mental health disorder. People who are 10-21 years old. People who are recovering from an acute condition, such as myocardial infarction (MI) or stroke. Pregnant women, or women who have given birth. People who have a [place]-term (chronic) illness. Anyone who has been diagnosed with another type of mental health disorder. Anyone who has symptoms that could show depression. What do my results mean? Your health care provider will review the results of your depression screening, physical exam, and lab tests. Positive screens suggest that you may have depression. Screening is the first step in getting the care that you may need. It will be  important for you to know the results of your tests. Ask your health care provider, or the department that is doing your screening tests, when your results will be ready. Talk with your health care provider about your results, diagnosis, and recommendations for follow-up. A diagnosis of depression is made using information from the Diagnostic and Statistical Manual of Mental Disorders (DSM-5). This is a book that lists the number and type of symptoms that must be present for a health care provider to give a specific diagnosis. Your health care provider may work with you to treat your symptoms of depression, or your health care provider may help you find a mental health provider who can assess and help you develop a plan to treat your depression. Get help right away if: You have thoughts about hurting yourself or others. If you ever feel like you may hurt yourself or others, or have thoughts about taking your own life, get help right away. Go to your nearest emergency department or: Call your local emergency services (911 in the [place] Call a suicide crisis helpline, such as the National Suicide Prevention Lifeline at 1-800-273-8255 or 988 in the [place] This is open 24 hours a day in the [place] Text the Crisis Text Line at 741741 (in the [place] Summary Depression screening is the first step in getting the help that you may need. If your screening test shows symptoms of depression (is positive), your health care provider may ask you to see a mental health provider who will help identify ways to treat your depression.   Anyone aged 10 or older should be screened for depression. This information is not intended to replace advice given to you by your health care provider. Make sure you discuss any questions you have with your health care provider. Document Revised: 02/23/2021 Document Reviewed: 11/08/2020 Elsevier Patient Education  2023 [employer]   

## 2022-03-08 ENCOUNTER — Encounter: Payer: Self-pay | Admitting: Nurse Practitioner

## 2022-03-12 ENCOUNTER — Other Ambulatory Visit: Payer: Self-pay | Admitting: Nurse Practitioner

## 2022-03-12 NOTE — Telephone Encounter (Signed)
If you have not heard form your therapist, please go to the psychiatry  emergency room  if your anxiety and depression is not controlled. Your follow up appointment  from the hospital wanted you to follow up with piscary ASAP not therapist, the therapist will help but the psychiatry will help manage your medication. Do let me know how I can help you. It takes 6 -12 weeks for your medication to work

## 2022-03-14 DIAGNOSIS — Z114 Encounter for screening for human immunodeficiency virus [HIV]: Secondary | ICD-10-CM | POA: Diagnosis not present

## 2022-03-14 DIAGNOSIS — Z113 Encounter for screening for infections with a predominantly sexual mode of transmission: Secondary | ICD-10-CM | POA: Diagnosis not present

## 2022-03-14 DIAGNOSIS — R102 Pelvic and perineal pain: Secondary | ICD-10-CM | POA: Diagnosis not present

## 2022-03-14 DIAGNOSIS — N926 Irregular menstruation, unspecified: Secondary | ICD-10-CM | POA: Diagnosis not present

## 2022-03-20 DIAGNOSIS — R102 Pelvic and perineal pain: Secondary | ICD-10-CM | POA: Diagnosis not present

## 2022-03-30 ENCOUNTER — Encounter: Payer: Self-pay | Admitting: Nurse Practitioner

## 2022-04-12 ENCOUNTER — Encounter: Payer: Self-pay | Admitting: Nurse Practitioner

## 2022-04-12 ENCOUNTER — Ambulatory Visit: Payer: Medicaid Other | Admitting: Nurse Practitioner

## 2022-04-19 ENCOUNTER — Ambulatory Visit: Payer: Medicaid Other | Admitting: Nurse Practitioner

## 2022-04-20 ENCOUNTER — Encounter: Payer: Self-pay | Admitting: Nurse Practitioner

## 2022-04-25 DIAGNOSIS — F331 Major depressive disorder, recurrent, moderate: Secondary | ICD-10-CM | POA: Diagnosis not present

## 2022-04-28 ENCOUNTER — Other Ambulatory Visit: Payer: Self-pay | Admitting: Nurse Practitioner

## 2022-04-28 DIAGNOSIS — F339 Major depressive disorder, recurrent, unspecified: Secondary | ICD-10-CM

## 2022-04-28 DIAGNOSIS — F419 Anxiety disorder, unspecified: Secondary | ICD-10-CM

## 2022-04-28 NOTE — Telephone Encounter (Signed)
Pt has no showed twice with you. Ok to refill or does pt ntbs?

## 2022-05-01 NOTE — Telephone Encounter (Signed)
Appt made for 05/01/2022

## 2022-05-03 ENCOUNTER — Other Ambulatory Visit: Payer: Self-pay | Admitting: Nurse Practitioner

## 2022-05-03 ENCOUNTER — Telehealth (INDEPENDENT_AMBULATORY_CARE_PROVIDER_SITE_OTHER): Payer: Medicaid Other | Admitting: Psychiatry

## 2022-05-03 ENCOUNTER — Encounter (HOSPITAL_COMMUNITY): Payer: Self-pay | Admitting: Psychiatry

## 2022-05-03 DIAGNOSIS — F4312 Post-traumatic stress disorder, chronic: Secondary | ICD-10-CM

## 2022-05-03 DIAGNOSIS — F411 Generalized anxiety disorder: Secondary | ICD-10-CM | POA: Diagnosis not present

## 2022-05-03 DIAGNOSIS — F331 Major depressive disorder, recurrent, moderate: Secondary | ICD-10-CM | POA: Diagnosis not present

## 2022-05-03 DIAGNOSIS — F4001 Agoraphobia with panic disorder: Secondary | ICD-10-CM | POA: Diagnosis not present

## 2022-05-03 DIAGNOSIS — F410 Panic disorder [episodic paroxysmal anxiety] without agoraphobia: Secondary | ICD-10-CM

## 2022-05-03 DIAGNOSIS — Z9189 Other specified personal risk factors, not elsewhere classified: Secondary | ICD-10-CM

## 2022-05-03 DIAGNOSIS — F502 Bulimia nervosa: Secondary | ICD-10-CM

## 2022-05-03 MED ORDER — FLUOXETINE HCL 20 MG PO CAPS: 20.0000 mg | ORAL_CAPSULE | Freq: Every day | ORAL | 0 refills | Status: DC
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
## 2022-05-03 NOTE — Patient Instructions (Signed)
We discontinued your paxil today in favor of starting fluoxetine (prozac). Take fluoxetine 20mg  once daily by mouth. We will plan on follow up in 2 weeks to see how this is going. Please message Dr. Nehemiah Settle with the name and phone number of your therapist after your intake session on 05/08/22. You may need to sign a release of information with them.

## 2022-05-05 ENCOUNTER — Telehealth (INDEPENDENT_AMBULATORY_CARE_PROVIDER_SITE_OTHER): Payer: Medicaid Other | Admitting: Nurse Practitioner

## 2022-05-05 DIAGNOSIS — G44019 Episodic cluster headache, not intractable: Secondary | ICD-10-CM

## 2022-05-05 DIAGNOSIS — F339 Major depressive disorder, recurrent, unspecified: Secondary | ICD-10-CM | POA: Diagnosis not present

## 2022-05-05 DIAGNOSIS — F419 Anxiety disorder, unspecified: Secondary | ICD-10-CM

## 2022-05-05 NOTE — Assessment & Plan Note (Signed)
Anxiety controlled on Prozac.  No changes necessary.  Patient knows to follow-up as needed

## 2022-05-05 NOTE — Progress Notes (Signed)
Virtual Visit via Video Note   This visit type was conducted due to national recommendations for restrictions regarding the COVID-19 Pandemic (e.g. social distancing) in an effort to limit this patient's exposure and mitigate transmission in our community.  Due to her co-morbid illnesses, this patient is at least at moderate risk for complications without adequate follow up.  This format is felt to be most appropriate for this patient at this time.  All issues noted in this document were discussed and addressed.  A limited physical exam was performed with this format.  A verbal consent was obtained for the virtual visit.   Date:  05/05/2022   ID:  Kristie Crawford, DOB [birth year], MRN [id number]  Patient Location: Home Provider Location: Home Office  PCP:  Ivy Lynn, NP   Evaluation Performed:  Follow-Up Visit  Chief Complaint: Depression and headache  History of Present Illness:    Kristie Crawford is a 21 y.o. female with Depression: Patient complains of depression. She complains of depressed mood and difficulty concentrating. Onset was approximately a few years ago, stable since that time.  She denies current suicidal and homicidal plan or intent.   Family history significant for no psychiatric illness.Possible organic causes contributing are: none.  Risk factors: previous episode of depression Previous treatment includes Prozac. She complains of the following side effects from the treatment: none.  Patient was recently evaluated by psychiatry and changes were made to her medication.  Patient is currently on Prozac 20 mg tablet that is effective for symptoms.  Headache: Patient complains of headache. She does not have a headache at this time.   Description of Headaches: Location of pain: left-sided unilateral, bilateral, occipital Radiation of pain?:none Character of pain:aching, sharp, and shooting Severity of pain: 8 Accompanying symptoms: Patient describes symptoms as  seizure-like activities. Prodromal sx?: decreased social functioning Rapidity of onset: gradual Typical duration of individual headache: a few days Are most headaches similar in presentation? yes Typical precipitants: stress  Temporal Pattern of Headaches: Started having HAs a few months ago Worst time of day: afternoon Awaken from sleep?: no Seasonal pattern?: no headaches happen when have panic attacks or high stress. 'Clustering' of HAs over time? yes  Overall pattern since problem began: unchanged  Degree of Functional Impairment: mild  Current Use of Meds to Treat HA: Abortive meds? acetaminophen Daily use? no Prophylactic meds? None   Additional Relevant History: History of head/neck trauma? no History of head/neck surgery? no Family h/o headache problems? no Use of meds that might worsen HAs? no Exposure to carbon monoxide? no Substance use: No    The patient does not have symptoms concerning for COVID-19 infection (fever, chills, cough, or new shortness of breath).    Past Medical History:  Diagnosis Date   Anxiety and depression    Ovarian cyst    Pregnancy induced hypertension    PTSD (post-traumatic stress disorder)    Recurrent tonsillitis 09/2017   current antibiotic, will finish 09/25/2017   Seizure-like activity [facility])     Past Surgical History:  Procedure Laterality Date   CESAREAN SECTION N/A 03/31/2019   Procedure: CESAREAN SECTION;  Surgeon: Olga Millers, MD;  Location: [facility] LD [facility]S;  Service: Obstetrics;  Laterality: N/A;   TONSILLECTOMY N/A 10/01/2017   Procedure: TONSILLECTOMY;  Surgeon: Izora Gala, MD;  Location: [place];  Service: ENT;  Laterality: N/A;   TONSILLECTOMY     WISDOM TOOTH EXTRACTION  10/2014    Family History  Problem Relation Age of Onset   Ovarian cancer Mother    Skin cancer Mother        unsure of type - not melanoma   Healthy Father    Cancer Maternal Aunt        skin cancer   Cancer Maternal  Uncle        skin cancer   Skin cancer Maternal Grandmother    Stroke Maternal Grandmother    Diabetes Maternal Grandmother    Hypertension Maternal Grandfather    Osteoporosis Maternal Grandfather    Heart attack Maternal Grandfather     Social History   Socioeconomic History   Marital status: Single    Spouse name: Not on file   Number of children: 1   Years of education: 12   Highest education level: High school graduate  Occupational History   Occupation: Conservation officer, nature  Tobacco Use   Smoking status: Never   Smokeless tobacco: Never  Vaping Use   Vaping Use: Never used  Substance and Sexual Activity   Alcohol use: Not Currently    Comment: 1-2 on holidays; none since New Years 2023   Drug use: No    Comment: tried delta 8 vape once   Sexual activity: Yes    Birth control/protection: None  Other Topics Concern   Not on file  Social History Narrative   Lives at home with significant other and son.   Right-handed.   Occasional caffeine.   Social Determinants of Health   Financial Resource Strain: Not on file  Food Insecurity: Not on file  Transportation Needs: Not on file  Physical Activity: Not on file  Stress: Not on file  Social Connections: Not on file  Intimate Partner Violence: Not on file    Outpatient Medications Prior to Visit  Medication Sig Dispense Refill   FLUoxetine (PROZAC) 20 MG capsule Take 1 capsule (20 mg total) by mouth daily. 30 capsule 0   No facility-administered medications prior to visit.    Allergies:   Dexamethasone, Nitrofurantoin, Cariprazine, and Ciprofloxacin   Social History   Tobacco Use   Smoking status: Never   Smokeless tobacco: Never  Vaping Use   Vaping Use: Never used  Substance Use Topics   Alcohol use: Not Currently    Comment: 1-2 on holidays; none since New Years 2023   Drug use: No    Comment: tried delta 8 vape once     Review of Systems  Constitutional: Negative.   HENT: Negative.    Respiratory:  Negative.    Cardiovascular: Negative.   Genitourinary: Negative.   Skin: Negative.   Neurological:  Positive for headaches.  Psychiatric/Behavioral:  Positive for depression. The patient is nervous/anxious.   All other systems reviewed and are negative.    Labs/Other Tests and Data Reviewed:    Recent Labs: 01/13/2022: ALT 16; Magnesium 2.0 01/14/2022: BUN 10; Creatinine, Ser 0.60; Hemoglobin 12.6; Platelets 206; Potassium 3.7; Sodium 140   Recent Lipid Panel No results found for: "CHOL", "TRIG", "HDL", "CHOLHDL", "LDLCALC", "LDLDIRECT"  Wt Readings from Last 3 Encounters:  03/01/22 176 lb 12.8 oz (80.2 kg)  01/13/22 172 lb 9.9 oz (78.3 kg)  11/20/21 135 lb (61.2 kg)     Objective:    Vital Signs:  There were no vitals taken for this visit.   Physical Exam Vitals and nursing note reviewed.  Constitutional:      Appearance: Normal appearance.  HENT:     Nose: Nose normal.  Skin:  General: Skin is warm.     Findings: Erythema present. No rash.  Neurological:     Mental Status: She is alert.   Limited physical exam due to video visit.  ASSESSMENT & PLAN:   1. Episodic cluster headache, not intractable - Ambulatory referral to Neurology  2. Depression, recurrent (HCC)  3. Anxiety  Episodic cluster headache, not intractable Patient is to use Tylenol, increase hydration, reduce stress to help with headache.  Completed referral to neurology per patient's request as headaches presents like seizure activities.  Patient has no seizure activity today.  Depression, recurrent (HCC) Patient is currently on Prozac, she reports medication is managing her symptoms.  Anxiety Anxiety controlled on Prozac.  No changes necessary.  Patient knows to follow-up as needed   Orders Placed This Encounter  Procedures   Ambulatory referral to Neurology     No orders of the defined types were placed in this encounter.   COVID-19 Education: The signs and symptoms of COVID-19 were  discussed with the patient and how to seek care for testing (follow up with PCP or arrange E-visit). The importance of social distancing was discussed today.  Time:   Today, I have spent 18 minutes with the patient with telehealth technology discussing the above problems.    Follow Up:  Virtual Visit  prn  Signed, Daryll Drown, NP  05/05/2022 2:49 PM    [facility]

## 2022-05-05 NOTE — Assessment & Plan Note (Signed)
Patient is currently on Prozac, she reports medication is managing her symptoms.

## 2022-05-05 NOTE — Assessment & Plan Note (Signed)
Patient is to use Tylenol, increase hydration, reduce stress to help with headache.  Completed referral to neurology per patient's request as headaches presents like seizure activities.  Patient has no seizure activity today.

## 2022-05-08 DIAGNOSIS — F331 Major depressive disorder, recurrent, moderate: Secondary | ICD-10-CM | POA: Diagnosis not present

## 2022-05-09 ENCOUNTER — Telehealth (HOSPITAL_COMMUNITY): Payer: Self-pay

## 2022-05-09 NOTE — Telephone Encounter (Signed)
Pt called in to provide therapist number for Dr Nehemiah Settle [phone] write care services Mrs. Kristie Crawford was her therapist. Please advise

## 2022-05-11 ENCOUNTER — Encounter (HOSPITAL_COMMUNITY): Payer: Self-pay

## 2022-05-11 ENCOUNTER — Emergency Department (HOSPITAL_COMMUNITY): Payer: BC Managed Care – PPO

## 2022-05-11 ENCOUNTER — Other Ambulatory Visit: Payer: Self-pay

## 2022-05-11 ENCOUNTER — Emergency Department (HOSPITAL_COMMUNITY)
Admission: EM | Admit: 2022-05-11 | Discharge: 2022-05-11 | Disposition: A | Discharge status: Home / Self Care | Payer: BC Managed Care – PPO | Attending: Emergency Medicine | Admitting: Emergency Medicine

## 2022-05-11 DIAGNOSIS — R0789 Other chest pain: Secondary | ICD-10-CM | POA: Diagnosis not present

## 2022-05-11 DIAGNOSIS — R519 Headache, unspecified: Secondary | ICD-10-CM | POA: Diagnosis not present

## 2022-05-11 DIAGNOSIS — R569 Unspecified convulsions: Secondary | ICD-10-CM | POA: Diagnosis not present

## 2022-05-11 LAB — CBG MONITORING, ED: Glucose-Capillary: 87 mg/dL (ref 70–99)

## 2022-05-11 LAB — RAPID URINE DRUG SCREEN, HOSP PERFORMED
Amphetamines: NOT DETECTED
Barbiturates: NOT DETECTED
Benzodiazepines: NOT DETECTED
Cocaine: NOT DETECTED
Opiates: NOT DETECTED
Tetrahydrocannabinol: NOT DETECTED

## 2022-05-11 LAB — CBC WITH DIFFERENTIAL/PLATELET
Abs Immature Granulocytes: 0.02 10*3/uL (ref 0.00–0.07)
Basophils Absolute: 0 10*3/uL (ref 0.0–0.1)
Basophils Relative: 0 %
Eosinophils Absolute: 0.1 10*3/uL (ref 0.0–0.5)
Eosinophils Relative: 1 %
HCT: 36.9 % (ref 36.0–46.0)
Hemoglobin: 12.3 g/dL (ref 12.0–15.0)
Immature Granulocytes: 0 %
Lymphocytes Relative: 29 %
Lymphs Abs: 2.7 10*3/uL (ref 0.7–4.0)
MCH: 27.8 pg (ref 26.0–34.0)
MCHC: 33.3 g/dL (ref 30.0–36.0)
MCV: 83.5 fL (ref 80.0–100.0)
Monocytes Absolute: 0.7 10*3/uL (ref 0.1–1.0)
Monocytes Relative: 7 %
Neutro Abs: 5.8 10*3/uL (ref 1.7–7.7)
Neutrophils Relative %: 63 %
Platelets: 217 10*3/uL (ref 150–400)
RBC: 4.42 MIL/uL (ref 3.87–5.11)
RDW: 13.2 % (ref 11.5–15.5)
WBC: 9.3 10*3/uL (ref 4.0–10.5)
nRBC: 0 % (ref 0.0–0.2)

## 2022-05-11 LAB — I-STAT BETA HCG BLOOD, ED (MC, WL, AP ONLY): I-stat hCG, quantitative: <5 m[IU]/mL (ref <5)

## 2022-05-11 LAB — TROPONIN I (HIGH SENSITIVITY): Troponin I (High Sensitivity): <2 ng/L (ref <18)

## 2022-05-11 LAB — COMPREHENSIVE METABOLIC PANEL
ALT: 15 U/L (ref 0–44)
AST: 16 U/L (ref 15–41)
Albumin: 3.9 g/dL (ref 3.5–5.0)
Alkaline Phosphatase: 52 U/L (ref 38–126)
Anion gap: 9 (ref 5–15)
BUN: 6 mg/dL (ref 6–20)
CO2: 22 mmol/L (ref 22–32)
Calcium: 8.7 mg/dL — ABNORMAL LOW (ref 8.9–10.3)
Chloride: 108 mmol/L (ref 98–111)
Creatinine, Ser: 0.61 mg/dL (ref 0.44–1.00)
GFR, Estimated: >60 mL/min (ref >60)
Glucose, Bld: 91 mg/dL (ref 70–99)
Potassium: 4 mmol/L (ref 3.5–5.1)
Sodium: 139 mmol/L (ref 135–145)
Total Bilirubin: 0.7 mg/dL (ref 0.3–1.2)
Total Protein: 6.5 g/dL (ref 6.5–8.1)

## 2022-05-11 LAB — URINALYSIS, ROUTINE W REFLEX MICROSCOPIC
Bilirubin Urine: NEGATIVE
Glucose, UA: NEGATIVE mg/dL
Hgb urine dipstick: NEGATIVE
Ketones, ur: NEGATIVE mg/dL
Leukocytes,Ua: NEGATIVE
Nitrite: NEGATIVE
Protein, ur: NEGATIVE mg/dL
Specific Gravity, Urine: 1.008 (ref 1.005–1.030)
pH: 5 (ref 5.0–8.0)

## 2022-05-11 LAB — ETHANOL: Alcohol, Ethyl (B): 89 mg/dL — ABNORMAL HIGH (ref <10)

## 2022-05-11 LAB — MAGNESIUM: Magnesium: 1.8 mg/dL (ref 1.7–2.4)

## 2022-05-11 NOTE — ED Triage Notes (Signed)
Pt at friends house where she had 3 witnessed seizures back to back.  Whole body jerking movements.   No seizure activity for EMS, pt postictal on arrival    Left sided jaw pain and pinching on left ribs with deep breath and left back/flank pain.  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

## 2022-05-11 NOTE — ED Provider Notes (Signed)
[facility] EMERGENCY DEPARTMENT Provider Note   CSN: [id number] Arrival date & time: 05/11/22  3559     History  Chief Complaint  Patient presents with   Seizures    Kristie Crawford is a 21 y.o. female.  The history is provided by the patient, the EMS personnel and medical records.  Seizures Kristie Crawford is a 21 y.o. female who presents to the Emergency Department complaining of seizure.  She presents to the emergency department by EMS for evaluation following seizure activity.  She has a remote history of seizure activity but does not have a formal diagnosis of epilepsy.  She has had 3 separate episodes throughout her life span of seizure activity.  Last episode was about 9 months ago.  Tonight she was drinking alcohol with friends.  About 2 drinks per report.  She had 3 episodes that appeared to be seizure activity per her significant other.  He states that she stiffened up and had some twitching to all extremities that lasted for a few minutes and then she would rest but not interact for several minutes and had 2 additional episodes for 3 total episodes of twitching and jerking and seizure-like activity.  On EMS arrival the activity had stopped but she appeared postictal.  There is report of urinary incontinence.  No recent illnesses.  Complains of mild headache as well as some left-sided chest discomfort.  No known medical problems.  She did recently started on fluoxetine and has only taken to 4 doses.  No tobacco or drug use.    Home Medications Prior to Admission medications   Medication Sig Start Date End Date Taking? Authorizing Provider  FLUoxetine (PROZAC) 20 MG capsule Take 1 capsule (20 mg total) by mouth daily. 05/03/22 06/02/22  Jacquelynn Cree, MD      Allergies    Dexamethasone, Nitrofurantoin, Cariprazine, and Ciprofloxacin    Review of Systems   Review of Systems  Neurological:  Positive for seizures.  All other systems reviewed and  are negative.   Physical Exam Updated Vital Signs BP 116/79   Pulse 69   Temp (!) 97.2 F (36.2 C) (Oral)   Resp 18   Ht 5\' 2"  (1.575 m)   Wt 77.1 kg   SpO2 98%   BMI 31.09 kg/m  Physical Exam Vitals and nursing note reviewed.  Constitutional:      Appearance: She is well-developed.  HENT:     Head: Normocephalic and atraumatic.  Cardiovascular:     Rate and Rhythm: Normal rate and regular rhythm.     Heart sounds: No murmur heard. Pulmonary:     Effort: Pulmonary effort is normal. No respiratory distress.     Breath sounds: Normal breath sounds.  Abdominal:     Palpations: Abdomen is soft.     Tenderness: There is no abdominal tenderness. There is no guarding or rebound.  Musculoskeletal:        General: No swelling or tenderness.  Skin:    General: Skin is warm and dry.  Neurological:     Mental Status: She is alert and oriented to person, place, and time.     Comments: No asymmetry of facial movements.  5 out of 5 strength in all 4 extremities  Psychiatric:        Behavior: Behavior normal.     ED Results / Procedures / Treatments   Labs (all labs ordered are listed, but only abnormal results are displayed) Labs Reviewed  COMPREHENSIVE  METABOLIC PANEL - Abnormal; Notable for the following components:      Result Value   Calcium 8.7 (*)    All other components within normal limits  URINALYSIS, ROUTINE W REFLEX MICROSCOPIC - Abnormal; Notable for the following components:   Color, Urine STRAW (*)    All other components within normal limits  ETHANOL - Abnormal; Notable for the following components:   Alcohol, Ethyl (B) 89 (*)    All other components within normal limits  CBC WITH DIFFERENTIAL/PLATELET  RAPID URINE DRUG SCREEN, HOSP PERFORMED  MAGNESIUM  I-STAT BETA HCG BLOOD, ED ([facility], [facility], [facility] ONLY)  CBG MONITORING, ED  TROPONIN I (HIGH SENSITIVITY)    EKG EKG Interpretation  Date/Time:  Thursday May 11 2022 04:56:08 EDT Ventricular Rate:   69 PR Interval:  130 QRS Duration: 88 QT Interval:  404 QTC Calculation: 433 R Axis:   60 Text Interpretation: Sinus rhythm Borderline Q waves in inferior leads Confirmed by Tilden Fossa [phone]) on 05/11/2022 6:02:49 AM  Radiology CT Head Wo Contrast  Result Date: 05/11/2022 CLINICAL DATA:  21 year old female with history of new onset of seizure. No history of trauma. EXAM: CT HEAD WITHOUT CONTRAST TECHNIQUE: Contiguous axial images were obtained from the base of the skull through the vertex without intravenous contrast. RADIATION DOSE REDUCTION: This exam was performed according to the departmental dose-optimization program which includes automated exposure control, adjustment of the mA and/or kV according to patient size and/or use of iterative reconstruction technique. COMPARISON:  Head CT 09/24/2019.  Brain MRI 09/24/2019. FINDINGS: Brain: Cavum septum pellucidum (normal anatomical variant) incidentally noted. No evidence of acute infarction, hemorrhage, hydrocephalus, extra-axial collection or mass lesion/mass effect. Vascular: No hyperdense vessel or unexpected calcification. Skull: Normal. Negative for fracture or focal lesion. Sinuses/Orbits: No acute finding. Other: None. IMPRESSION: 1. No acute intracranial abnormalities to account for the patient's symptoms. The appearance of the brain is normal. Electronically Signed   By: Trudie Reed M.D.   On: 05/11/2022 06:36   DG Chest 2 View  Result Date: 05/11/2022 CLINICAL DATA:  21 year old female with history of chest pain. Seizures. EXAM: CHEST - 2 VIEW COMPARISON:  Chest x-ray 09/20/2021. FINDINGS: Lung volumes are normal. No consolidative airspace disease. No pleural effusions. No pneumothorax. No pulmonary nodule or mass noted. Pulmonary vasculature and the cardiomediastinal silhouette are within normal limits. IMPRESSION: No radiographic evidence of acute cardiopulmonary disease. Electronically Signed   By: Trudie Reed M.D.   On:  05/11/2022 06:34    Procedures Procedures    Medications Ordered in ED Medications - No data to display  ED Course/ Medical Decision Making/ A&P                           Medical Decision Making Amount and/or Complexity of Data Reviewed Labs: ordered. Radiology: ordered.   Patient with history of prior remote seizure-like activity but no formal diagnosis of epilepsy here for evaluation following recurrent episode of seizure-like activity.  Patient has no focal neurologic deficits on evaluation.  No serious electrolyte derangements.  CT head is negative for acute abnormality.  No clinical concern for acute arrhythmia.  Presentation is not consistent with PE, ACS.  Discussed with on-call neurologist.  Will not start neuroleptic at this time.  Plan to discharge with seizure precautions with close neurology follow-up for further outpatient work-up.        Final Clinical Impression(s) / ED Diagnoses Final diagnoses:  Seizure-like activity (HCC)  Rx / DC Orders ED Discharge Orders     None         Tilden Fossa, MD 05/11/22 [id number]

## 2022-05-11 NOTE — Discharge Instructions (Signed)
You are not allowed to operate a motor vehicle for the next 6 months since your seizure.  Please call to follow-up closely with neurology.

## 2022-05-13 DIAGNOSIS — Z5321 Procedure and treatment not carried out due to patient leaving prior to being seen by health care provider: Secondary | ICD-10-CM | POA: Diagnosis not present

## 2022-05-13 DIAGNOSIS — R300 Dysuria: Secondary | ICD-10-CM | POA: Diagnosis not present

## 2022-05-13 DIAGNOSIS — Z6831 Body mass index (BMI) 31.0-31.9, adult: Secondary | ICD-10-CM | POA: Diagnosis not present

## 2022-05-13 DIAGNOSIS — N12 Tubulo-interstitial nephritis, not specified as acute or chronic: Secondary | ICD-10-CM | POA: Diagnosis not present

## 2022-05-13 DIAGNOSIS — N3001 Acute cystitis with hematuria: Secondary | ICD-10-CM | POA: Diagnosis not present

## 2022-05-15 ENCOUNTER — Telehealth: Payer: Self-pay

## 2022-05-15 NOTE — Telephone Encounter (Signed)
Transition Care Management Follow-up Telephone Call Date of discharge and from where: 05/11/22 [facility] for seizure like activity - taken by EMS  05/13/22 [facility] for LBP - UTI (left and went to [facility])  How have you been since you were released from the hospital? Patient states that she is doing well - discontinued Prozac following 05/11/22 visit and is still on the antibiotic for the UTI   Any questions or concerns? No  Items Reviewed: Did the pt receive and understand the discharge instructions provided? Yes  Medications obtained and verified? Yes  Other? Yes  Any new allergies since your discharge? Yes  Dietary orders reviewed? No Do you have support at home? Yes   Home Care and Equipment/Supplies: Were home health services ordered? not applicable If so, what is the name of the agency? na  Has the agency set up a time to come to the patient's home? not applicable Were any new equipment or medical supplies ordered?  No What is the name of the medical supply agency? ns Were you able to get the supplies/equipment? not applicable Do you have any questions related to the use of the equipment or supplies? No  Functional Questionnaire: (I = Independent and D = Dependent) ADLs: I  Bathing/Dressing- I  Meal Prep- I  Eating- I  Maintaining continence- I  Transferring/Ambulation- I  Managing Meds- I  Follow up appointments reviewed:  PCP Hospital f/u appt confirmed?  Patient declines appt at this time will follow up as needed    [facility] f/u appt confirmed? Yes  Scheduled to see Stinson with [facility] on 05/17/22 @ 11am         Scheduled to see Krista Blue with Neurology on 06/07/2022   @ 830am        Scheduled with Urology per patient on 06/13/2022 Are transportation arrangements needed? No  If their condition worsens, is the pt aware to call PCP or go to the Emergency Dept.? Yes Was the patient provided with contact information for the PCP's office or ED?  Yes Was to pt encouraged to call back with questions or concerns? Yes

## 2022-05-17 ENCOUNTER — Encounter (HOSPITAL_COMMUNITY): Payer: Self-pay | Admitting: Psychiatry

## 2022-05-17 ENCOUNTER — Encounter (HOSPITAL_COMMUNITY): Payer: Self-pay

## 2022-05-17 ENCOUNTER — Telehealth (INDEPENDENT_AMBULATORY_CARE_PROVIDER_SITE_OTHER): Payer: BC Managed Care – PPO | Admitting: Psychiatry

## 2022-05-17 DIAGNOSIS — F411 Generalized anxiety disorder: Secondary | ICD-10-CM | POA: Diagnosis not present

## 2022-05-17 DIAGNOSIS — F4310 Post-traumatic stress disorder, unspecified: Secondary | ICD-10-CM

## 2022-05-17 DIAGNOSIS — F410 Panic disorder [episodic paroxysmal anxiety] without agoraphobia: Secondary | ICD-10-CM

## 2022-05-17 DIAGNOSIS — R569 Unspecified convulsions: Secondary | ICD-10-CM

## 2022-05-17 DIAGNOSIS — F331 Major depressive disorder, recurrent, moderate: Secondary | ICD-10-CM | POA: Insufficient documentation

## 2022-05-17 DIAGNOSIS — F3341 Major depressive disorder, recurrent, in partial remission: Secondary | ICD-10-CM | POA: Insufficient documentation

## 2022-05-17 DIAGNOSIS — F502 Bulimia nervosa: Secondary | ICD-10-CM | POA: Insufficient documentation

## 2022-05-17 DIAGNOSIS — F4001 Agoraphobia with panic disorder: Secondary | ICD-10-CM | POA: Insufficient documentation

## 2022-05-17 MED ORDER — MIRTAZAPINE 15 MG PO TABS: ORAL_TABLET | ORAL | 2 refills | Status: DC
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
## 2022-05-17 NOTE — Patient Instructions (Signed)
We discontinued your fluoxetine due to side effects. We added remeron (mirtazapine) to your regimen and you can begin taking half a tablet nightly for the next 8-10 days before increasing to the full tablet. Send Dr. Nehemiah Settle a mychart to let him know how things are going. Also, remind your therapist to give Dr. Nehemiah Settle a call to discuss your case.

## 2022-05-18 ENCOUNTER — Encounter: Payer: Self-pay | Admitting: Nurse Practitioner

## 2022-05-19 NOTE — Telephone Encounter (Signed)
Schedule patient an office visit, thank you

## 2022-05-22 ENCOUNTER — Ambulatory Visit: Payer: Medicaid Other | Admitting: Nurse Practitioner

## 2022-05-23 DIAGNOSIS — F331 Major depressive disorder, recurrent, moderate: Secondary | ICD-10-CM | POA: Diagnosis not present

## 2022-05-29 ENCOUNTER — Encounter: Payer: Self-pay | Admitting: Nurse Practitioner

## 2022-05-29 ENCOUNTER — Ambulatory Visit: Payer: BC Managed Care – PPO | Admitting: Nurse Practitioner

## 2022-05-29 VITALS — Temp 98.7°F | Ht 62.0 in | Wt 181.0 lb

## 2022-05-29 DIAGNOSIS — Z23 Encounter for immunization: Secondary | ICD-10-CM | POA: Diagnosis not present

## 2022-05-29 DIAGNOSIS — E559 Vitamin D deficiency, unspecified: Secondary | ICD-10-CM

## 2022-05-29 DIAGNOSIS — R6889 Other general symptoms and signs: Secondary | ICD-10-CM | POA: Diagnosis not present

## 2022-05-29 NOTE — Patient Instructions (Addendum)
Hematoma A hematoma is a collection of blood. A hematoma can happen: Under the skin. In an organ. In a body space. In a joint space. In other tissues. The blood can thicken (clot) to form a lump that you can see and feel. The lump is often hard and may become sore and tender. The lump can be very small or very big. Most hematomas get better in a few days to weeks. However, some hematomas may be serious and need medical care. What are the causes? This condition is caused by: An injury. Blood that leaks under the skin. Problems from surgeries. Medical conditions that cause bleeding or bruising. What increases the risk? You are more likely to develop this condition if: You are an older adult. You use medicines that thin your blood. What are the signs or symptoms? Symptoms depend on where the hematoma is in your body. If the hematoma is under the skin, there is: A firm lump on the body. Pain and tenderness in the area. Bruising. The skin above the lump may be blue, dark blue, purple-red, or yellowish. If the hematoma is deep in the tissues or body spaces, there may be: Blood in the stomach. This may cause pain in the belly (abdomen), weakness, passing out (fainting), and shortness of breath. Blood in the head. This may cause a headache, weakness, trouble speaking or understanding speech, or passing out. How is this diagnosed? This condition is diagnosed based on: Your medical history. A physical exam. Imaging tests, such as ultrasound or CT scan. Blood tests. How is this treated? Treatment depends on the cause, size, and location of the hematoma. Treatment may include: Doing nothing. Many hematomas go away on their own without treatment. Surgery or close monitoring. This may be needed for large hematomas or hematomas that affect the body's organs. Medicines. These may be given if a medical condition caused the hematoma. Follow these instructions at home: Managing pain, stiffness,  and swelling  If told, put ice on the area. Put ice in a plastic bag. Place a towel between your skin and the bag. Leave the ice on for 20 minutes, 2-3 times a day for the first two days. If told, put heat on the affected area after putting ice on the area for two days. Use the heat source that your doctor tells you to use. This could be a moist heat pack or a heating pad. To do this: Place a towel between your skin and the heat source. Leave the heat on for 20-30 minutes. Remove the heat if your skin turns bright red. This is very important if you are unable to feel pain, heat, or cold. You may have a greater risk of getting burned. Raise (elevate) the affected area above the level of your heart while you are sitting or lying down. Wrap the affected area with an elastic bandage, if told by your doctor. Do not wrap the bandage too tightly. If your hematoma is on a leg or foot and is painful, your doctor may give you crutches. Use them as told by your doctor. General instructions Take over-the-counter and prescription medicines only as told by your doctor. Keep all follow-up visits as told by your doctor. This is important. Contact a doctor if: You have a fever. The swelling or bruising gets worse. You start to get more hematomas. Get help right away if: Your pain gets worse. Your pain is not getting better with medicine. Your skin over the hematoma breaks or starts to bleed.   Your hematoma is in your chest or belly and you: Pass out. Feel weak. Become short of breath. You have a hematoma on your scalp that is caused by a fall or injury, and you: Have a headache that gets worse. Have trouble speaking or understanding speech. Become less alert or you pass out. Summary A hematoma is a collection of blood in any part of your body. Most hematomas get better on their own in a few days to weeks. Some may need medical care. Follow instructions from your doctor about how to care for your  hematoma. Contact a doctor if the swelling or bruising gets worse, or if you are short of breath. This information is not intended to replace advice given to you by your health care provider. Make sure you discuss any questions you have with your health care provider. Document Revised: 05/26/2021 Document Reviewed: 05/26/2021 Elsevier Patient Education  2023 Elsevier Inc.  

## 2022-05-29 NOTE — Progress Notes (Signed)
   Acute Office Visit  Subjective:     Patient ID: Kristie Crawford, female    DOB: 2001-02-20, 21 y.o.   MRN: [id number]  Chief Complaint  Patient presents with   Bleeding/Bruising    3 weeks - random     HPI Patient is in today for bruising and skin with no known cause.  Symptoms have been present intermittently for the past few weeks to months.  Patient is unsure what is causing the symptoms.  She denies fever, headaches, body aches and use of anti-inflammatories or blood thinners.  Patient has no other concerns.  Review of Systems  Constitutional: Negative.  Negative for chills, fever, malaise/fatigue and weight loss.  HENT: Negative.    Eyes: Negative.   Respiratory: Negative.  Negative for sputum production and shortness of breath.   Cardiovascular: Negative.   Genitourinary: Negative.   Skin: Negative.  Negative for itching and rash.  All other systems reviewed and are negative.       Objective:    Temp 98.7 F (37.1 C)   Ht $R'5\' 2"'dE$  (1.575 m)   Wt 181 lb (82.1 kg)   LMP 05/22/2022   SpO2 98%   BMI 33.11 kg/m    Physical Exam Vitals and nursing note reviewed.  Constitutional:      Appearance: Normal appearance.  HENT:     Head: Normocephalic.     Right Ear: External ear normal.     Left Ear: External ear normal.     Nose: Nose normal.     Mouth/Throat:     Mouth: Mucous membranes are moist.     Pharynx: Oropharynx is clear.  Eyes:     Conjunctiva/sclera: Conjunctivae normal.  Cardiovascular:     Rate and Rhythm: Normal rate and regular rhythm.     Pulses: Normal pulses.     Heart sounds: Normal heart sounds.  Pulmonary:     Effort: Pulmonary effort is normal.     Breath sounds: Normal breath sounds.  Abdominal:     General: Bowel sounds are normal.  Skin:    General: Skin is warm.     Findings: No erythema or rash.  Neurological:     Mental Status: She is alert and oriented to person, place, and time.     No results found for any visits on  05/29/22.      Assessment & Plan:  Completed head to toe assessment.  I did not assess any bruising at this time.  Patient reports that bruising appears in skin and disappears after a few seconds probably when her muscle is tensed.  I completed CBC, CMP, B12 and vitamin D to assess deficiencies.  Advised patient to take pictures when next bruising appears and follow-up with worsening unresolved symptoms. Problem List Items Addressed This Visit   None Visit Diagnoses     Need for immunization against influenza    -  Primary   Relevant Orders   Flu Vaccine QUAD 107mo+IM (Fluarix, Fluzone & Alfiuria Quad PF) (Completed)   CBC with Differential   Vitamin B12   CMP14+EGFR   Vitamin D deficiency       Relevant Orders   Vitamin D, 25-hydroxy       No orders of the defined types were placed in this encounter.   Return if symptoms worsen or fail to improve.  Ivy Lynn, NP

## 2022-05-30 ENCOUNTER — Encounter: Payer: Self-pay | Admitting: Nurse Practitioner

## 2022-05-30 LAB — CBC WITH DIFFERENTIAL/PLATELET
Basophils Absolute: 0 10*3/uL (ref 0.0–0.2)
Basos: 1 %
EOS (ABSOLUTE): 0.1 10*3/uL (ref 0.0–0.4)
Eos: 2 %
Hematocrit: 38.4 % (ref 34.0–46.6)
Hemoglobin: 12.5 g/dL (ref 11.1–15.9)
Immature Grans (Abs): 0 10*3/uL (ref 0.0–0.1)
Immature Granulocytes: 0 %
Lymphocytes Absolute: 2.3 10*3/uL (ref 0.7–3.1)
Lymphs: 36 %
MCH: 27.6 pg (ref 26.6–33.0)
MCHC: 32.6 g/dL (ref 31.5–35.7)
MCV: 85 fL (ref 79–97)
Monocytes Absolute: 0.5 10*3/uL (ref 0.1–0.9)
Monocytes: 8 %
Neutrophils Absolute: 3.4 10*3/uL (ref 1.4–7.0)
Neutrophils: 53 %
Platelets: 228 10*3/uL (ref 150–450)
RBC: 4.53 x10E6/uL (ref 3.77–5.28)
RDW: 13.1 % (ref 11.7–15.4)
WBC: 6.4 10*3/uL (ref 3.4–10.8)

## 2022-05-30 LAB — VITAMIN B12: Vitamin B-12: 406 pg/mL (ref 232–1245)

## 2022-05-30 LAB — CMP14+EGFR
ALT: 35 IU/L — ABNORMAL HIGH (ref 0–32)
AST: 25 IU/L (ref 0–40)
Albumin/Globulin Ratio: 2 (ref 1.2–2.2)
Albumin: 4.3 g/dL (ref 4.0–5.0)
Alkaline Phosphatase: 82 IU/L (ref 44–121)
BUN/Creatinine Ratio: 22 (ref 9–23)
BUN: 16 mg/dL (ref 6–20)
Bilirubin Total: <0.2 mg/dL (ref 0.0–1.2)
CO2: 22 mmol/L (ref 20–29)
Calcium: 9.4 mg/dL (ref 8.7–10.2)
Chloride: 104 mmol/L (ref 96–106)
Creatinine, Ser: 0.73 mg/dL (ref 0.57–1.00)
Globulin, Total: 2.2 g/dL (ref 1.5–4.5)
Glucose: 111 mg/dL — ABNORMAL HIGH (ref 70–99)
Potassium: 4.5 mmol/L (ref 3.5–5.2)
Sodium: 138 mmol/L (ref 134–144)
Total Protein: 6.5 g/dL (ref 6.0–8.5)
eGFR: 119 mL/min/{1.73_m2} (ref >59)

## 2022-05-31 ENCOUNTER — Other Ambulatory Visit: Payer: Self-pay | Admitting: Nurse Practitioner

## 2022-05-31 DIAGNOSIS — E559 Vitamin D deficiency, unspecified: Secondary | ICD-10-CM

## 2022-05-31 LAB — SPECIMEN STATUS REPORT

## 2022-05-31 LAB — VITAMIN D 25 HYDROXY (VIT D DEFICIENCY, FRACTURES): Vit D, 25-Hydroxy: 28.7 ng/mL — ABNORMAL LOW (ref 30.0–100.0)

## 2022-05-31 MED ORDER — VITAMIN D3 50 MCG (2000 UT) PO CAPS: 2000.0000 [IU] | ORAL_CAPSULE | Freq: Every day | ORAL | 1 refills | Status: DC

## 2022-06-07 ENCOUNTER — Encounter: Payer: Self-pay | Admitting: Neurology

## 2022-06-07 ENCOUNTER — Institutional Professional Consult (permissible substitution): Payer: Medicaid Other | Admitting: Neurology

## 2022-06-08 ENCOUNTER — Other Ambulatory Visit (HOSPITAL_COMMUNITY): Payer: Self-pay | Admitting: Psychiatry

## 2022-06-08 DIAGNOSIS — F502 Bulimia nervosa: Secondary | ICD-10-CM

## 2022-06-08 DIAGNOSIS — F4001 Agoraphobia with panic disorder: Secondary | ICD-10-CM

## 2022-06-08 DIAGNOSIS — F410 Panic disorder [episodic paroxysmal anxiety] without agoraphobia: Secondary | ICD-10-CM

## 2022-06-08 DIAGNOSIS — F331 Major depressive disorder, recurrent, moderate: Secondary | ICD-10-CM

## 2022-06-12 ENCOUNTER — Telehealth (HOSPITAL_COMMUNITY): Payer: BC Managed Care – PPO | Admitting: Psychiatry

## 2022-06-13 ENCOUNTER — Encounter: Payer: Self-pay | Admitting: Nurse Practitioner

## 2022-06-13 DIAGNOSIS — R109 Unspecified abdominal pain: Secondary | ICD-10-CM | POA: Diagnosis not present

## 2022-06-13 DIAGNOSIS — N398 Other specified disorders of urinary system: Secondary | ICD-10-CM | POA: Diagnosis not present

## 2022-06-13 DIAGNOSIS — R300 Dysuria: Secondary | ICD-10-CM | POA: Diagnosis not present

## 2022-06-15 ENCOUNTER — Encounter (HOSPITAL_COMMUNITY): Payer: Self-pay | Admitting: Psychiatry

## 2022-06-15 ENCOUNTER — Telehealth (INDEPENDENT_AMBULATORY_CARE_PROVIDER_SITE_OTHER): Payer: BC Managed Care – PPO | Admitting: Psychiatry

## 2022-06-15 DIAGNOSIS — F411 Generalized anxiety disorder: Secondary | ICD-10-CM

## 2022-06-15 DIAGNOSIS — F4001 Agoraphobia with panic disorder: Secondary | ICD-10-CM | POA: Diagnosis not present

## 2022-06-15 DIAGNOSIS — F4310 Post-traumatic stress disorder, unspecified: Secondary | ICD-10-CM

## 2022-06-15 DIAGNOSIS — F502 Bulimia nervosa: Secondary | ICD-10-CM

## 2022-06-15 DIAGNOSIS — K5900 Constipation, unspecified: Secondary | ICD-10-CM | POA: Insufficient documentation

## 2022-06-15 DIAGNOSIS — F331 Major depressive disorder, recurrent, moderate: Secondary | ICD-10-CM

## 2022-06-15 DIAGNOSIS — F410 Panic disorder [episodic paroxysmal anxiety] without agoraphobia: Secondary | ICD-10-CM

## 2022-06-15 MED ORDER — MIRTAZAPINE 30 MG PO TABS: 30.0000 mg | ORAL_TABLET | Freq: Every day | ORAL | 1 refills | Status: DC

## 2022-06-15 NOTE — Patient Instructions (Signed)
We increased your remeron to 30mg  nightly. Keep up the good work with reducing exercise after meal times. I will reach out to your PCP to check for constipation on your CT scan. In the meantime, it is ok to use miralax to help soften your stool.
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
## 2022-06-18 IMAGING — CR DG CERVICAL SPINE COMPLETE 4+V
5 series · 5 of 5 positions shown · non-contrast
Comparison: CT 09/24/2019

CLINICAL DATA: Fell out of bed

EXAM:
CERVICAL SPINE - COMPLETE 4+ VIEW

[w c-spine odontoid]
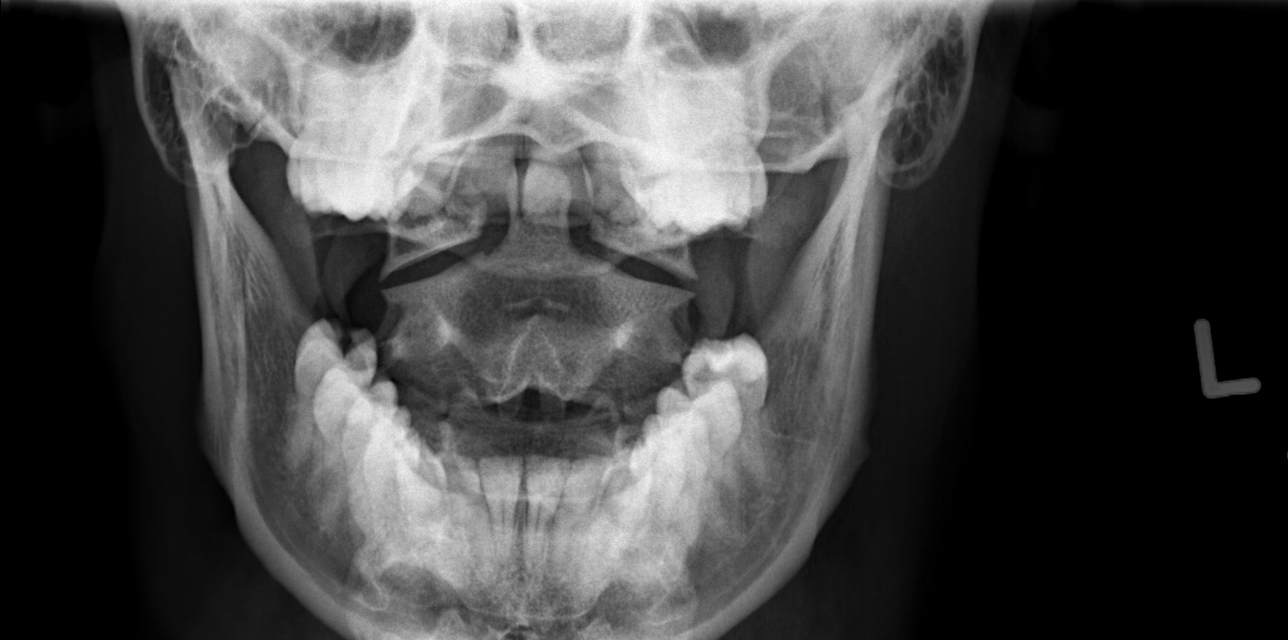

[w c-spine a.p.]
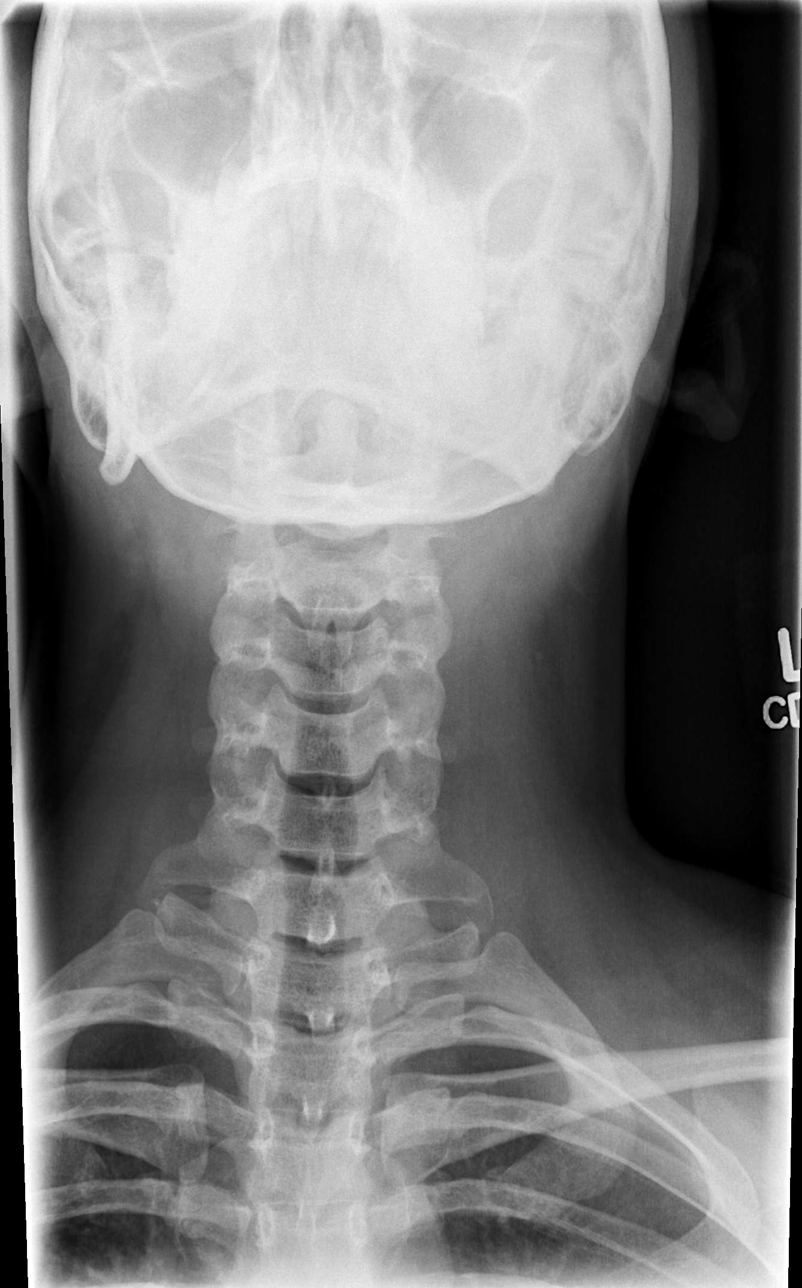

[w c-spine oblique (1 of 2)]
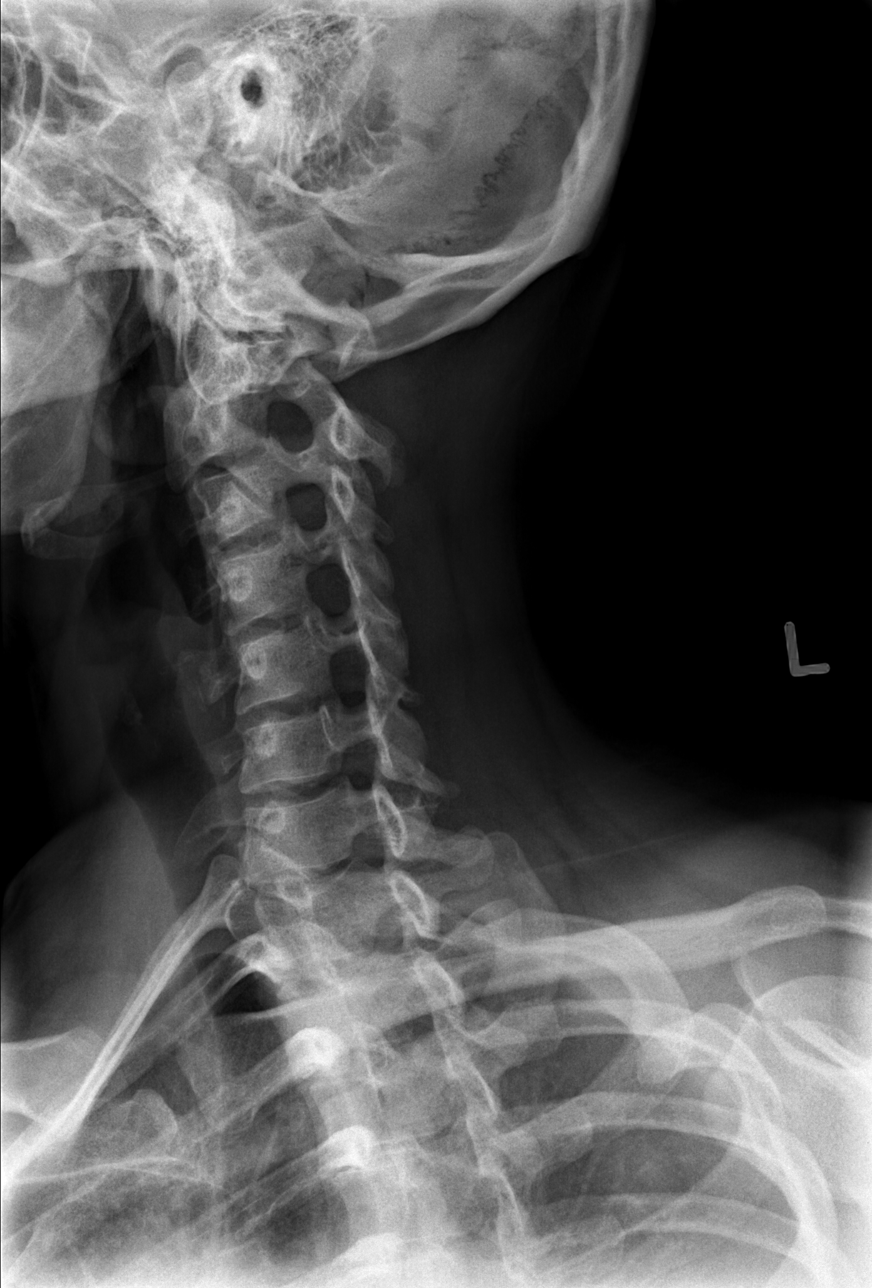

[w c-spine oblique (2 of 2)]
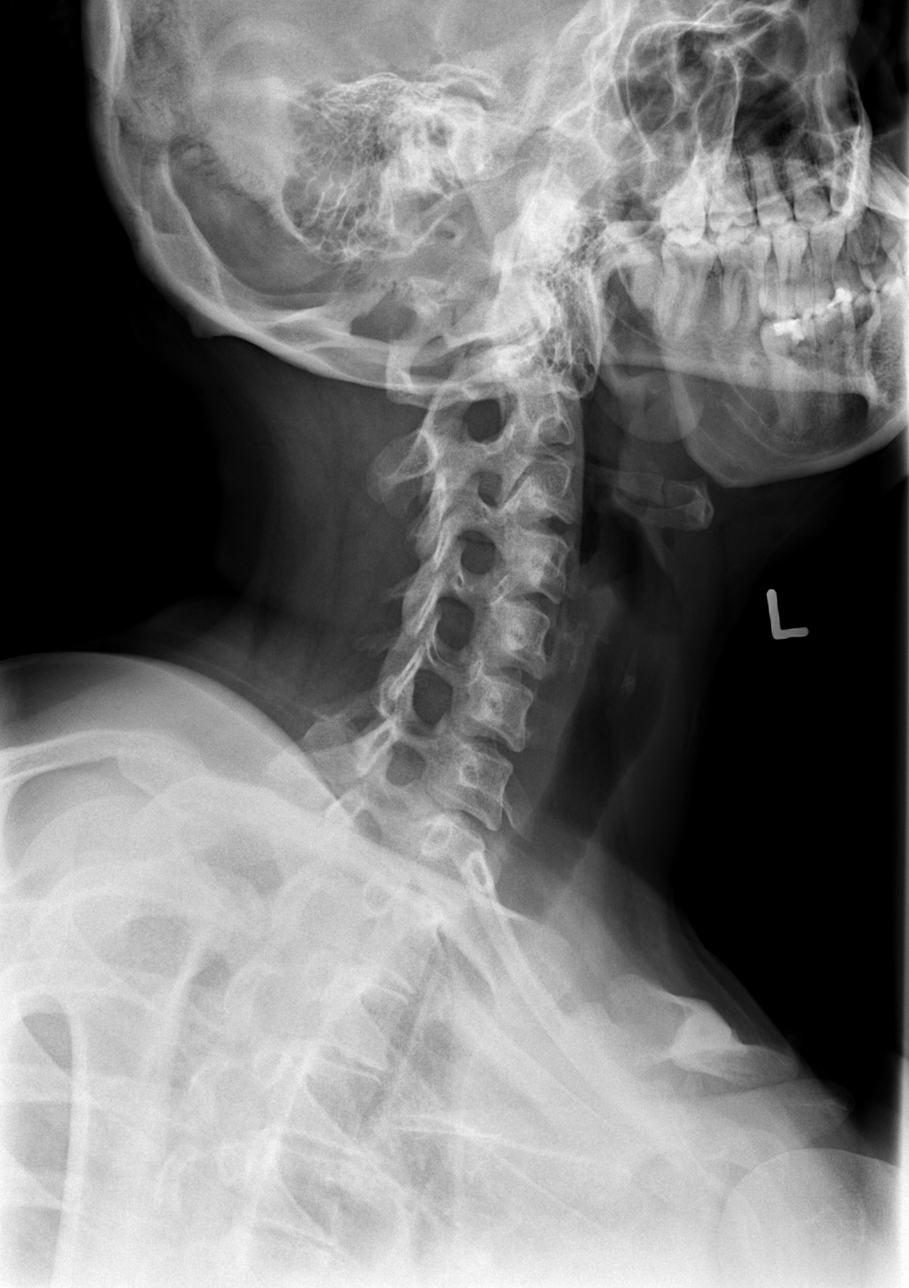

[w c-spine lat]
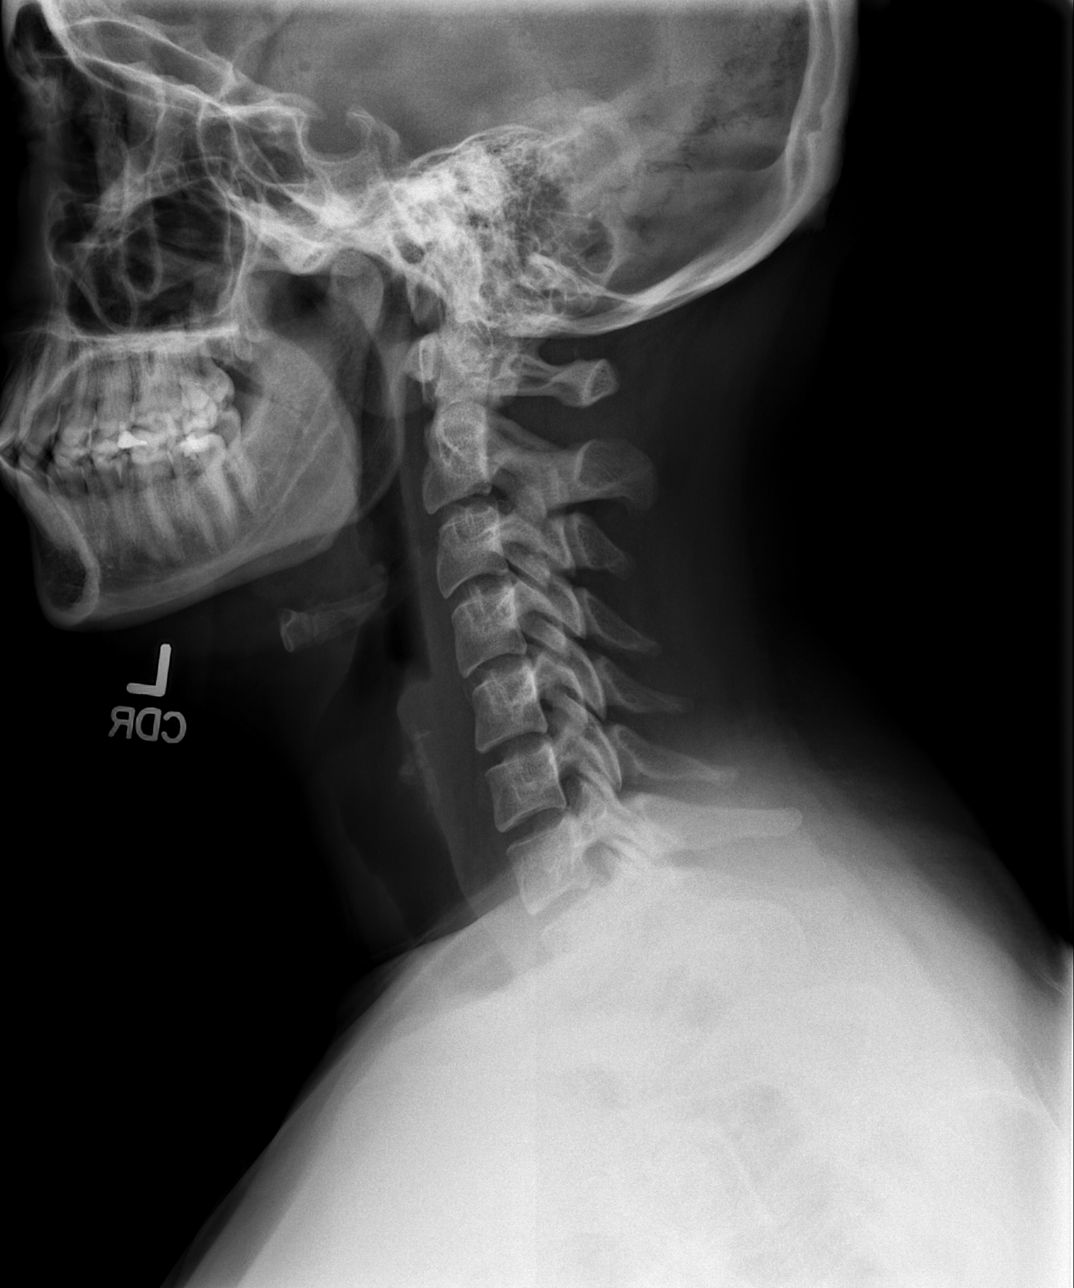

[5 of 5 positions shown; findings below may reference images not displayed]

FINDINGS: Dens and lateral masses are within normal limits. Straightening of
the cervical spine. Vertebral body heights and disc spaces appear
within normal limits.
IMPRESSION: Straightening of the cervical spine.  No acute osseous abnormality

## 2022-06-18 IMAGING — CR DG THORACIC SPINE 2V
2 series · 2 of 2 positions shown · non-contrast
Comparison: None.

CLINICAL DATA: Injury pain

EXAM:
THORACIC SPINE 2 VIEWS

[w t-spine lat *]
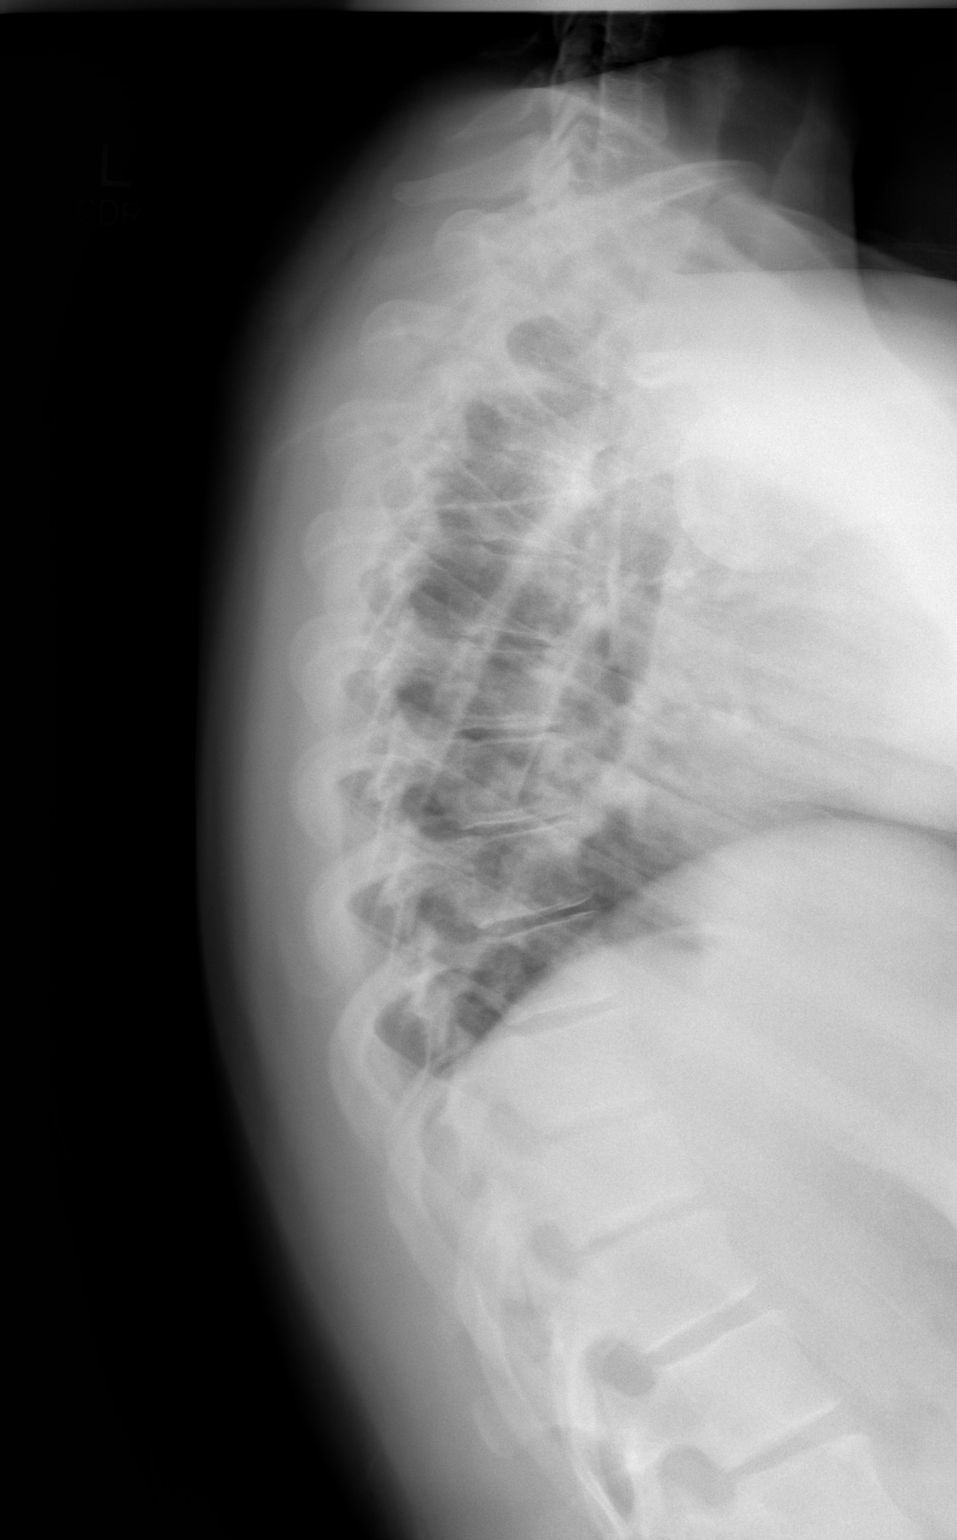

[w t-spine a.p. *]
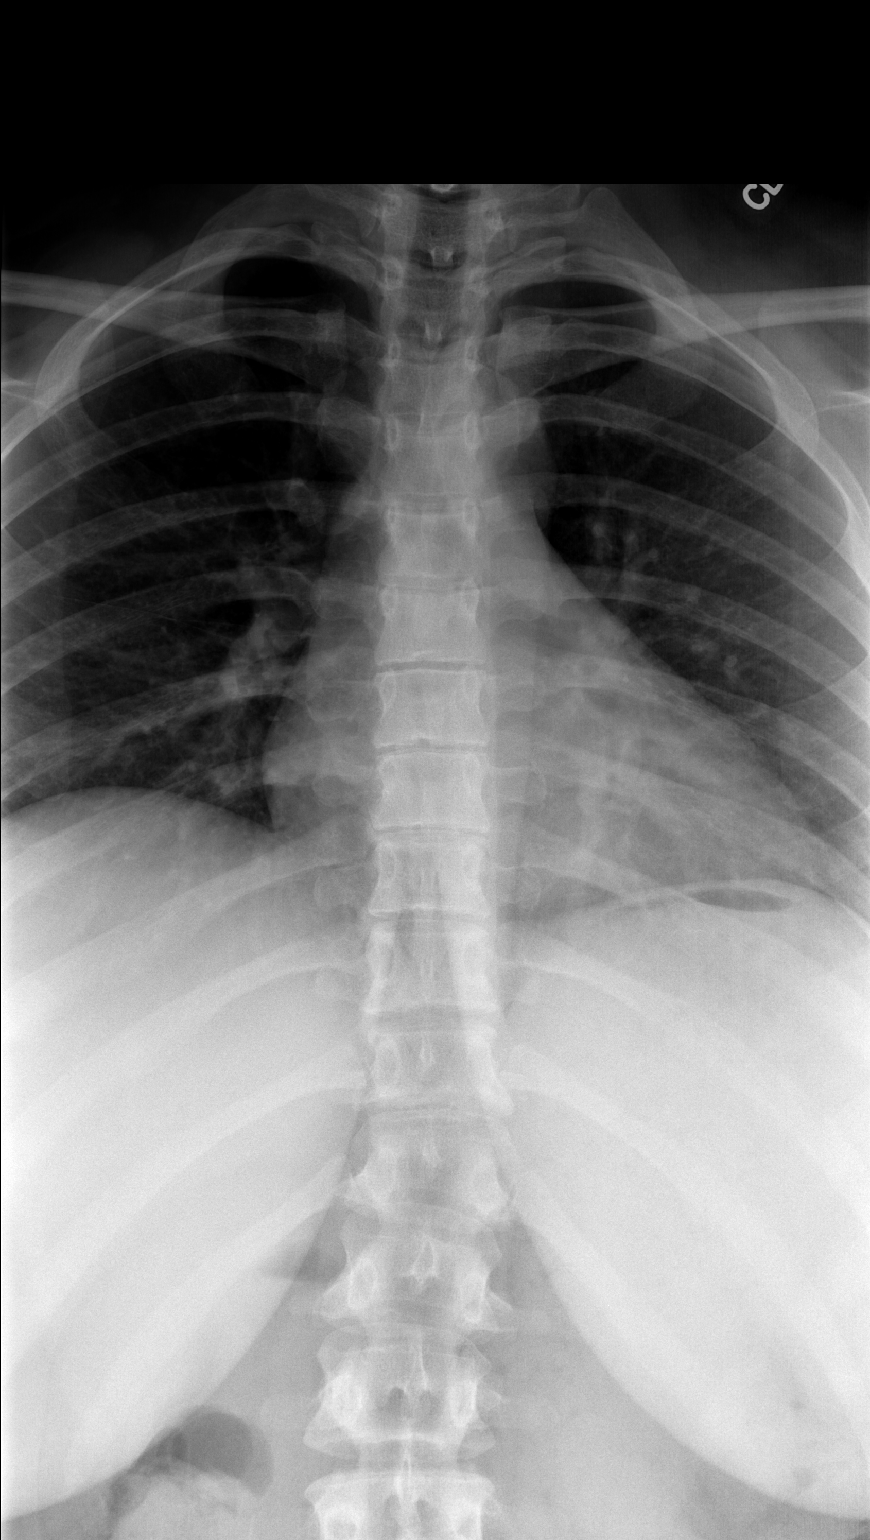

[2 of 2 positions shown; findings below may reference images not displayed]

FINDINGS: Minimal scoliosis. Vertebral body heights and disc spaces appear
within normal limits
IMPRESSION: No acute osseous abnormality

## 2022-06-26 ENCOUNTER — Telehealth: Payer: BC Managed Care – PPO | Admitting: Nurse Practitioner

## 2022-06-27 ENCOUNTER — Encounter: Payer: Self-pay | Admitting: Nurse Practitioner

## 2022-06-27 ENCOUNTER — Ambulatory Visit (INDEPENDENT_AMBULATORY_CARE_PROVIDER_SITE_OTHER): Payer: BC Managed Care – PPO | Admitting: Nurse Practitioner

## 2022-06-27 DIAGNOSIS — R300 Dysuria: Secondary | ICD-10-CM | POA: Diagnosis not present

## 2022-06-27 DIAGNOSIS — R1084 Generalized abdominal pain: Secondary | ICD-10-CM | POA: Diagnosis not present

## 2022-06-27 NOTE — Progress Notes (Signed)
   Virtual Visit  Note Due to COVID-19 pandemic this visit was conducted virtually. This visit type was conducted due to national recommendations for restrictions regarding the COVID-19 Pandemic (e.g. social distancing, sheltering in place) in an effort to limit this patient's exposure and mitigate transmission in our community. All issues noted in this document were discussed and addressed.  A physical exam was not performed with this format.  I connected with Kristie Crawford on 06/27/22 at 9:00 am  by telephone and verified that I am speaking with the correct person using two identifiers. Kristie Crawford is currently located at home during visit. The provider, Daryll Drown, NP is located in their office at time of visit.  I discussed the limitations, risks, security and privacy concerns of performing an evaluation and management service by telephone and the availability of in person appointments. I also discussed with the patient that there may be a patient responsible charge related to this service. The patient expressed understanding and agreed to proceed.   History and Present Illness:  Dysuria  This is a chronic problem. The current episode started more than 1 month ago. The problem occurs intermittently. The problem has been unchanged. The quality of the pain is described as burning and aching. The pain is mild. There has been no fever. Pertinent negatives include no chills, frequency or hematuria. She has tried antibiotics for the symptoms. The treatment provided mild relief. Her past medical history is significant for recurrent UTIs.      Review of Systems  Constitutional: Negative.  Negative for chills and fever.  HENT: Negative.    Eyes: Negative.   Respiratory: Negative.    Cardiovascular: Negative.   Gastrointestinal: Negative.   Genitourinary:  Positive for dysuria. Negative for frequency and hematuria.  Musculoskeletal: Negative.   Skin: Negative.  Negative for  itching and rash.  All other systems reviewed and are negative.    Observations/Objective: Tele Visit, patient is not in distress  Assessment and Plan: Patient has frequent UTI and was recently assessed [facility] urologist and was advised to go back to PCP to complete a CT scan to rule out kidney stones causing her frequent UTI symptoms. Patient is a febrile today, mild dysuria, she is taking cranberry pills and increasing hydration . Referral completed. Patient notified and will follow up appropriately AZO OTC as needed for UTI symptoms  Follow Up Instructions: Follow up with unresolved symptoms    I discussed the assessment and treatment plan with the patient. The patient was provided an opportunity to ask questions and all were answered. The patient agreed with the plan and demonstrated an understanding of the instructions.   The patient was advised to call back or seek an in-person evaluation if the symptoms worsen or if the condition fails to improve as anticipated.  The above assessment and management plan was discussed with the patient. The patient verbalized understanding of and has agreed to the management plan. Patient is aware to call the clinic if symptoms persist or worsen. Patient is aware when to return to the clinic for a follow-up visit. Patient educated on when it is appropriate to go to the emergency department.   Time call ended:  9:09 am   I provided 9 minutes of  non face-to-face time during this encounter.    Daryll Drown, NP

## 2022-07-05 ENCOUNTER — Ambulatory Visit: Payer: BC Managed Care – PPO | Admitting: Family Medicine

## 2022-07-09 ENCOUNTER — Other Ambulatory Visit (HOSPITAL_COMMUNITY): Payer: Self-pay | Admitting: Psychiatry

## 2022-07-09 DIAGNOSIS — F4001 Agoraphobia with panic disorder: Secondary | ICD-10-CM

## 2022-07-09 DIAGNOSIS — F331 Major depressive disorder, recurrent, moderate: Secondary | ICD-10-CM

## 2022-07-09 DIAGNOSIS — F410 Panic disorder [episodic paroxysmal anxiety] without agoraphobia: Secondary | ICD-10-CM

## 2022-07-09 DIAGNOSIS — F502 Bulimia nervosa: Secondary | ICD-10-CM

## 2022-07-17 ENCOUNTER — Telehealth (INDEPENDENT_AMBULATORY_CARE_PROVIDER_SITE_OTHER): Payer: BC Managed Care – PPO | Admitting: Psychiatry

## 2022-07-17 ENCOUNTER — Encounter (HOSPITAL_COMMUNITY): Payer: Self-pay | Admitting: Psychiatry

## 2022-07-17 DIAGNOSIS — F411 Generalized anxiety disorder: Secondary | ICD-10-CM

## 2022-07-17 DIAGNOSIS — F4310 Post-traumatic stress disorder, unspecified: Secondary | ICD-10-CM

## 2022-07-17 DIAGNOSIS — F502 Bulimia nervosa, unspecified: Secondary | ICD-10-CM

## 2022-07-17 DIAGNOSIS — F4001 Agoraphobia with panic disorder: Secondary | ICD-10-CM | POA: Diagnosis not present

## 2022-07-17 DIAGNOSIS — F331 Major depressive disorder, recurrent, moderate: Secondary | ICD-10-CM

## 2022-07-17 DIAGNOSIS — Z9189 Other specified personal risk factors, not elsewhere classified: Secondary | ICD-10-CM

## 2022-07-17 DIAGNOSIS — F410 Panic disorder [episodic paroxysmal anxiety] without agoraphobia: Secondary | ICD-10-CM

## 2022-07-17 MED ORDER — MIRTAZAPINE 45 MG PO TABS: 45.0000 mg | ORAL_TABLET | Freq: Every day | ORAL | 1 refills | Status: DC
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
## 2022-07-17 NOTE — Patient Instructions (Addendum)
We increased your remeron to 45mg  today. Take this nightly before bed. I'm including a manual for binge eating because that is primary behavior we are seeing right now but the diagnosis is still bulimia nervosa. Check this out: 

## 2022-08-03 ENCOUNTER — Encounter (HOSPITAL_COMMUNITY): Payer: Self-pay

## 2022-08-08 ENCOUNTER — Other Ambulatory Visit (HOSPITAL_COMMUNITY): Payer: Self-pay | Admitting: Psychiatry

## 2022-08-08 DIAGNOSIS — F331 Major depressive disorder, recurrent, moderate: Secondary | ICD-10-CM

## 2022-08-08 DIAGNOSIS — F502 Bulimia nervosa: Secondary | ICD-10-CM

## 2022-08-08 DIAGNOSIS — F4001 Agoraphobia with panic disorder: Secondary | ICD-10-CM

## 2022-08-08 DIAGNOSIS — F411 Generalized anxiety disorder: Secondary | ICD-10-CM

## 2022-08-08 NOTE — Telephone Encounter (Signed)
Called pt in regards to release forms no answer vm full

## 2022-08-17 NOTE — Telephone Encounter (Signed)
Called pt no answer/ vm full  ?

## 2022-08-22 DIAGNOSIS — R059 Cough, unspecified: Secondary | ICD-10-CM | POA: Diagnosis not present

## 2022-08-22 DIAGNOSIS — J00 Acute nasopharyngitis [common cold]: Secondary | ICD-10-CM | POA: Diagnosis not present

## 2022-08-24 ENCOUNTER — Ambulatory Visit (HOSPITAL_COMMUNITY): Payer: BC Managed Care – PPO | Admitting: Psychiatry

## 2022-09-05 ENCOUNTER — Ambulatory Visit (INDEPENDENT_AMBULATORY_CARE_PROVIDER_SITE_OTHER): Payer: BC Managed Care – PPO | Admitting: Psychiatry

## 2022-09-05 ENCOUNTER — Encounter (HOSPITAL_COMMUNITY): Payer: Self-pay | Admitting: Psychiatry

## 2022-09-05 DIAGNOSIS — F4001 Agoraphobia with panic disorder: Secondary | ICD-10-CM

## 2022-09-05 DIAGNOSIS — F331 Major depressive disorder, recurrent, moderate: Secondary | ICD-10-CM | POA: Diagnosis not present

## 2022-09-05 DIAGNOSIS — F502 Bulimia nervosa: Secondary | ICD-10-CM | POA: Diagnosis not present

## 2022-09-05 DIAGNOSIS — F4310 Post-traumatic stress disorder, unspecified: Secondary | ICD-10-CM

## 2022-09-05 DIAGNOSIS — F411 Generalized anxiety disorder: Secondary | ICD-10-CM | POA: Diagnosis not present

## 2022-09-05 MED ORDER — MIRTAZAPINE 45 MG PO TABS: 45.0000 mg | ORAL_TABLET | Freq: Every day | ORAL | 2 refills | Status: DC

## 2022-09-05 NOTE — Patient Instructions (Signed)
We did not make any medication changes today.  Keep up the good work with not purging after meals and going to psychotherapy.
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
## 2022-10-23 ENCOUNTER — Encounter (HOSPITAL_COMMUNITY): Payer: Self-pay | Admitting: Psychiatry

## 2022-10-23 ENCOUNTER — Telehealth (INDEPENDENT_AMBULATORY_CARE_PROVIDER_SITE_OTHER): Payer: BC Managed Care – PPO | Admitting: Psychiatry

## 2022-10-23 DIAGNOSIS — F4310 Post-traumatic stress disorder, unspecified: Secondary | ICD-10-CM

## 2022-10-23 DIAGNOSIS — F4001 Agoraphobia with panic disorder: Secondary | ICD-10-CM | POA: Diagnosis not present

## 2022-10-23 DIAGNOSIS — F502 Bulimia nervosa: Secondary | ICD-10-CM | POA: Diagnosis not present

## 2022-10-23 DIAGNOSIS — R41840 Attention and concentration deficit: Secondary | ICD-10-CM

## 2022-10-23 DIAGNOSIS — F410 Panic disorder [episodic paroxysmal anxiety] without agoraphobia: Secondary | ICD-10-CM

## 2022-10-23 DIAGNOSIS — F331 Major depressive disorder, recurrent, moderate: Secondary | ICD-10-CM | POA: Diagnosis not present

## 2022-10-23 DIAGNOSIS — F411 Generalized anxiety disorder: Secondary | ICD-10-CM

## 2022-10-23 MED ORDER — MIRTAZAPINE 45 MG PO TABS: 45.0000 mg | ORAL_TABLET | Freq: Every day | ORAL | 2 refills | Status: DC

## 2022-10-23 MED ORDER — ATOMOXETINE HCL 40 MG PO CAPS: 40.0000 mg | ORAL_CAPSULE | Freq: Every day | ORAL | 2 refills | Status: DC

## 2022-10-23 NOTE — Patient Instructions (Addendum)
We started Strattera 40 mg once daily today.  This should help with inattention but please do reach out to these clinics to see who could see you soonest for formal neuropsychiatric testing: Jani Files, NP at [place] [phone]) or [place] [phone]) or Judithann Sauger at [facility]. [place] (part of [facility]) [phone] has multiple providers. [place] [phone] has several providers specializing in ADHD/Bipolar= Jillene Bucks, PhD is expert at Adult ADHD, Darryl Nestle, PhD treats adults with both diagnoses.
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
## 2022-11-05 ENCOUNTER — Emergency Department (HOSPITAL_COMMUNITY)
Admission: EM | Admit: 2022-11-05 | Discharge: 2022-11-05 | Discharge status: Against Medical Advice | Payer: BC Managed Care – PPO | Source: Home / Self Care

## 2022-11-28 ENCOUNTER — Ambulatory Visit (INDEPENDENT_AMBULATORY_CARE_PROVIDER_SITE_OTHER): Payer: BC Managed Care – PPO | Admitting: Family Medicine

## 2022-11-28 ENCOUNTER — Ambulatory Visit: Payer: BC Managed Care – PPO | Admitting: Family Medicine

## 2022-11-28 ENCOUNTER — Telehealth (HOSPITAL_COMMUNITY): Payer: BC Managed Care – PPO | Admitting: Psychiatry

## 2022-11-28 VITALS — BP 115/70 | HR 70 | Temp 99.0°F | Ht 62.0 in | Wt 196.0 lb

## 2022-11-28 DIAGNOSIS — Z79899 Other long term (current) drug therapy: Secondary | ICD-10-CM

## 2022-11-28 DIAGNOSIS — R41840 Attention and concentration deficit: Secondary | ICD-10-CM | POA: Diagnosis not present

## 2022-11-28 DIAGNOSIS — R7309 Other abnormal glucose: Secondary | ICD-10-CM

## 2022-11-28 DIAGNOSIS — F339 Major depressive disorder, recurrent, unspecified: Secondary | ICD-10-CM

## 2022-11-28 DIAGNOSIS — E559 Vitamin D deficiency, unspecified: Secondary | ICD-10-CM | POA: Diagnosis not present

## 2022-11-28 DIAGNOSIS — F419 Anxiety disorder, unspecified: Secondary | ICD-10-CM | POA: Diagnosis not present

## 2022-11-28 DIAGNOSIS — Z136 Encounter for screening for cardiovascular disorders: Secondary | ICD-10-CM

## 2022-11-28 NOTE — Progress Notes (Signed)
Acute Office Visit  Subjective:  Patient ID: Kristie Crawford, female    DOB: July 29, 2001, 22 y.o.   MRN: [id number]  Chief Complaint  Patient presents with   Establish Care    Discuss ADHD   HPI Patient is in today to follow up for ADHD. She is following with [facility] and they are trying to get her established with psychiatry, but the appointment times are not available until the fall. She was recently started on Strattera by [facility], however it is a telehealth service and she needs to be evaluated by psychiatry.  When she started strattera she noticed a decreased appetite and nausea for the first 2 weeks. In the 3rd week she noticed a slight increase in her engagement with activities, but this is short lived, lasting 2-3 hours. Endorses that it helps, but it is not helping fully, so she would like to stay on it. For now, it is being managed by Dr. Adrian Blackwater with [facility]. She has follow up with Dr. Adrian Blackwater next week. And has a refill available for pick up. Denies insomnia. Denies SI.   She also would like her Vitamin D rechecked because she has not had that rechecked in a while and she has been taking a daily supplement.   ROS As per HPI  Objective:  BP 115/70   Pulse 70   Temp 99 F (37.2 C)   Ht  (1.575 m)   Wt 196 lb (88.9 kg)   LMP 11/01/2022 (Exact Date)   SpO2 96%   BMI 35.85 kg/m   Physical Exam Constitutional:      General: She is not in acute distress.    Appearance: Normal appearance. She is not ill-appearing, toxic-appearing or diaphoretic.  Cardiovascular:     Rate and Rhythm: Normal rate.     Pulses: Normal pulses.     Heart sounds: Normal heart sounds. No murmur heard.    No gallop.  Pulmonary:     Effort: Pulmonary effort is normal. No respiratory distress.     Breath sounds: Normal breath sounds. No stridor. No wheezing, rhonchi or rales.  Skin:    General: Skin is warm.     Capillary Refill: Capillary refill takes less than 2 seconds.  Neurological:     General:  No focal deficit present.     Mental Status: She is alert and oriented to person, place, and time. Mental status is at baseline.     Motor: No weakness.  Psychiatric:        Attention and Perception: Attention and perception normal.        Mood and Affect: Mood and affect normal.        Behavior: Behavior normal. Behavior is cooperative.        Thought Content: Thought content normal.        Cognition and Memory: Cognition and memory normal.        Judgment: Judgment normal.       11/28/2022   10:29 AM 05/29/2022    9:59 AM 05/03/2022   10:17 AM  Depression screen PHQ 2/9  Decreased Interest Down, Depressed, Hopeless 0 1 3  PHQ - 2 Score Altered sleeping 0 0 3  Tired, decreased energy 1 1 0  Change in appetite Feeling bad or failure about yourself  0 1 3  Trouble concentrating 3 0 3  Moving slowly or fidgety/restless 2 0 3  Suicidal thoughts 0  0 0  PHQ-9 Score Difficult doing work/chores Extremely dIfficult Not difficult at all Very difficult      11/28/2022   10:29 AM 05/29/2022   10:00 AM 03/01/2022    2:25 PM  GAD 7 : Generalized Anxiety Score  Nervous, Anxious, on Edge 0 1 3  Control/stop worrying 0 1 3  Worry too much - different things 0 2 3  Trouble relaxing Restless 0 1 3  Easily annoyed or irritable 0 0 0  Afraid - awful might happen 0 1 3  Total GAD 7 Score Anxiety Difficulty Not difficult at all Not difficult at all Extremely difficult   Assessment & Plan:  1. Inattention Referral placed as below for evaluation. Patient is currently being treated for inattention with Strattera, unsure if she will need to pause treatment for evaluation.  Discussed with patient to continue to see [facility] for her Strattera refills and counseling from [facility]. Denies SI today on screening. Instructed patient to follow up in one month.  - Ambulatory referral to Psychiatry  2. Encounter for screening for cardiovascular disorders Labs as below.  Will communicate results to patient once available.  Not fasting. Instructed patient to return when she is fasting.  - Lipid panel; Future  3. Vitamin D deficiency Labs as below. Will communicate results to patient once available.  Patient is going to complete all labs while fasting.  - VITAMIN D 25 Hydroxy (Vit-D Deficiency, Fractures); Future  4. Depression, recurrent 5. Anxiety Labs as below. Will communicate results to patient once available.  Will complete all labs while fasting.  Managed by [facility]. Referral placed as above for psych.  - CBC with Differential/Platelet; Future - CMP14+EGFR; Future  6. [place]-term current use of anxiolytic medication Labs as below. Will communicate results to patient once available.  Plans to get all labs completed while fasting.  - Vitamin B12; Future - TSH; Future  7. Elevated glucose Labs as below. Will communicate results to patient once available.  Plans to get all labs completed while fasting.  - Bayer DCA Hb A1c Waived; Future  The above assessment and management plan was discussed with the patient. The patient verbalized understanding of and has agreed to the management plan using shared-decision making. Patient is aware to call the clinic if they develop any new symptoms or if symptoms fail to improve or worsen. Patient is aware when to return to the clinic for a follow-up visit. Patient educated on when it is appropriate to go to the emergency department.   Neale Burly, DNP-FNP [facility] [address] [place], [place] [address] [phone]

## 2022-12-06 ENCOUNTER — Encounter (HOSPITAL_COMMUNITY): Payer: Self-pay | Admitting: Psychiatry

## 2022-12-06 ENCOUNTER — Telehealth (HOSPITAL_COMMUNITY): Payer: BC Managed Care – PPO | Admitting: Psychiatry

## 2022-12-06 ENCOUNTER — Telehealth (HOSPITAL_COMMUNITY): Payer: Self-pay | Admitting: *Deleted

## 2022-12-06 DIAGNOSIS — F4310 Post-traumatic stress disorder, unspecified: Secondary | ICD-10-CM

## 2022-12-06 DIAGNOSIS — F502 Bulimia nervosa: Secondary | ICD-10-CM | POA: Diagnosis not present

## 2022-12-06 DIAGNOSIS — F4001 Agoraphobia with panic disorder: Secondary | ICD-10-CM

## 2022-12-06 DIAGNOSIS — R41840 Attention and concentration deficit: Secondary | ICD-10-CM

## 2022-12-06 MED ORDER — ATOMOXETINE HCL 60 MG PO CAPS
60.0000 mg | ORAL_CAPSULE | Freq: Every day | ORAL | 2 refills | Status: DC
Start: 2022-12-06 — End: 2023-01-11
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
## 2022-12-06 NOTE — Telephone Encounter (Signed)
Called patient to sch appt with Dr. Adrian Blackwater. Staff [facility] for patient to call office

## 2022-12-06 NOTE — Patient Instructions (Signed)
We increased the Strattera (atomoxetine) to 60 mg once daily today.  Keep up the good work with consistent meals and going to your therapy appointments.

## 2022-12-07 ENCOUNTER — Other Ambulatory Visit: Payer: BC Managed Care – PPO

## 2022-12-12 ENCOUNTER — Other Ambulatory Visit: Payer: BC Managed Care – PPO

## 2022-12-12 DIAGNOSIS — Z79899 Other long term (current) drug therapy: Secondary | ICD-10-CM

## 2022-12-12 DIAGNOSIS — Z136 Encounter for screening for cardiovascular disorders: Secondary | ICD-10-CM

## 2022-12-12 DIAGNOSIS — E559 Vitamin D deficiency, unspecified: Secondary | ICD-10-CM | POA: Diagnosis not present

## 2022-12-12 DIAGNOSIS — F339 Major depressive disorder, recurrent, unspecified: Secondary | ICD-10-CM

## 2022-12-12 DIAGNOSIS — R7309 Other abnormal glucose: Secondary | ICD-10-CM

## 2022-12-12 LAB — CBC WITH DIFFERENTIAL/PLATELET
Basophils Absolute: 0 10*3/uL (ref 0.0–0.2)
Basos: 0 %
EOS (ABSOLUTE): 0.2 10*3/uL (ref 0.0–0.4)
Eos: 2 %
Hematocrit: 36.9 % (ref 34.0–46.6)
Lymphs: 44 %
Monocytes Absolute: 0.6 10*3/uL (ref 0.1–0.9)
Neutrophils Absolute: 3.2 10*3/uL (ref 1.4–7.0)

## 2022-12-12 LAB — CMP14+EGFR
ALT: 12 IU/L (ref 0–32)
BUN: 13 mg/dL (ref 6–20)
Bilirubin Total: 0.3 mg/dL (ref 0.0–1.2)
Calcium: 9 mg/dL (ref 8.7–10.2)
Globulin, Total: 2 g/dL (ref 1.5–4.5)
Potassium: 4 mmol/L (ref 3.5–5.2)
Sodium: 142 mmol/L (ref 134–144)
Total Protein: 6 g/dL (ref 6.0–8.5)
eGFR: 126 mL/min/{1.73_m2} (ref >59)

## 2022-12-12 LAB — LIPID PANEL
Cholesterol, Total: 162 mg/dL (ref 100–199)
HDL: 45 mg/dL (ref >39)

## 2022-12-12 LAB — BAYER DCA HB A1C WAIVED: HB A1C (BAYER DCA - WAIVED): 5.1 % (ref 4.8–5.6)

## 2022-12-12 LAB — VITAMIN B12

## 2022-12-13 LAB — CBC WITH DIFFERENTIAL/PLATELET
Hemoglobin: 11.5 g/dL (ref 11.1–15.9)
Immature Grans (Abs): 0 10*3/uL (ref 0.0–0.1)
Immature Granulocytes: 0 %
Lymphocytes Absolute: 3.1 10*3/uL (ref 0.7–3.1)
MCH: 25.8 pg — ABNORMAL LOW (ref 26.6–33.0)
MCHC: 31.2 g/dL — ABNORMAL LOW (ref 31.5–35.7)
MCV: 83 fL (ref 79–97)
Monocytes: 8 %
Neutrophils: 46 %
Platelets: 235 10*3/uL (ref 150–450)
RBC: 4.46 x10E6/uL (ref 3.77–5.28)
RDW: 14.6 % (ref 11.7–15.4)
WBC: 7.1 10*3/uL (ref 3.4–10.8)

## 2022-12-13 LAB — CMP14+EGFR
AST: 14 IU/L (ref 0–40)
Albumin/Globulin Ratio: 2 (ref 1.2–2.2)
Albumin: 4 g/dL (ref 4.0–5.0)
Alkaline Phosphatase: 75 IU/L (ref 44–121)
BUN/Creatinine Ratio: 20 (ref 9–23)
CO2: 21 mmol/L (ref 20–29)
Chloride: 106 mmol/L (ref 96–106)
Creatinine, Ser: 0.66 mg/dL (ref 0.57–1.00)
Glucose: 85 mg/dL (ref 70–99)

## 2022-12-13 LAB — LIPID PANEL
Chol/HDL Ratio: 3.6 ratio (ref 0.0–4.4)
LDL Chol Calc (NIH): 93 mg/dL (ref 0–99)
Triglycerides: 133 mg/dL (ref 0–149)
VLDL Cholesterol Cal: 24 mg/dL (ref 5–40)

## 2022-12-13 LAB — TSH: TSH: 2.53 u[IU]/mL (ref 0.450–4.500)

## 2022-12-13 LAB — VITAMIN D 25 HYDROXY (VIT D DEFICIENCY, FRACTURES): Vit D, 25-Hydroxy: 29.1 ng/mL — ABNORMAL LOW (ref 30.0–100.0)

## 2022-12-13 MED ORDER — VITAMIN D (ERGOCALCIFEROL) 1.25 MG (50000 UNIT) PO CAPS: 50000.0000 [IU] | ORAL_CAPSULE | ORAL | 0 refills | Status: DC

## 2023-01-02 ENCOUNTER — Encounter: Payer: Self-pay | Admitting: Family Medicine

## 2023-01-02 ENCOUNTER — Encounter (HOSPITAL_COMMUNITY): Payer: Self-pay

## 2023-01-11 ENCOUNTER — Encounter (HOSPITAL_COMMUNITY): Payer: Self-pay | Admitting: Psychiatry

## 2023-01-11 ENCOUNTER — Telehealth (HOSPITAL_COMMUNITY): Payer: BC Managed Care – PPO | Admitting: Psychiatry

## 2023-01-11 DIAGNOSIS — F411 Generalized anxiety disorder: Secondary | ICD-10-CM | POA: Diagnosis not present

## 2023-01-11 DIAGNOSIS — F4310 Post-traumatic stress disorder, unspecified: Secondary | ICD-10-CM

## 2023-01-11 DIAGNOSIS — F331 Major depressive disorder, recurrent, moderate: Secondary | ICD-10-CM | POA: Diagnosis not present

## 2023-01-11 DIAGNOSIS — F502 Bulimia nervosa: Secondary | ICD-10-CM

## 2023-01-11 DIAGNOSIS — F4001 Agoraphobia with panic disorder: Secondary | ICD-10-CM | POA: Diagnosis not present

## 2023-01-11 DIAGNOSIS — R41840 Attention and concentration deficit: Secondary | ICD-10-CM

## 2023-01-11 DIAGNOSIS — F410 Panic disorder [episodic paroxysmal anxiety] without agoraphobia: Secondary | ICD-10-CM

## 2023-01-11 MED ORDER — MIRTAZAPINE 45 MG PO TABS
45.0000 mg | ORAL_TABLET | Freq: Every day | ORAL | 0 refills | Status: DC
Start: 2023-01-11 — End: 2023-04-03

## 2023-01-11 MED ORDER — ATOMOXETINE HCL 60 MG PO CAPS
60.0000 mg | ORAL_CAPSULE | Freq: Every day | ORAL | 0 refills | Status: DC
Start: 2023-01-11 — End: 2023-04-03
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
## 2023-01-11 NOTE — Patient Instructions (Signed)
We did not make any medication changes today.  Keep up the good work with having consistent meals, verbalizing what you need in social situations, and doing what you can for that court case.

## 2023-02-23 ENCOUNTER — Ambulatory Visit: Payer: BC Managed Care – PPO | Admitting: Family Medicine

## 2023-02-23 ENCOUNTER — Encounter: Payer: Self-pay | Admitting: Family Medicine

## 2023-02-23 ENCOUNTER — Other Ambulatory Visit (INDEPENDENT_AMBULATORY_CARE_PROVIDER_SITE_OTHER): Payer: BC Managed Care – PPO

## 2023-02-23 VITALS — BP 119/80 | HR 76 | Temp 98.7°F | Ht 62.0 in | Wt 174.0 lb

## 2023-02-23 DIAGNOSIS — R5383 Other fatigue: Secondary | ICD-10-CM

## 2023-02-23 DIAGNOSIS — R42 Dizziness and giddiness: Secondary | ICD-10-CM

## 2023-02-23 DIAGNOSIS — R55 Syncope and collapse: Secondary | ICD-10-CM

## 2023-02-23 DIAGNOSIS — R6889 Other general symptoms and signs: Secondary | ICD-10-CM | POA: Diagnosis not present

## 2023-02-23 LAB — ANEMIA PROFILE B
Basophils Absolute: 0.1 10*3/uL (ref 0.0–0.2)
Basos: 1 %
EOS (ABSOLUTE): 0.2 10*3/uL (ref 0.0–0.4)
Immature Grans (Abs): 0 10*3/uL (ref 0.0–0.1)
Immature Granulocytes: 0 %
Lymphs: 45 %
MCH: 26.1 pg — ABNORMAL LOW (ref 26.6–33.0)
MCV: 84 fL (ref 79–97)
Monocytes: 10 %
Neutrophils Absolute: 3.2 10*3/uL (ref 1.4–7.0)
Neutrophils: 42 %
RBC: 4.91 x10E6/uL (ref 3.77–5.28)
Retic Ct Pct: 1.2 % (ref 0.6–2.6)
WBC: 7.4 10*3/uL (ref 3.4–10.8)

## 2023-02-23 LAB — CMP14+EGFR

## 2023-02-23 NOTE — Progress Notes (Signed)
Acute Office Visit  Subjective:  Patient ID: Kristie Crawford, female    DOB: 04-Nov-2000, 22 y.o.   MRN: [id number]  Chief Complaint  Patient presents with  . Dizziness    Fatigue, body aches, dizziness   Dizziness Patient is in today for dizziness, fatigue, and body aches. Started 3 weeks ago.  States that yesterday it was worse than usual, 4 different spells throughout the day that last ~1 minute. Self resolve. Worsened if she moves too fast, stretches, or when she is in the shower.  Feels like about to pass out, so she will pause her activity and allow it to pass.   Endorses feelings of "air headed" and spotty vision. Denies history of anemia and vertigo.  Denies trauma, hits to head.   Review of Systems  Neurological:  Positive for dizziness.   Objective:      02/23/2023    8:42 AM 11/28/2022   10:27 AM 05/29/2022    9:59 AM  Vitals with BMI  Height 5\' 2"  5\' 2"  5\' 2"   Weight 174 lbs 196 lbs 181 lbs  BMI 31.82 35.84 33.1  Systolic 119 115   Diastolic 80 70   Pulse 76 70    Orthostatic VS for the past 72 hrs (Last 3 readings):  Orthostatic BP Patient Position BP Location Cuff Size Orthostatic Pulse  02/23/23 0849 114/80 Standing Right Arm Normal 89  02/23/23 0848 119/80 Sitting Right Arm Normal 76  02/23/23 0847 118/81 Supine Right Arm Normal 68   Physical Exam Constitutional:      General: She is awake. She is not in acute distress.    Appearance: Normal appearance. She is well-developed and well-groomed. She is not ill-appearing, toxic-appearing or diaphoretic.  Eyes:     Extraocular Movements:     Right eye: Normal extraocular motion and no nystagmus.     Left eye: Normal extraocular motion and no nystagmus.     Pupils: Pupils are equal, round, and reactive to light.  Cardiovascular:     Rate and Rhythm: Normal rate.     Pulses: Normal pulses.          Radial pulses are 2+ on the right side and 2+ on the left side.       Posterior tibial pulses are 2+ on  the right side and 2+ on the left side.     Heart sounds: Normal heart sounds. No murmur heard.    No gallop.  Pulmonary:     Effort: Pulmonary effort is normal. No respiratory distress.     Breath sounds: Normal breath sounds. No stridor. No wheezing, rhonchi or rales.  Musculoskeletal:     Cervical back: Full passive range of motion without pain and neck supple.     Right lower leg: No edema.     Left lower leg: No edema.  Skin:    General: Skin is warm.     Capillary Refill: Capillary refill takes less than 2 seconds.  Neurological:     General: No focal deficit present.     Mental Status: She is alert, oriented to person, place, and time and easily aroused. Mental status is at baseline.     GCS: GCS eye subscore is 4. GCS verbal subscore is 5. GCS motor subscore is 6.     Cranial Nerves: Cranial nerves 2-12 are intact.     Motor: No weakness or pronator drift.     Coordination: Coordination is intact. Romberg sign negative. Coordination normal. Finger-Nose-Finger Test  and Heel to [facility] Test normal. Rapid alternating movements normal.     Gait: Gait is intact. Gait and tandem walk normal.  Psychiatric:        Attention and Perception: Attention and perception normal.        Mood and Affect: Mood and affect normal.        Speech: Speech normal.        Behavior: Behavior normal. Behavior is cooperative.        Thought Content: Thought content normal. Thought content does not include homicidal or suicidal ideation. Thought content does not include homicidal or suicidal plan.        Cognition and Memory: Cognition and memory normal.        Judgment: Judgment normal.      02/23/2023    8:51 AM 11/28/2022   10:29 AM 05/29/2022    9:59 AM  Depression screen PHQ 2/9  Decreased Interest 0 1 1  Down, Depressed, Hopeless 0 0 1  PHQ - 2 Score 0 1 2  Altered sleeping 1 0 0  Tired, decreased energy 1 1 1   Change in appetite 0 1 1  Feeling bad or failure about yourself  0 0 1  Trouble  concentrating 1 3 0  Moving slowly or fidgety/restless 0 2 0  Suicidal thoughts 0 0 0  PHQ-9 Score 3 8 5   Difficult doing work/chores  Extremely dIfficult Not difficult at all      02/23/2023    8:51 AM 11/28/2022   10:29 AM 05/29/2022   10:00 AM 03/01/2022    2:25 PM  GAD 7 : Generalized Anxiety Score  Nervous, Anxious, on Edge 0 0 1 3  Control/stop worrying 0 0 1 3  Worry too much - different things 0 0 2 3  Trouble relaxing 0 2 1 3   Restless 0 0 1 3  Easily annoyed or irritable 0 0 0 0  Afraid - awful might happen 0 0 1 3  Total GAD 7 Score 0 2 7 18   Anxiety Difficulty Not difficult at all Not difficult at all Not difficult at all Extremely difficult   Assessment & Plan:  1. Dizziness Labs as below. Will communicate results to patient once available and determine next steps.   Monitor as below. Will communicate results to patient once available and determine next steps.   If symptoms continue and labs are unrevealing can consider referral to PT for vestibular rehab.  - [facility] TERM MONITOR (3-14 DAYS); Future - Anemia Profile B - VITAMIN D 25 Hydroxy (Vit-D Deficiency, Fractures) - CMP14+EGFR - TSH  2. Other fatigue As above.   3. Postural dizziness with presyncope As above.   The above assessment and management plan was discussed with the patient. The patient verbalized understanding of and has agreed to the management plan using shared-decision making. Patient is aware to call the clinic if they develop any new symptoms or if symptoms fail to improve or worsen. Patient is aware when to return to the clinic for a follow-up visit. Patient educated on when it is appropriate to go to the emergency department.   Return in about 6 weeks (around 04/06/2023) for dizziness .  Neale Burly, DNP-FNP [facility] [address] [place], [place] [address] [phone]

## 2023-02-24 LAB — CMP14+EGFR
AST: 13 IU/L (ref 0–40)
Albumin: 4.6 g/dL (ref 4.0–5.0)
Bilirubin Total: 0.6 mg/dL (ref 0.0–1.2)
Chloride: 101 mmol/L (ref 96–106)
Creatinine, Ser: 0.82 mg/dL (ref 0.57–1.00)
Globulin, Total: 2.3 g/dL (ref 1.5–4.5)
Potassium: 4.4 mmol/L (ref 3.5–5.2)
Total Protein: 6.9 g/dL (ref 6.0–8.5)
eGFR: 103 mL/min/{1.73_m2} (ref >59)

## 2023-02-24 LAB — ANEMIA PROFILE B
Eos: 2 %
Ferritin: 29 ng/mL (ref 15–150)
Folate: 15.2 ng/mL (ref >3.0)
Hematocrit: 41.1 % (ref 34.0–46.6)
Hemoglobin: 12.8 g/dL (ref 11.1–15.9)
Iron Saturation: 23 % (ref 15–55)
Iron: 84 ug/dL (ref 27–159)
Lymphocytes Absolute: 3.3 10*3/uL — ABNORMAL HIGH (ref 0.7–3.1)
MCHC: 31.1 g/dL — ABNORMAL LOW (ref 31.5–35.7)
Monocytes Absolute: 0.8 10*3/uL (ref 0.1–0.9)
Platelets: 259 10*3/uL (ref 150–450)
RDW: 14.5 % (ref 11.7–15.4)
Total Iron Binding Capacity: 361 ug/dL (ref 250–450)
UIBC: 277 ug/dL (ref 131–425)
Vitamin B-12: 570 pg/mL (ref 232–1245)

## 2023-02-24 LAB — TSH: TSH: 2.28 u[IU]/mL (ref 0.450–4.500)

## 2023-02-24 LAB — VITAMIN D 25 HYDROXY (VIT D DEFICIENCY, FRACTURES): Vit D, 25-Hydroxy: 45.5 ng/mL (ref 30.0–100.0)

## 2023-02-26 NOTE — Progress Notes (Signed)
MCH, MCHC, and lymphocytes slightly out of range. Some variation in labs is to be expected. All other labs normal.

## 2023-03-13 ENCOUNTER — Telehealth (HOSPITAL_COMMUNITY): Payer: BC Managed Care – PPO | Admitting: Psychiatry

## 2023-03-19 DIAGNOSIS — R42 Dizziness and giddiness: Secondary | ICD-10-CM | POA: Diagnosis not present

## 2023-03-20 NOTE — Progress Notes (Signed)
No significant findings on monitor. Recommend patient avoid stimulants such as caffeine (including tea, coffee, energy drinks, and chocolate)

## 2023-04-03 ENCOUNTER — Encounter (HOSPITAL_COMMUNITY): Payer: Self-pay | Admitting: Psychiatry

## 2023-04-03 ENCOUNTER — Telehealth (HOSPITAL_COMMUNITY): Payer: BC Managed Care – PPO | Admitting: Psychiatry

## 2023-04-03 DIAGNOSIS — F410 Panic disorder [episodic paroxysmal anxiety] without agoraphobia: Secondary | ICD-10-CM | POA: Diagnosis not present

## 2023-04-03 DIAGNOSIS — Z9151 Personal history of suicidal behavior: Secondary | ICD-10-CM

## 2023-04-03 DIAGNOSIS — F4312 Post-traumatic stress disorder, chronic: Secondary | ICD-10-CM | POA: Diagnosis not present

## 2023-04-03 DIAGNOSIS — F4000 Agoraphobia, unspecified: Secondary | ICD-10-CM | POA: Diagnosis not present

## 2023-04-03 DIAGNOSIS — F411 Generalized anxiety disorder: Secondary | ICD-10-CM | POA: Diagnosis not present

## 2023-04-03 DIAGNOSIS — F502 Bulimia nervosa: Secondary | ICD-10-CM

## 2023-04-03 DIAGNOSIS — R569 Unspecified convulsions: Secondary | ICD-10-CM

## 2023-04-03 DIAGNOSIS — F4310 Post-traumatic stress disorder, unspecified: Secondary | ICD-10-CM

## 2023-04-03 DIAGNOSIS — F4001 Agoraphobia with panic disorder: Secondary | ICD-10-CM

## 2023-04-03 DIAGNOSIS — R41840 Attention and concentration deficit: Secondary | ICD-10-CM

## 2023-04-03 DIAGNOSIS — F3341 Major depressive disorder, recurrent, in partial remission: Secondary | ICD-10-CM

## 2023-04-03 MED ORDER — ATOMOXETINE HCL 60 MG PO CAPS
60.0000 mg | ORAL_CAPSULE | Freq: Every day | ORAL | 0 refills | Status: DC
Start: 2023-04-03 — End: 2023-06-01

## 2023-04-03 MED ORDER — MIRTAZAPINE 45 MG PO TABS
45.0000 mg | ORAL_TABLET | Freq: Every day | ORAL | 0 refills | Status: DC
Start: 2023-04-03 — End: 2023-06-01
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
## 2023-04-03 NOTE — Patient Instructions (Signed)
We did not make any medication changes today.  Keep up the good work with taking care of your body and mind with regular meals and medication compliance/therapy respectively.  Good luck with the court case in September!

## 2023-05-24 ENCOUNTER — Encounter (HOSPITAL_COMMUNITY): Payer: Self-pay

## 2023-06-01 ENCOUNTER — Encounter (HOSPITAL_COMMUNITY): Payer: Self-pay | Admitting: Psychiatry

## 2023-06-01 ENCOUNTER — Telehealth (HOSPITAL_COMMUNITY): Payer: BC Managed Care – PPO | Admitting: Psychiatry

## 2023-06-01 DIAGNOSIS — R41840 Attention and concentration deficit: Secondary | ICD-10-CM | POA: Diagnosis not present

## 2023-06-01 DIAGNOSIS — Z3490 Encounter for supervision of normal pregnancy, unspecified, unspecified trimester: Secondary | ICD-10-CM | POA: Insufficient documentation

## 2023-06-01 DIAGNOSIS — F410 Panic disorder [episodic paroxysmal anxiety] without agoraphobia: Secondary | ICD-10-CM

## 2023-06-01 DIAGNOSIS — F4001 Agoraphobia with panic disorder: Secondary | ICD-10-CM

## 2023-06-01 DIAGNOSIS — F3341 Major depressive disorder, recurrent, in partial remission: Secondary | ICD-10-CM

## 2023-06-01 DIAGNOSIS — F5025 Bulimia nervosa, in remission: Secondary | ICD-10-CM

## 2023-06-01 DIAGNOSIS — F411 Generalized anxiety disorder: Secondary | ICD-10-CM

## 2023-06-01 DIAGNOSIS — F4312 Post-traumatic stress disorder, chronic: Secondary | ICD-10-CM

## 2023-06-01 DIAGNOSIS — Z3A01 Less than 8 weeks gestation of pregnancy: Secondary | ICD-10-CM

## 2023-06-01 DIAGNOSIS — F4310 Post-traumatic stress disorder, unspecified: Secondary | ICD-10-CM

## 2023-06-01 HISTORY — DX: Encounter for supervision of normal pregnancy, unspecified, unspecified trimester: Z34.90

## 2023-06-01 MED ORDER — SERTRALINE HCL 25 MG PO TABS: ORAL_TABLET | ORAL | 2 refills | Status: DC

## 2023-06-01 MED ORDER — ATOMOXETINE HCL 80 MG PO CAPS
80.0000 mg | ORAL_CAPSULE | Freq: Every day | ORAL | 0 refills | Status: DC
Start: 2023-06-01 — End: 2023-08-24
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
## 2023-06-01 NOTE — Patient Instructions (Addendum)
We increased the Strattera to 80 mg once daily today.  We also plan to start sertraline 25 mg tablets in 1 week after being on the increased dose of Strattera. Take half a tablet of sertraline (12.5 mg) once daily starting 06/08/2023.  If tolerating after 2 weeks can increase to full tablet daily.  As we discussed, there are risks associated with taking medication during pregnancy and risks of not treating mental illness during pregnancy.  As a review of what we discussed you can reference the material on [email or web address]:  [email or web address]

## 2023-06-12 DIAGNOSIS — O26891 Other specified pregnancy related conditions, first trimester: Secondary | ICD-10-CM | POA: Diagnosis not present

## 2023-06-12 DIAGNOSIS — O99891 Other specified diseases and conditions complicating pregnancy: Secondary | ICD-10-CM | POA: Diagnosis not present

## 2023-06-12 DIAGNOSIS — Z888 Allergy status to other drugs, medicaments and biological substances status: Secondary | ICD-10-CM | POA: Diagnosis not present

## 2023-06-12 DIAGNOSIS — Z3A01 Less than 8 weeks gestation of pregnancy: Secondary | ICD-10-CM | POA: Diagnosis not present

## 2023-06-12 DIAGNOSIS — R1032 Left lower quadrant pain: Secondary | ICD-10-CM | POA: Diagnosis not present

## 2023-06-12 DIAGNOSIS — Z881 Allergy status to other antibiotic agents status: Secondary | ICD-10-CM | POA: Diagnosis not present

## 2023-06-13 DIAGNOSIS — Z3A01 Less than 8 weeks gestation of pregnancy: Secondary | ICD-10-CM | POA: Diagnosis not present

## 2023-06-13 DIAGNOSIS — O26891 Other specified pregnancy related conditions, first trimester: Secondary | ICD-10-CM | POA: Diagnosis not present

## 2023-06-20 ENCOUNTER — Encounter: Payer: Self-pay | Admitting: Family Medicine

## 2023-06-20 ENCOUNTER — Ambulatory Visit: Payer: BC Managed Care – PPO | Admitting: Family Medicine

## 2023-06-20 VITALS — BP 113/77 | HR 89 | Temp 98.9°F | Ht 62.0 in | Wt 161.0 lb

## 2023-06-20 DIAGNOSIS — Z6829 Body mass index (BMI) 29.0-29.9, adult: Secondary | ICD-10-CM | POA: Diagnosis not present

## 2023-06-20 DIAGNOSIS — Z23 Encounter for immunization: Secondary | ICD-10-CM

## 2023-06-20 DIAGNOSIS — R635 Abnormal weight gain: Secondary | ICD-10-CM | POA: Diagnosis not present

## 2023-06-20 DIAGNOSIS — Z3A01 Less than 8 weeks gestation of pregnancy: Secondary | ICD-10-CM

## 2023-06-20 DIAGNOSIS — O09299 Supervision of pregnancy with other poor reproductive or obstetric history, unspecified trimester: Secondary | ICD-10-CM

## 2023-06-20 NOTE — Progress Notes (Signed)
Subjective:  Patient ID: Kristie Crawford, female    DOB: 2000-11-13, 22 y.o.   MRN: [id number]  Patient Care Team: Arrie Senate, FNP as PCP - General (Family Medicine) Clifton Custard as Physician Assistant (Physician Assistant)   Chief Complaint:  referral for dietician/nutritionist (Past pregnancy, health problems)   HPI: Kristie Crawford is a 22 y.o. female presenting on 06/20/2023 for referral for dietician/nutritionist (Past pregnancy, health problems) Patient concerned with previous weight gain in pregnancies. She is currently [redacted] weeks pregnant and would like a referral to nutrition to "stay on top of" her weight gain in pregnancy. States that she has gained ~117lbs with each of her prior pregnancies. Reports that she was able to lose most of it, but that it was very difficult to get out of the 200s. She reports that prior to her first pregnancy she weighed 130lbs. Prior to her second, she weight 150lbs. States that to lose weight in the past she was watching her food intake and walking. Reports that she continues to eat healthier and walk. She is currently walking now, 1 mile in the mornings, walking at work.    Relevant past medical, surgical, family, and social history reviewed and updated as indicated.  Allergies and medications reviewed and updated. Data reviewed: Chart in Epic.   Past Medical History:  Diagnosis Date   Alteration consciousness 10/07/2019   Anxiety 03/01/2022   Anxiety and depression    Depression, recurrent (HCC) 03/01/2022   Facial abscess 01/02/2015   HELLP (hemolytic anemia/elev liver enzymes/low platelets in pregnancy) 03/31/2019   History of dental surgery-wisdom teeth extraction 01/03/2015   Labor and delivery, indication for care 03/29/2019   Ovarian cyst    Pregnancy induced hypertension    PTSD (post-traumatic stress disorder)    Recurrent tonsillitis 09/2017   current antibiotic, will finish 09/25/2017   Seizure-like  activity (HCC)    Suicide ideation 01/13/2022    Past Surgical History:  Procedure Laterality Date   CESAREAN SECTION N/A 03/31/2019   Procedure: CESAREAN SECTION;  Surgeon: Levi Aland, MD;  Location: [facility];  Service: Obstetrics;  Laterality: N/A;   TONSILLECTOMY N/A 10/01/2017   Procedure: TONSILLECTOMY;  Surgeon: Serena Colonel, MD;  Location: [facility];  Service: ENT;  Laterality: N/A;   TONSILLECTOMY     WISDOM TOOTH EXTRACTION  10/2014    Social History   Socioeconomic History   Marital status: Single    Spouse name: Not on file   Number of children: 1   Years of education: 12   Highest education level: GED or equivalent  Occupational History   Occupation: Conservation officer, nature  Tobacco Use   Smoking status: Never   Smokeless tobacco: Never  Vaping Use   Vaping status: Never Used  Substance and Sexual Activity   Alcohol use: Not Currently    Comment: 1-2 on holidays; none since September 2023   Drug use: No    Comment: tried delta 8 vape once   Sexual activity: Yes    Birth control/protection: None  Other Topics Concern   Not on file  Social History Narrative   Lives at home with significant other and son.   Right-handed.   Occasional caffeine.   Social Determinants of Health   Financial Resource Strain: Low Risk  (11/28/2022)   Overall Financial Resource Strain (CARDIA)    Difficulty of Paying Living Expenses: Not hard at all  Food Insecurity: No Food Insecurity (11/28/2022)  Hunger Vital Sign    Worried About Running Out of Food in the Last Year: Never true    Ran Out of Food in the Last Year: Never true  Transportation Needs: No Transportation Needs (11/28/2022)   PRAPARE - Administrator, Civil Service (Medical): No    Lack of Transportation (Non-Medical): No  Physical Activity: Sufficiently Active (11/28/2022)   Exercise Vital Sign    Days of Exercise per Week: 5 days    Minutes of Exercise per Session: 30 min  Stress: No Stress  Concern Present (11/28/2022)   [employer] of Occupational Health - Occupational Stress Questionnaire    Feeling of Stress : Not at all  Social Connections: Socially Isolated (11/28/2022)   Social Connection and Isolation Panel [NHANES]    Frequency of Communication with Friends and Family: More than three times a week    Frequency of Social Gatherings with Friends and Family: Once a week    Attends Religious Services: Never    Database administrator or Organizations: No    Attends Engineer, structural: Not on file    Marital Status: Never married  Intimate Partner Violence: Unknown (11/15/2021)   Received from [employer], [facility]   HITS    Physically Hurt: Not on file    Insult or Talk Down To: Not on file    Threaten Physical Harm: Not on file    Scream or Curse: Not on file    Outpatient Encounter Medications as of 06/20/2023  Medication Sig   atomoxetine (STRATTERA) 80 MG capsule Take 1 capsule (80 mg total) by mouth daily.   sertraline (ZOLOFT) 25 MG tablet Take half a tablet once daily starting 06/08/2023.  If tolerating after 2 weeks can increase to full tablet daily.   No facility-administered encounter medications on file as of 06/20/2023.    Allergies  Allergen Reactions   Dexamethasone Swelling   Nitrofurantoin Other (See Comments)    Unknown Unknown    Sulfamethoxazole-Trimethoprim Other (See Comments) and Rash   Cariprazine Other (See Comments)    Seizure Seizure    Ciprofloxacin Rash and Hives    Review of Systems As per HPI  Objective:  BP 113/77   Pulse 89   Temp 98.9 F (37.2 C)   Ht 5\' 2"  (1.575 m)   Wt 161 lb (73 kg)   LMP 02/11/2023 (Exact Date)   SpO2 97%   BMI 29.45 kg/m    Wt Readings from Last 3 Encounters:  06/20/23 161 lb (73 kg)  02/23/23 174 lb (78.9 kg)  11/28/22 196 lb (88.9 kg)   Physical Exam Constitutional:      General: She is awake. She is not in acute distress.    Appearance: Normal appearance. She  is well-developed, well-groomed and overweight. She is not ill-appearing, toxic-appearing or diaphoretic.  Cardiovascular:     Rate and Rhythm: Normal rate and regular rhythm.     Pulses: Normal pulses.          Radial pulses are 2+ on the right side and 2+ on the left side.       Posterior tibial pulses are 2+ on the right side and 2+ on the left side.     Heart sounds: Normal heart sounds. No murmur heard.    No gallop.  Pulmonary:     Effort: Pulmonary effort is normal. No respiratory distress.     Breath sounds: Normal breath sounds. No stridor. No wheezing, rhonchi or rales.  Musculoskeletal:     Cervical back: Full passive range of motion without pain and neck supple.     Right lower leg: No edema.     Left lower leg: No edema.  Skin:    General: Skin is warm.     Capillary Refill: Capillary refill takes less than 2 seconds.  Neurological:     General: No focal deficit present.     Mental Status: She is alert, oriented to person, place, and time and easily aroused. Mental status is at baseline.     GCS: GCS eye subscore is 4. GCS verbal subscore is 5. GCS motor subscore is 6.     Motor: No weakness.  Psychiatric:        Attention and Perception: Attention and perception normal.        Mood and Affect: Mood and affect normal.        Speech: Speech normal.        Behavior: Behavior normal. Behavior is cooperative.        Thought Content: Thought content normal. Thought content does not include homicidal or suicidal ideation. Thought content does not include homicidal or suicidal plan.        Cognition and Memory: Cognition and memory normal.        Judgment: Judgment normal.     Results for orders placed or performed in visit on 02/23/23  Anemia Profile B  Result Value Ref Range   Total Iron Binding Capacity 361 250 - 450 ug/dL   UIBC 841 324 - 401 ug/dL   Iron 84 27 - 027 ug/dL   Iron Saturation 23 15 - 55 %   Ferritin 29 15 - 150 ng/mL   Vitamin B-12 570 232 - 1,245  pg/mL   Folate 15.2 >3.0 ng/mL   WBC 7.4 3.4 - 10.8 x10E3/uL   RBC 4.91 3.77 - 5.28 x10E6/uL   Hemoglobin 12.8 11.1 - 15.9 g/dL   Hematocrit 25.3 66.4 - 46.6 %   MCV 84 79 - 97 fL   MCH 26.1 (L) 26.6 - 33.0 pg   MCHC 31.1 (L) 31.5 - 35.7 g/dL   RDW 40.3 47.4 - 25.9 %   Platelets 259 150 - 450 x10E3/uL   Neutrophils 42 Not Estab. %   Lymphs 45 Not Estab. %   Monocytes 10 Not Estab. %   Eos 2 Not Estab. %   Basos 1 Not Estab. %   Neutrophils Absolute 3.2 1.4 - 7.0 x10E3/uL   Lymphocytes Absolute 3.3 (H) 0.7 - 3.1 x10E3/uL   Monocytes Absolute 0.8 0.1 - 0.9 x10E3/uL   EOS (ABSOLUTE) 0.2 0.0 - 0.4 x10E3/uL   Basophils Absolute 0.1 0.0 - 0.2 x10E3/uL   Immature Granulocytes 0 Not Estab. %   Immature Grans (Abs) 0.0 0.0 - 0.1 x10E3/uL   Retic Ct Pct 1.2 0.6 - 2.6 %  VITAMIN D 25 Hydroxy (Vit-D Deficiency, Fractures)  Result Value Ref Range   Vit D, 25-Hydroxy 45.5 30.0 - 100.0 ng/mL  CMP14+EGFR  Result Value Ref Range   Glucose 85 70 - 99 mg/dL   BUN 12 6 - 20 mg/dL   Creatinine, Ser 5.63 0.57 - 1.00 mg/dL   eGFR 875 >64 PP/IRJ/1.88   BUN/Creatinine Ratio 15 9 - 23   Sodium 141 134 - 144 mmol/L   Potassium 4.4 3.5 - 5.2 mmol/L   Chloride 101 96 - 106 mmol/L   CO2 25 20 - 29 mmol/L   Calcium 9.9 8.7 - 10.2 mg/dL  Total Protein 6.9 6.0 - 8.5 g/dL   Albumin 4.6 4.0 - 5.0 g/dL   Globulin, Total 2.3 1.5 - 4.5 g/dL   Bilirubin Total 0.6 0.0 - 1.2 mg/dL   Alkaline Phosphatase 85 44 - 121 IU/L   AST 13 0 - 40 IU/L   ALT 21 0 - 32 IU/L  TSH  Result Value Ref Range   TSH 2.280 0.450 - 4.500 uIU/mL       06/20/2023    8:41 AM 02/23/2023    8:51 AM 11/28/2022   10:29 AM 05/29/2022    9:59 AM 05/03/2022   10:17 AM  Depression screen PHQ 2/9  Decreased Interest 0 0 1 1   Down, Depressed, Hopeless 0 0 0 1   PHQ - 2 Score 0 0 1 2   Altered sleeping 0 1 0 0   Tired, decreased energy 0 1 1 1    Change in appetite 0 0 1 1   Feeling bad or failure about yourself  0 0 0 1   Trouble  concentrating 0 1 3 0   Moving slowly or fidgety/restless 0 0 2 0   Suicidal thoughts 0 0 0 0   PHQ-9 Score 0 3 8 5    Difficult doing work/chores Not difficult at all  Extremely dIfficult Not difficult at all      Information is confidential and restricted. Go to Review Flowsheets to unlock data.       06/20/2023    8:42 AM 02/23/2023    8:51 AM 11/28/2022   10:29 AM 05/29/2022   10:00 AM  GAD 7 : Generalized Anxiety Score  Nervous, Anxious, on Edge 0 0 0 1  Control/stop worrying 0 0 0 1  Worry too much - different things 0 0 0 2  Trouble relaxing 0 0 2 1  Restless 0 0 0 1  Easily annoyed or irritable 0 0 0 0  Afraid - awful might happen 0 0 0 1  Total GAD 7 Score 0 0 2 7  Anxiety Difficulty Not difficult at all Not difficult at all Not difficult at all Not difficult at all   Pertinent labs & imaging results that were available during my care of the patient were reviewed by me and considered in my medical decision making.  Assessment & Plan:  Oumou was seen today for referral for dietician/nutritionist.  Diagnoses and all orders for this visit:  BMI 29.0-29.9,adult Referral placed as below for patient to follow up with nutrition.  -     Amb ref to Medical Nutrition Therapy-MNT  Less than [redacted] weeks gestation of pregnancy Referral placed. Patient is established with Ob/gyn. However, she would like to move to [facility] in [place]. Reports that she and psychiatry have discussed continuing Strattera through pregnancy. She is aware of potential risk in pregnancy.  -     Ambulatory referral to Obstetrics / Gynecology  History of HELLP syndrome, currently pregnant -     Ambulatory referral to Obstetrics / Gynecology -     Amb ref to Medical Nutrition Therapy-MNT  Encounter for immunization -     Flu vaccine trivalent PF, 6mos and older(Flulaval,Afluria,Fluarix,Fluzone)  Continue all other maintenance medications.  Follow up plan: Return in about 1 year (around 06/19/2024)  for CPE.  Continue healthy lifestyle choices, including diet (rich in fruits, vegetables, and lean proteins, and low in salt and simple carbohydrates) and exercise (at least 30 minutes of moderate physical activity daily).  Written and verbal instructions  provided   The above assessment and management plan was discussed with the patient. The patient verbalized understanding of and has agreed to the management plan. Patient is aware to call the clinic if they develop any new symptoms or if symptoms persist or worsen. Patient is aware when to return to the clinic for a follow-up visit. Patient educated on when it is appropriate to go to the emergency department.   Neale Burly, DNP-FNP [facility] [address] [place], [place] [address] [phone]

## 2023-06-20 NOTE — Patient Instructions (Signed)

## 2023-06-21 DIAGNOSIS — O209 Hemorrhage in early pregnancy, unspecified: Secondary | ICD-10-CM | POA: Diagnosis not present

## 2023-06-21 DIAGNOSIS — O2341 Unspecified infection of urinary tract in pregnancy, first trimester: Secondary | ICD-10-CM | POA: Diagnosis not present

## 2023-06-21 DIAGNOSIS — R102 Pelvic and perineal pain: Secondary | ICD-10-CM | POA: Diagnosis not present

## 2023-06-21 DIAGNOSIS — Z3A01 Less than 8 weeks gestation of pregnancy: Secondary | ICD-10-CM | POA: Diagnosis not present

## 2023-06-21 DIAGNOSIS — O208 Other hemorrhage in early pregnancy: Secondary | ICD-10-CM | POA: Diagnosis not present

## 2023-06-22 ENCOUNTER — Emergency Department (HOSPITAL_COMMUNITY): Payer: BC Managed Care – PPO

## 2023-06-22 ENCOUNTER — Emergency Department (HOSPITAL_COMMUNITY)
Admission: EM | Admit: 2023-06-22 | Discharge: 2023-06-22 | Disposition: A | Discharge status: Home / Self Care | Payer: BC Managed Care – PPO | Attending: Emergency Medicine | Admitting: Emergency Medicine

## 2023-06-22 ENCOUNTER — Other Ambulatory Visit: Payer: Self-pay

## 2023-06-22 ENCOUNTER — Encounter (HOSPITAL_COMMUNITY): Payer: Self-pay

## 2023-06-22 DIAGNOSIS — O209 Hemorrhage in early pregnancy, unspecified: Secondary | ICD-10-CM | POA: Insufficient documentation

## 2023-06-22 DIAGNOSIS — Z3A01 Less than 8 weeks gestation of pregnancy: Secondary | ICD-10-CM | POA: Diagnosis not present

## 2023-06-22 DIAGNOSIS — Z3A00 Weeks of gestation of pregnancy not specified: Secondary | ICD-10-CM | POA: Diagnosis not present

## 2023-06-22 DIAGNOSIS — O034 Incomplete spontaneous abortion without complication: Secondary | ICD-10-CM | POA: Diagnosis not present

## 2023-06-22 DIAGNOSIS — O039 Complete or unspecified spontaneous abortion without complication: Secondary | ICD-10-CM | POA: Diagnosis not present

## 2023-06-22 LAB — URINALYSIS, ROUTINE W REFLEX MICROSCOPIC
Bilirubin Urine: NEGATIVE
Glucose, UA: NEGATIVE mg/dL
Ketones, ur: NEGATIVE mg/dL
Nitrite: NEGATIVE
Protein, ur: 100 mg/dL — AB
RBC / HPF: >50 RBC/hpf (ref 0–5)
Specific Gravity, Urine: 1.028 (ref 1.005–1.030)
pH: 5 (ref 5.0–8.0)

## 2023-06-22 LAB — BASIC METABOLIC PANEL
Anion gap: 6 (ref 5–15)
BUN: 10 mg/dL (ref 6–20)
CO2: 26 mmol/L (ref 22–32)
Calcium: 9.1 mg/dL (ref 8.9–10.3)
Chloride: 108 mmol/L (ref 98–111)
Creatinine, Ser: 0.52 mg/dL (ref 0.44–1.00)
GFR, Estimated: >60 mL/min (ref >60)
Glucose, Bld: 74 mg/dL (ref 70–99)
Potassium: 3.9 mmol/L (ref 3.5–5.1)
Sodium: 140 mmol/L (ref 135–145)

## 2023-06-22 LAB — CBC WITH DIFFERENTIAL/PLATELET
Abs Immature Granulocytes: 0.02 10*3/uL (ref 0.00–0.07)
Basophils Absolute: 0 10*3/uL (ref 0.0–0.1)
Basophils Relative: 0 %
Eosinophils Absolute: 0.1 10*3/uL (ref 0.0–0.5)
Eosinophils Relative: 1 %
HCT: 37.4 % (ref 36.0–46.0)
Hemoglobin: 12.5 g/dL (ref 12.0–15.0)
Immature Granulocytes: 0 %
Lymphocytes Relative: 26 %
Lymphs Abs: 2 10*3/uL (ref 0.7–4.0)
MCH: 28.8 pg (ref 26.0–34.0)
MCHC: 33.4 g/dL (ref 30.0–36.0)
MCV: 86.2 fL (ref 80.0–100.0)
Monocytes Absolute: 0.7 10*3/uL (ref 0.1–1.0)
Monocytes Relative: 9 %
Neutro Abs: 4.7 10*3/uL (ref 1.7–7.7)
Neutrophils Relative %: 64 %
Platelets: 169 10*3/uL (ref 150–400)
RBC: 4.34 MIL/uL (ref 3.87–5.11)
RDW: 13.1 % (ref 11.5–15.5)
WBC: 7.5 10*3/uL (ref 4.0–10.5)
nRBC: 0 % (ref 0.0–0.2)

## 2023-06-22 LAB — TYPE AND SCREEN
ABO/RH(D): B POS
Antibody Screen: NEGATIVE

## 2023-06-22 LAB — HCG, QUANTITATIVE, PREGNANCY: hCG, Beta Chain, Quant, S: 2323 m[IU]/mL — ABNORMAL HIGH (ref <5)

## 2023-06-22 MED ORDER — ONDANSETRON HCL 4 MG PO TABS: 4.0000 mg | ORAL_TABLET | Freq: Four times a day (QID) | ORAL | 0 refills | Status: DC

## 2023-06-22 MED ORDER — OXYCODONE HCL 5 MG PO TABS: 5.0000 mg | ORAL_TABLET | ORAL | 0 refills | Status: DC | PRN

## 2023-06-22 MED ORDER — OXYCODONE-ACETAMINOPHEN 5-325 MG PO TABS: 1.0000 | ORAL_TABLET | Freq: Four times a day (QID) | ORAL | 0 refills | Status: DC | PRN

## 2023-06-22 MED ORDER — MISOPROSTOL 200 MCG PO TABS
600.0000 ug | ORAL_TABLET | Freq: Once | ORAL | Status: AC
  Administered 2023-06-22: 600 ug via ORAL
  Filled 2023-06-22: qty 3

## 2023-06-22 MED ORDER — HYDROCODONE-ACETAMINOPHEN 5-325 MG PO TABS
1.0000 | ORAL_TABLET | Freq: Once | ORAL | Status: AC
  Administered 2023-06-22: 1 via ORAL
  Filled 2023-06-22: qty 1

## 2023-06-22 NOTE — ED Provider Notes (Signed)
EMERGENCY DEPARTMENT AT [facility] Provider Note   CSN: [id number] Arrival date & time: 06/22/23  1551     History  Chief Complaint  Patient presents with   Miscarriage    Kristie Crawford is a 22 y.o. female.  She is a G3 P2.  Pregnancy was complicated by HELLP and she had preterm delivery at 34 weeks, her second pregnancy was uncomplicated.  Currently about [redacted] weeks pregnant, had a small amount of bleeding yesterday and was seen at [facility] and had ultrasound that showed likely fetal demise.  It was when answered having heavier bleeding and cramping, passing blood clots.  [facility] and [facility] she come to [facility].  Patient reports she soaked through about 3 pads today.  Denies any passing of tissue.  No fevers.  Pain is manageable as [place] as she is sitting.  HPI     Home Medications Prior to Admission medications   Medication Sig Start Date End Date Taking? Authorizing Provider  atomoxetine (STRATTERA) 80 MG capsule Take 1 capsule (80 mg total) by mouth daily. 06/01/23 08/30/23  Elsie Lincoln, MD  sertraline (ZOLOFT) 25 MG tablet Take half a tablet once daily starting 06/08/2023.  If tolerating after 2 weeks can increase to full tablet daily. 06/01/23   Elsie Lincoln, MD      Allergies    Dexamethasone, Nitrofurantoin, Sulfamethoxazole-trimethoprim, Cariprazine, and Ciprofloxacin    Review of Systems   Review of Systems  Physical Exam Updated Vital Signs BP 114/71   Pulse (!) 112   Temp 99 F (37.2 C) (Oral)   Resp 16   Ht 5\' 2"  (1.575 m)   Wt 73.5 kg   LMP 02/11/2023 (Exact Date)   SpO2 97%   BMI 29.63 kg/m  Physical Exam Vitals and nursing note reviewed.  Constitutional:      General: She is not in acute distress.    Appearance: She is well-developed.  HENT:     Head: Normocephalic and atraumatic.     Mouth/Throat:     Mouth: Mucous membranes are moist.  Eyes:      Conjunctiva/sclera: Conjunctivae normal.  Cardiovascular:     Rate and Rhythm: Normal rate and regular rhythm.     Heart sounds: No murmur heard. Pulmonary:     Effort: Pulmonary effort is normal. No respiratory distress.     Breath sounds: Normal breath sounds.  Abdominal:     Palpations: Abdomen is soft.     Tenderness: There is no abdominal tenderness.  Genitourinary:    Comments: Pt declined pelvic  Musculoskeletal:        General: No swelling.     Cervical back: Neck supple.  Skin:    General: Skin is warm and dry.     Capillary Refill: Capillary refill takes less than 2 seconds.  Neurological:     General: No focal deficit present.     Mental Status: She is alert and oriented to person, place, and time.  Psychiatric:        Mood and Affect: Mood normal.     ED Results / Procedures / Treatments   Labs (all labs ordered are listed, but only abnormal results are displayed) Labs Reviewed  HCG, QUANTITATIVE, PREGNANCY - Abnormal; Notable for the following components:      Result Value   hCG, Beta Chain, Quant, S 2,323 (*)    All other components within normal limits  CBC WITH DIFFERENTIAL/PLATELET  BASIC METABOLIC PANEL  URINALYSIS, ROUTINE W REFLEX MICROSCOPIC  TYPE AND SCREEN    EKG None  Radiology No results found.  Procedures Procedures    Medications Ordered in ED Medications  HYDROcodone-acetaminophen (NORCO/VICODIN) 5-325 MG per tablet 1 tablet (has no administration in time range)    ED Course/ Medical Decision Making/ A&P                                 Medical Decision Making Ddx: Spontaneous abortion, ectopic pregnancy, anemia, other  ED course: Patient here for increased bleeding, cramping related to likely miscarriage, had ultrasound yesterday that showed no fetal heart rate.  Notes that she had a lot of clots and cramping today, was told to come into the ER for evaluation by her OB/GYN on the phone today.  [facility]  I reviewed patient's records from yesterday as well as her labs and ultrasound.  hCG yesterday was 3797, child showed pregnancy at 7 weeks 2 days with no visualized cardiac activity  Patient's labs today are stable, hemoglobin today 12.5, was 12.7 yesterday.  She declined pelvic exam, she is only gone through 3 pads throughout the day today and is not having dizziness.  Will treat her pain.  Patient's ultrasound is still pending at this time, initially slightly tachycardic will repeat her vitals.  Signed out to Riki Sheer, PA-C at this time.   Amount and/or Complexity of Data Reviewed Labs: ordered. Radiology: ordered.           Final Clinical Impression(s) / ED Diagnoses Final diagnoses:  None    Rx / DC Orders ED Discharge Orders     None         Ma Rings, PA-C 06/22/23 1851    Sloan Leiter, DO 06/22/23 2359

## 2023-06-22 NOTE — ED Provider Notes (Signed)
Accepted handoff at shift change from [facility], [place]. Please see prior provider note for more detail.   Briefly: Patient is 22 y.o. presenting for vaginal bleeding in setting of pregnancy.  Was seen yesterday at [facility] and ultrasound there showed evidence of fetal demise.  Beta-hCG is trending down.  DDX: concern for miscarriage, ectopic pregnancy, endometritis, retained products of conception, symptomatic anemia, vaginal bleeding  Plan: Follow-up on ultrasound, reassess pain   Physical Exam  BP 115/72 (BP Location: Right Arm)   Pulse 89   Temp 98.2 F (36.8 C) (Oral)   Resp 14   Ht 5\' 2"  (1.575 m)   Wt 73.5 kg   LMP 02/11/2023 (Exact Date)   SpO2 97%   BMI 29.63 kg/m   Physical Exam  Procedures  Procedures  ED Course / MDM   Clinical Course as of 06/22/23 2232  [facility] Jun 22, 2023  2227 Misoprostil, 6 tablets of oxcodone. Dr. Para March [JR]    Clinical Course User Index [JR] Gareth Eagle, PA-C   Medical Decision Making Amount and/or Complexity of Data Reviewed Labs: ordered. Radiology: ordered.  Risk Prescription drug management.     Ultrasound revealed products of conception but lack of cardiac activity. hCG also trending down.  Discussed patient with Dr. Para March of OB she agrees that is likely an inevitable miscarriage.  Advised patient of the findings and diagnosis.  Dr. Para March recommended to start her on misoprostol and send her home with Zofran and oxycodone.  We discussed with patient strict return precautions as the misoprostol could potentially precipitate some breakthrough bleeding.  Dr. Para March also mention that she will contact [facility] as patient is already hoping to establish care there to see if she can get an appointment earlier sometime next week.     Gareth Eagle, PA-C 06/22/23 2304    Sloan Leiter, DO 06/22/23 2359

## 2023-06-22 NOTE — ED Notes (Signed)
Cytotec education completed with pt Pt understands pain, to go vial of oxy to be given Pt aware to come back to ED if she bleeds through 1 large pad in an hour and if clots are larger than golf balls.  Heating pad to help with cramps.

## 2023-06-22 NOTE — ED Triage Notes (Signed)
[facility] told pt she had miscarriage [redacted]w[redacted]d [place] Was told there was no heart beat  Pt also given keflex for UTI Pt stated that bleeding increased to heavy bright red blood today, heavy with blood clots.   G3P2

## 2023-06-22 NOTE — Consult Note (Signed)
   OB/GYN Telephone Consult  Kristie Crawford is a 22 y.o. [phone] presenting with vaginal bleeding.   I was called for a consult regarding the care of this patient by [facility].    The provider had a clinical question regarding management of inevitable SAB.   The provider presented the following relevant clinical information and I performed a chart review on the patient and reviewed available documentation:  Pt is a [birth year] who is undergoing SAB. POC noted in LUS/cervix. Patient has vaginal bleeding and cramping. She has an appt in December with [facility]. Prior GYN care was with [facility]. She would like to continue with [facility].   CBC    Component Value Date/Time   WBC 7.5 06/22/2023 1749   RBC 4.34 06/22/2023 1749   HGB 12.5 06/22/2023 1749   HGB 12.8 02/23/2023 0921   HCT 37.4 06/22/2023 1749   HCT 41.1 02/23/2023 0921   PLT 169 06/22/2023 1749   PLT 259 02/23/2023 0921   MCV 86.2 06/22/2023 1749   MCV 84 02/23/2023 0921   MCH 28.8 06/22/2023 1749   MCHC 33.4 06/22/2023 1749   RDW 13.1 06/22/2023 1749   RDW 14.5 02/23/2023 0921   LYMPHSABS 2.0 06/22/2023 1749   LYMPHSABS 3.3 (H) 02/23/2023 0921   MONOABS 0.7 06/22/2023 1749   EOSABS 0.1 06/22/2023 1749   EOSABS 0.2 02/23/2023 0921   BASOSABS 0.0 06/22/2023 1749   BASOSABS 0.1 02/23/2023 0921   HCG in [facility]: 2323  I reviewed her pelvic ultrasound images independently and my findings are: gestational sac in LUS/cervix consistent with SAB in progress.   I reviewed CareEverywhere: She had an HCG that was 3797. Hgb was 12.7. She also had an [place] yesterday in the ED with confirmed IUP with CRL measuring 11.35mm and no cardiac activity c/w confirmed missed AB at that point.   BP 115/72 (BP Location: Right Arm)   Pulse 89   Temp 98.2 F (36.8 C) (Oral)   Resp 14   Ht 5\' 2"  (1.575 m)   Wt 73.5 kg   LMP 02/11/2023 (Exact Date)   SpO2 97%   BMI 29.63 kg/m   Exam- performed by consulting provider   Recommendations:  -Findings today  all confirm miscarriage in progress. Patient had confirmed missed AB yesterday by [place] yesterday at [facility] ED.  -Recommend Misoprostol vaginally 800 mg -Provide oxyocodone ~6 tablets and zofran for associated pain and nausea with SAB.  -Provide bleeding precautions  -Message sent to [facility] to arrange sooner follow up.   Thank you for this consult and if additional recommendations are needed please call [phone] for the OB/GYN attending on service at [facility].   I spent approximately 5 minutes directly consulting with the provider and verbally discussing this case. Additionally 10 minutes minutes was spent performing chart review and documentation.   Milas Hock, MD Attending Obstetrician & Gynecologist, [facility] for [facility], [facility]

## 2023-06-22 NOTE — ED Notes (Signed)
Pt in US

## 2023-06-22 NOTE — Discharge Instructions (Addendum)
Evaluation today revealed that you likely are having a miscarriage.  We have started you on misoprostol.  It is a medicine that will stimulate uterine contractions and you may have some similar symptoms as you did with the childbirth in the past.  Symptoms includes vaginal bleeding, abdominal pain.  I have sent Zofran and oxycodone to your pharmacy.  You can take this to help treat your symptoms.  Also please follow-up with [facility].  If you start to have excessive vaginal bleeding and you are soaking 1 pad per hour, you develop extreme fatigue, shortness of breath or any other concerning symptom please return emergency department further evaluation.

## 2023-06-24 ENCOUNTER — Encounter: Payer: Self-pay | Admitting: Advanced Practice Midwife

## 2023-06-26 ENCOUNTER — Telehealth (HOSPITAL_COMMUNITY): Payer: BC Managed Care – PPO | Admitting: Psychiatry

## 2023-06-27 ENCOUNTER — Telehealth (HOSPITAL_COMMUNITY): Payer: Self-pay | Admitting: *Deleted

## 2023-06-27 NOTE — Telephone Encounter (Signed)
Per pt she missed carried and she would like for provider to be informed

## 2023-06-27 NOTE — Telephone Encounter (Signed)
LMOM

## 2023-06-28 MED FILL — Oxycodone w/ Acetaminophen Tab 5-325 MG: ORAL | Qty: 6 | Status: AC

## 2023-06-29 ENCOUNTER — Telehealth (HOSPITAL_COMMUNITY): Payer: BC Managed Care – PPO | Admitting: Psychiatry

## 2023-06-29 ENCOUNTER — Encounter (HOSPITAL_COMMUNITY): Payer: Self-pay

## 2023-07-06 ENCOUNTER — Telehealth: Payer: Self-pay

## 2023-07-06 NOTE — Telephone Encounter (Signed)
Transition Care Management Unsuccessful Follow-up Telephone Call  Date of discharge and from where:  06/22/2023 [facility]  Attempts:  1st Attempt  Reason for unsuccessful TCM follow-up call:  Left voice message  Lori Liew [facility]  [facility], [facility] Resource Care Guide Direct Dial: [phone]  Website: [email or web address]

## 2023-07-09 ENCOUNTER — Telehealth: Payer: Self-pay

## 2023-07-09 ENCOUNTER — Ambulatory Visit: Payer: BC Managed Care – PPO | Admitting: Adult Health

## 2023-07-09 NOTE — Telephone Encounter (Signed)
Transition Care Management Unsuccessful Follow-up Telephone Call  Date of discharge and from where:  06/22/2023 [facility]  Attempts:  2nd Attempt  Reason for unsuccessful TCM follow-up call:  Left voice message  Mendi Constable [facility]  [facility] Resource Care Guide Direct Dial: [phone]  Website: [email or web address]

## 2023-07-10 ENCOUNTER — Telehealth (HOSPITAL_COMMUNITY): Payer: BC Managed Care – PPO | Admitting: Psychiatry

## 2023-07-16 ENCOUNTER — Telehealth (HOSPITAL_COMMUNITY): Payer: BC Managed Care – PPO | Admitting: Psychiatry

## 2023-07-16 ENCOUNTER — Encounter (HOSPITAL_COMMUNITY): Payer: Self-pay | Admitting: Psychiatry

## 2023-07-16 DIAGNOSIS — F410 Panic disorder [episodic paroxysmal anxiety] without agoraphobia: Secondary | ICD-10-CM

## 2023-07-16 DIAGNOSIS — F4001 Agoraphobia with panic disorder: Secondary | ICD-10-CM | POA: Diagnosis not present

## 2023-07-16 DIAGNOSIS — F411 Generalized anxiety disorder: Secondary | ICD-10-CM

## 2023-07-16 DIAGNOSIS — F4310 Post-traumatic stress disorder, unspecified: Secondary | ICD-10-CM

## 2023-07-16 DIAGNOSIS — F5025 Bulimia nervosa, in remission: Secondary | ICD-10-CM | POA: Diagnosis not present

## 2023-07-16 DIAGNOSIS — F331 Major depressive disorder, recurrent, moderate: Secondary | ICD-10-CM

## 2023-07-16 MED ORDER — SERTRALINE HCL 25 MG PO TABS: ORAL_TABLET | ORAL | 2 refills | Status: DC

## 2023-07-16 NOTE — Patient Instructions (Signed)
We increased the sertraline to 37.5 mg once a day today.  You will take this by taking 1.5 of the 25 mg tablets once a day.
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
## 2023-07-23 ENCOUNTER — Encounter: Payer: BC Managed Care – PPO | Admitting: *Deleted

## 2023-07-23 ENCOUNTER — Other Ambulatory Visit: Payer: BC Managed Care – PPO

## 2023-07-23 ENCOUNTER — Encounter: Payer: BC Managed Care – PPO | Admitting: Advanced Practice Midwife

## 2023-07-24 ENCOUNTER — Encounter: Payer: Self-pay | Admitting: Family Medicine

## 2023-07-26 ENCOUNTER — Other Ambulatory Visit: Payer: Self-pay | Admitting: Family Medicine

## 2023-07-26 ENCOUNTER — Encounter: Payer: Self-pay | Admitting: Family Medicine

## 2023-07-26 ENCOUNTER — Ambulatory Visit: Payer: BC Managed Care – PPO | Admitting: Family Medicine

## 2023-07-26 VITALS — BP 107/70 | HR 70 | Temp 98.5°F | Ht 62.0 in | Wt 158.0 lb

## 2023-07-26 DIAGNOSIS — J029 Acute pharyngitis, unspecified: Secondary | ICD-10-CM

## 2023-07-26 DIAGNOSIS — R21 Rash and other nonspecific skin eruption: Secondary | ICD-10-CM | POA: Diagnosis not present

## 2023-07-26 DIAGNOSIS — R6889 Other general symptoms and signs: Secondary | ICD-10-CM

## 2023-07-26 LAB — CULTURE, GROUP A STREP

## 2023-07-26 LAB — RAPID STREP SCREEN (MED CTR MEBANE ONLY): Strep Gp A Ag, IA W/Reflex: NEGATIVE

## 2023-07-26 NOTE — Telephone Encounter (Signed)
Reviewed in office visit

## 2023-07-26 NOTE — Progress Notes (Signed)
Subjective:  Patient ID: Kristie Crawford, female    DOB: 10/25/2000, 22 y.o.   MRN: [id number]  Patient Care Team: Arrie Senate, FNP as PCP - General (Family Medicine) Clifton Custard as Physician Assistant (Physician Assistant)   Chief Complaint:  Sore Throat (Sore throat/Fatigue/Hot flashes/Spots on skin/X 2 weeks)   HPI: Kristie Crawford is a 22 y.o. female presenting on 07/26/2023 for Sore Throat (Sore throat/Fatigue/Hot flashes/Spots on skin/X 2 weeks)   Sore Throat   1. Flu-like symptoms/Sore throat States that sore throat started 2 weeks ago. Reports extreme fatigue. States that it does not matter what time she goes to bed she feels fatigued all day. She started taking sertraline at night now and that has not helped. Reports intermittent stomach pains. Heat flashes off and on. Started with a rash two days ago. Sent pictures in MyChart. Feels that they are getting better. Endorses dry cough every now and then, not every day. Not taking anything OTC.   Relevant past medical, surgical, family, and social history reviewed and updated as indicated.  Allergies and medications reviewed and updated. Data reviewed: Chart in Epic.   Past Medical History:  Diagnosis Date   Alteration consciousness 10/07/2019   Anxiety 03/01/2022   Anxiety and depression    Depression, recurrent (HCC) 03/01/2022   Facial abscess 01/02/2015   HELLP (hemolytic anemia/elev liver enzymes/low platelets in pregnancy) 03/31/2019   History of dental surgery-wisdom teeth extraction 01/03/2015   Labor and delivery, indication for care 03/29/2019   Ovarian cyst    Pregnancy induced hypertension    Pregnant 06/01/2023   PTSD (post-traumatic stress disorder)    Recurrent tonsillitis 09/2017   current antibiotic, will finish 09/25/2017   Seizure-like activity (HCC)    Suicide ideation 01/13/2022    Past Surgical History:  Procedure Laterality Date   CESAREAN SECTION N/A 03/31/2019    Procedure: CESAREAN SECTION;  Surgeon: Levi Aland, MD;  Location: [facility];  Service: Obstetrics;  Laterality: N/A;   TONSILLECTOMY N/A 10/01/2017   Procedure: TONSILLECTOMY;  Surgeon: Serena Colonel, MD;  Location: [facility];  Service: ENT;  Laterality: N/A;   TONSILLECTOMY     WISDOM TOOTH EXTRACTION  10/2014    Social History   Socioeconomic History   Marital status: Single    Spouse name: Not on file   Number of children: 1   Years of education: 12   Highest education level: GED or equivalent  Occupational History   Occupation: Conservation officer, nature  Tobacco Use   Smoking status: Never   Smokeless tobacco: Never  Vaping Use   Vaping status: Never Used  Substance and Sexual Activity   Alcohol use: Not Currently    Comment: 1-2 on holidays; none since September 2023   Drug use: No    Comment: tried delta 8 vape once   Sexual activity: Yes    Birth control/protection: None  Other Topics Concern   Not on file  Social History Narrative   Lives at home with significant other and son.   Right-handed.   Occasional caffeine.   Social Drivers of Corporate investment banker Strain: Low Risk  (11/28/2022)   Overall Financial Resource Strain (CARDIA)    Difficulty of Paying Living Expenses: Not hard at all  Food Insecurity: No Food Insecurity (11/28/2022)   Hunger Vital Sign    Worried About Running Out of Food in the Last Year: Never true    Ran Out of  Food in the Last Year: Never true  Transportation Needs: No Transportation Needs (11/28/2022)   PRAPARE - Administrator, Civil Service (Medical): No    Lack of Transportation (Non-Medical): No  Physical Activity: Sufficiently Active (11/28/2022)   Exercise Vital Sign    Days of Exercise per Week: 5 days    Minutes of Exercise per Session: 30 min  Stress: No Stress Concern Present (11/28/2022)   [employer] of Occupational Health - Occupational Stress Questionnaire    Feeling of Stress : Not at all   Social Connections: Socially Isolated (11/28/2022)   Social Connection and Isolation Panel [NHANES]    Frequency of Communication with Friends and Family: More than three times a week    Frequency of Social Gatherings with Friends and Family: Once a week    Attends Religious Services: Never    Database administrator or Organizations: No    Attends Engineer, structural: Not on file    Marital Status: Never married  Intimate Partner Violence: Unknown (11/15/2021)   Received from [employer], [facility]   HITS    Physically Hurt: Not on file    Insult or Talk Down To: Not on file    Threaten Physical Harm: Not on file    Scream or Curse: Not on file    Outpatient Encounter Medications as of 07/26/2023  Medication Sig   atomoxetine (STRATTERA) 80 MG capsule Take 1 capsule (80 mg total) by mouth daily.   ondansetron (ZOFRAN) 4 MG tablet Take 1 tablet (4 mg total) by mouth every 6 (six) hours.   Prenatal Vit-Fe Fumarate-FA (PRENATAL MULTIVITAMIN) TABS tablet Take 1 tablet by mouth daily at 12 noon.   sertraline (ZOLOFT) 25 MG tablet Take 1.5 tablets once daily.   No facility-administered encounter medications on file as of 07/26/2023.    Allergies  Allergen Reactions   Dexamethasone Swelling   Nitrofurantoin Other (See Comments)    Unknown   Sulfamethoxazole-Trimethoprim Other (See Comments) and Rash   Cariprazine Other (See Comments)    Seizure   Ciprofloxacin Rash and Hives   Review of Systems As per HPI  Objective:  BP 107/70 (BP Location: Right Arm, Patient Position: Sitting, Cuff Size: Normal)   Pulse 70   Temp 98.5 F (36.9 C)   Ht 5\' 2"  (1.575 m)   Wt 158 lb (71.7 kg)   LMP 02/11/2023 (Exact Date)   SpO2 97%   Breastfeeding No   BMI 28.90 kg/m    Wt Readings from Last 3 Encounters:  07/26/23 158 lb (71.7 kg)  06/22/23 162 lb (73.5 kg)  06/20/23 161 lb (73 kg)    Physical Exam Constitutional:      General: She is awake. She is not in acute  distress.    Appearance: Normal appearance. She is well-developed and well-groomed. She is ill-appearing. She is not toxic-appearing or diaphoretic.  HENT:     Right Ear: No drainage, swelling or tenderness. No middle ear effusion. There is no impacted cerumen. No foreign body. No mastoid tenderness. No PE tube. No hemotympanum. Tympanic membrane is injected and erythematous. Tympanic membrane is not scarred, perforated, retracted or bulging.     Left Ear: Tympanic membrane, ear canal and external ear normal.     Nose: No congestion or rhinorrhea.     Mouth/Throat:     Lips: Pink. No lesions.     Mouth: Mucous membranes are moist. No injury or oral lesions.     Tongue:  No lesions. Tongue does not deviate from midline.     Palate: No mass and lesions.     Pharynx: Posterior oropharyngeal erythema and postnasal drip present. No pharyngeal swelling or uvula swelling.     Tonsils: No tonsillar exudate or tonsillar abscesses.  Cardiovascular:     Rate and Rhythm: Normal rate.     Pulses: Normal pulses.          Radial pulses are 2+ on the right side and 2+ on the left side.       Posterior tibial pulses are 2+ on the right side and 2+ on the left side.     Heart sounds: Normal heart sounds. No murmur heard.    No gallop.  Pulmonary:     Effort: Pulmonary effort is normal. No respiratory distress.     Breath sounds: Normal breath sounds. No stridor. No wheezing, rhonchi or rales.  Abdominal:     General: Abdomen is flat. Bowel sounds are normal.     Palpations: Abdomen is soft.     Tenderness: There is no abdominal tenderness.     Hernia: No hernia is present.  Musculoskeletal:     Cervical back: Full passive range of motion without pain and neck supple.     Right lower leg: No edema.     Left lower leg: No edema.  Lymphadenopathy:     Head:     Right side of head: No submental, submandibular, tonsillar, preauricular or posterior auricular adenopathy.     Left side of head: No submental,  submandibular, tonsillar, preauricular or posterior auricular adenopathy.     Cervical: Cervical adenopathy present.     Right cervical: No superficial or deep cervical adenopathy.    Left cervical: Superficial cervical adenopathy present. No deep cervical adenopathy.     Comments: Tender to palpation along cervical chain   Skin:    General: Skin is warm.     Capillary Refill: Capillary refill takes less than 2 seconds.     Coloration: Skin is pale.     Comments: Small, erythematous, petechial rash along bilateral legs, improving   Neurological:     General: No focal deficit present.     Mental Status: She is oriented to person, place, and time and easily aroused. Mental status is at baseline. She is lethargic.     GCS: GCS eye subscore is 4. GCS verbal subscore is 5. GCS motor subscore is 6.     Motor: No weakness.  Psychiatric:        Attention and Perception: Attention and perception normal.        Mood and Affect: Mood and affect normal.        Speech: Speech normal.        Behavior: Behavior normal. Behavior is cooperative.        Thought Content: Thought content normal. Thought content does not include homicidal or suicidal ideation. Thought content does not include homicidal or suicidal plan.        Cognition and Memory: Cognition and memory normal.        Judgment: Judgment normal.    Results for orders placed or performed during the hospital encounter of 06/22/23  CBC with Differential   Collection Time: 06/22/23  5:49 PM  Result Value Ref Range   WBC 7.5 4.0 - 10.5 K/uL   RBC 4.34 3.87 - 5.11 MIL/uL   Hemoglobin 12.5 12.0 - 15.0 g/dL   HCT 01.0 93.2 - 35.5 %   MCV  86.2 80.0 - 100.0 fL   MCH 28.8 26.0 - 34.0 pg   MCHC 33.4 30.0 - 36.0 g/dL   RDW 16.1 09.6 - 04.5 %   Platelets 169 150 - 400 K/uL   nRBC 0.0 0.0 - 0.2 %   Neutrophils Relative % 64 %   Neutro Abs 4.7 1.7 - 7.7 K/uL   Lymphocytes Relative 26 %   Lymphs Abs 2.0 0.7 - 4.0 K/uL   Monocytes Relative 9 %    Monocytes Absolute 0.7 0.1 - 1.0 K/uL   Eosinophils Relative 1 %   Eosinophils Absolute 0.1 0.0 - 0.5 K/uL   Basophils Relative 0 %   Basophils Absolute 0.0 0.0 - 0.1 K/uL   Immature Granulocytes 0 %   Abs Immature Granulocytes 0.02 0.00 - 0.07 K/uL  Basic metabolic panel   Collection Time: 06/22/23  5:49 PM  Result Value Ref Range   Sodium 140 135 - 145 mmol/L   Potassium 3.9 3.5 - 5.1 mmol/L   Chloride 108 98 - 111 mmol/L   CO2 26 22 - 32 mmol/L   Glucose, Bld 74 70 - 99 mg/dL   BUN 10 6 - 20 mg/dL   Creatinine, Ser 4.09 0.44 - 1.00 mg/dL   Calcium 9.1 8.9 - 81.1 mg/dL   GFR, Estimated >91 >47 mL/min   Anion gap 6 5 - 15  hCG, quantitative, pregnancy   Collection Time: 06/22/23  5:49 PM  Result Value Ref Range   hCG, Beta Chain, Quant, S 2,323 (H) <5 mIU/mL  Type and screen [facility]   Collection Time: 06/22/23  5:49 PM  Result Value Ref Range   ABO/RH(D) B POS    Antibody Screen NEG    Sample Expiration      06/25/2023,2359 Performed at [facility], [address], [place], [place] [address]   Urinalysis, Routine w reflex microscopic -Urine, Clean Catch   Collection Time: 06/22/23 10:32 PM  Result Value Ref Range   Color, Urine AMBER (A) YELLOW   APPearance CLOUDY (A) CLEAR   Specific Gravity, Urine 1.028 1.005 - 1.030   pH 5.0 5.0 - 8.0   Glucose, UA NEGATIVE NEGATIVE mg/dL   Hgb urine dipstick LARGE (A) NEGATIVE   Bilirubin Urine NEGATIVE NEGATIVE   Ketones, ur NEGATIVE NEGATIVE mg/dL   Protein, ur 213 (A) NEGATIVE mg/dL   Nitrite NEGATIVE NEGATIVE   Leukocytes,Ua SMALL (A) NEGATIVE   RBC / HPF >50 0 - 5 RBC/hpf   WBC, UA 0-5 0 - 5 WBC/hpf   Bacteria, UA RARE (A) NONE SEEN   Squamous Epithelial / HPF 0-5 0 - 5 /HPF   Mucus PRESENT    Hyaline Casts, UA PRESENT        07/26/2023   10:14 AM 06/20/2023    8:41 AM 02/23/2023    8:51 AM 11/28/2022   10:29 AM 05/29/2022    9:59 AM  Depression screen PHQ 2/9  Decreased Interest 0 0 0 1 1  Down,  Depressed, Hopeless 0 0 0 0 1  PHQ - 2 Score 0 0 0 1 2  Altered sleeping 0 0 1 0 0  Tired, decreased energy 0 0 1 1 1   Change in appetite 0 0 0 1 1  Feeling bad or failure about yourself  0 0 0 0 1  Trouble concentrating 0 0 1 3 0  Moving slowly or fidgety/restless 0 0 0 2 0  Suicidal thoughts 0 0 0 0 0  PHQ-9 Score  0 0 3 8 5   Difficult doing work/chores Not difficult at all Not difficult at all  Extremely dIfficult Not difficult at all       06/20/2023    8:42 AM 02/23/2023    8:51 AM 11/28/2022   10:29 AM 05/29/2022   10:00 AM  GAD 7 : Generalized Anxiety Score  Nervous, Anxious, on Edge 0 0 0 1  Control/stop worrying 0 0 0 1  Worry too much - different things 0 0 0 2  Trouble relaxing 0 0 2 1  Restless 0 0 0 1  Easily annoyed or irritable 0 0 0 0  Afraid - awful might happen 0 0 0 1  Total GAD 7 Score 0 0 2 7  Anxiety Difficulty Not difficult at all Not difficult at all Not difficult at all Not difficult at all   Pertinent labs & imaging results that were available during my care of the patient were reviewed by me and considered in my medical decision making.  Assessment & Plan:  Myka was seen today for sore throat.  Diagnoses and all orders for this visit:  Sore throat Rapid strep negative. Communicated to patient.  Additional labs as below. Will communicate results to patient once available. Will await results to determine next steps. Discussed supportive treatments to continue at home. Can consider abx if all viral tests are negative.  -     Rapid Strep Screen ([facility] ONLY); Future -     Culture, Group A Strep; Future -     Rapid Strep Screen ([facility] ONLY) -     Culture, Group A Strep -     CBC with Differential/Platelet -     CMP14+EGFR -     TSH -     Mononucleosis Test, Qual W/ Reflex  Flu-like symptoms As above.  -     COVID-19, Flu A+B and RSV  Rash As above.  -     Mononucleosis Test, Qual W/ Reflex   Continue all other maintenance  medications.  Follow up plan: Return if symptoms worsen or fail to improve.   Continue healthy lifestyle choices, including diet (rich in fruits, vegetables, and lean proteins, and low in salt and simple carbohydrates) and exercise (at least 30 minutes of moderate physical activity daily).  Written and verbal instructions provided   The above assessment and management plan was discussed with the patient. The patient verbalized understanding of and has agreed to the management plan. Patient is aware to call the clinic if they develop any new symptoms or if symptoms persist or worsen. Patient is aware when to return to the clinic for a follow-up visit. Patient educated on when it is appropriate to go to the emergency department.   Neale Burly, DNP-FNP [facility] [address] [place], [place] [address] [phone]

## 2023-07-27 LAB — COVID-19, FLU A+B AND RSV
Influenza A, NAA: NOT DETECTED
Influenza B, NAA: NOT DETECTED
RSV, NAA: NOT DETECTED
SARS-CoV-2, NAA: NOT DETECTED

## 2023-07-27 LAB — CMP14+EGFR
ALT: 8 [IU]/L (ref 0–32)
AST: 11 [IU]/L (ref 0–40)
Albumin: 4.5 g/dL (ref 4.0–5.0)
Alkaline Phosphatase: 54 [IU]/L (ref 44–121)
BUN/Creatinine Ratio: 20 (ref 9–23)
BUN: 14 mg/dL (ref 6–20)
Bilirubin Total: 0.8 mg/dL (ref 0.0–1.2)
CO2: 22 mmol/L (ref 20–29)
Calcium: 9.6 mg/dL (ref 8.7–10.2)
Chloride: 104 mmol/L (ref 96–106)
Creatinine, Ser: 0.7 mg/dL (ref 0.57–1.00)
Globulin, Total: 2.1 g/dL (ref 1.5–4.5)
Glucose: 88 mg/dL (ref 70–99)
Potassium: 4.4 mmol/L (ref 3.5–5.2)
Sodium: 140 mmol/L (ref 134–144)
Total Protein: 6.6 g/dL (ref 6.0–8.5)
eGFR: 124 mL/min/{1.73_m2} (ref >59)

## 2023-07-27 LAB — CBC WITH DIFFERENTIAL/PLATELET
Basophils Absolute: 0 10*3/uL (ref 0.0–0.2)
Basos: 0 %
EOS (ABSOLUTE): 0.1 10*3/uL (ref 0.0–0.4)
Eos: 2 %
Hematocrit: 39.7 % (ref 34.0–46.6)
Hemoglobin: 13 g/dL (ref 11.1–15.9)
Immature Grans (Abs): 0 10*3/uL (ref 0.0–0.1)
Immature Granulocytes: 1 %
Lymphocytes Absolute: 2.2 10*3/uL (ref 0.7–3.1)
Lymphs: 39 %
MCH: 29.2 pg (ref 26.6–33.0)
MCHC: 32.7 g/dL (ref 31.5–35.7)
MCV: 89 fL (ref 79–97)
Monocytes Absolute: 0.5 10*3/uL (ref 0.1–0.9)
Monocytes: 8 %
Neutrophils Absolute: 2.8 10*3/uL (ref 1.4–7.0)
Neutrophils: 50 %
Platelets: 174 10*3/uL (ref 150–450)
RBC: 4.45 x10E6/uL (ref 3.77–5.28)
RDW: 12.6 % (ref 11.7–15.4)
WBC: 5.7 10*3/uL (ref 3.4–10.8)

## 2023-07-27 LAB — TSH: TSH: 2.02 u[IU]/mL (ref 0.450–4.500)

## 2023-07-27 LAB — MONO QUAL W/RFLX QN: Mono Qual W/Rflx Qn: NEGATIVE

## 2023-07-27 NOTE — Progress Notes (Signed)
All labs normal. Recommend patient continue supportive treatments at home and follow up if symptoms continue.

## 2023-07-29 LAB — CULTURE, GROUP A STREP: Strep A Culture: NEGATIVE

## 2023-07-30 NOTE — Progress Notes (Signed)
Negative viral panel and strep culture. If symptoms continue, please follow up.

## 2023-08-06 ENCOUNTER — Encounter: Payer: BC Managed Care – PPO | Attending: Family Medicine | Admitting: Nutrition

## 2023-08-06 VITALS — Ht 63.0 in | Wt 157.0 lb

## 2023-08-06 DIAGNOSIS — F5025 Bulimia nervosa, in remission: Secondary | ICD-10-CM | POA: Diagnosis not present

## 2023-08-06 DIAGNOSIS — Z8659 Personal history of other mental and behavioral disorders: Secondary | ICD-10-CM | POA: Insufficient documentation

## 2023-08-06 NOTE — Patient Instructions (Signed)
Goals  Will work on trying to eat 3 meals per day at the times we talked about. Increase plant based protein with lentils, bean, and nuts/seeds. Cut out gluten and sugars.

## 2023-08-06 NOTE — Progress Notes (Unsigned)
Medical Nutrition Therapy  Appointment Start time:  1400  Appointment End time:  1500  Primary concerns today: HELLP  Referral diagnosis: *** Preferred learning style: NO Preference  Learning readiness: Ready    NUTRITION ASSESSMENT  Wt history; 1 pregnancy 117 lbs, Second pregnancy 115 lbs. [phone] Highest weight non pregnacy 217 in March 2024.  Down to 157 lbs currently  Has been avoiding gluten to avoiding, diarrhea, nauseas, constipation, stomach pains. Walk 3-4 times per day Only drinking water and cut out sodas. No more binge eating.  BInge eating started in 2023.   Clinical Medical Hx:  Past Medical History:  Diagnosis Date   Alteration consciousness 10/07/2019   Anxiety 03/01/2022   Anxiety and depression    Depression, recurrent (HCC) 03/01/2022   Facial abscess 01/02/2015   HELLP (hemolytic anemia/elev liver enzymes/low platelets in pregnancy) 03/31/2019   History of dental surgery-wisdom teeth extraction 01/03/2015   Labor and delivery, indication for care 03/29/2019   Ovarian cyst    Pregnancy induced hypertension    Pregnant 06/01/2023   PTSD (post-traumatic stress disorder)    Recurrent tonsillitis 09/2017   current antibiotic, will finish 09/25/2017   Seizure-like activity (HCC)    Suicide ideation 01/13/2022    Medications:  Current Outpatient Medications on File Prior to Visit  Medication Sig Dispense Refill   atomoxetine (STRATTERA) 80 MG capsule Take 1 capsule (80 mg total) by mouth daily. 90 capsule 0   ondansetron (ZOFRAN) 4 MG tablet Take 1 tablet (4 mg total) by mouth every 6 (six) hours. 12 tablet 0   Prenatal Vit-Fe Fumarate-FA (PRENATAL MULTIVITAMIN) TABS tablet Take 1 tablet by mouth daily at 12 noon.     sertraline (ZOLOFT) 25 MG tablet Take 1.5 tablets once daily. 45 tablet 2   No current facility-administered medications on file prior to visit.    Labs:  Lab Results  Component Value Date   HGBA1C 5.1 12/12/2022      Latest  Ref Rng & Units 07/26/2023   10:29 AM 06/22/2023    5:49 PM 02/23/2023    9:21 AM  CMP  Glucose 70 - 99 mg/dL 88  74  85   BUN 6 - 20 mg/dL 14  10  12    Creatinine 0.57 - 1.00 mg/dL 6.29  5.28  4.13   Sodium 134 - 144 mmol/L 140  140  141   Potassium 3.5 - 5.2 mmol/L 4.4  3.9  4.4   Chloride 96 - 106 mmol/L 104  108  101   CO2 20 - 29 mmol/L 22  26  25    Calcium 8.7 - 10.2 mg/dL 9.6  9.1  9.9   Total Protein 6.0 - 8.5 g/dL 6.6   6.9   Total Bilirubin 0.0 - 1.2 mg/dL 0.8   0.6   Alkaline Phos 44 - 121 IU/L 54   85   AST 0 - 40 IU/L 11   13   ALT 0 - 32 IU/L 8   21    Lipid Panel     Component Value Date/Time   CHOL 162 12/12/2022 0854   TRIG 133 12/12/2022 0854   HDL 45 12/12/2022 0854   CHOLHDL 3.6 12/12/2022 0854   LDLCALC 93 12/12/2022 0854   LABVLDL 24 12/12/2022 0854    Notable Signs/Symptoms: ***  Lifestyle & Dietary Hx LIves with boyfriend  Estimated daily fluid intake: 80 oz Supplements: MIV  Sleep: Poor- 8 hrs before taking Zoloft Stress / self-care: Loss baby Current  average weekly physical activity: Walking 3-5 times 6 days per week.  24-Hr Dietary Recall First Meal: *** Snack: *** Second Meal: 1  chicken nuggets and green beans and water Snack: *** Third Meal: 12 mignight- mashed potatoes, water Snack: *** Beverages: water  Estimated Energy Needs Calories: *** Carbohydrate: ***g Protein: ***g Fat: ***g   NUTRITION DIAGNOSIS  {CHL AMB NUTRITIONAL DIAGNOSIS:[id number]}   NUTRITION INTERVENTION  Nutrition education (E-1) on the following topics:  ***  Handouts Provided Include  ***  Learning Style & Readiness for Change Teaching method utilized: Visual & Auditory  Demonstrated degree of understanding via: Teach Back  Barriers to learning/adherence to lifestyle change: ***  Goals Established by Pt ***   MONITORING & EVALUATION Dietary intake, weekly physical activity, and *** in ***.  Next Steps  Patient is to ***.

## 2023-08-09 ENCOUNTER — Encounter (HOSPITAL_COMMUNITY): Payer: Self-pay

## 2023-08-09 NOTE — Telephone Encounter (Signed)
Spoke with pt scheduled for 08/23/22 due to her schedule offered tomorrow she declined.

## 2023-08-21 ENCOUNTER — Encounter: Payer: Self-pay | Admitting: Nutrition

## 2023-08-24 ENCOUNTER — Telehealth (INDEPENDENT_AMBULATORY_CARE_PROVIDER_SITE_OTHER): Payer: BC Managed Care – PPO | Admitting: Psychiatry

## 2023-08-24 ENCOUNTER — Encounter (HOSPITAL_COMMUNITY): Payer: Self-pay | Admitting: Psychiatry

## 2023-08-24 DIAGNOSIS — F4001 Agoraphobia with panic disorder: Secondary | ICD-10-CM | POA: Diagnosis not present

## 2023-08-24 DIAGNOSIS — F410 Panic disorder [episodic paroxysmal anxiety] without agoraphobia: Secondary | ICD-10-CM

## 2023-08-24 DIAGNOSIS — F5025 Bulimia nervosa, in remission: Secondary | ICD-10-CM | POA: Diagnosis not present

## 2023-08-24 DIAGNOSIS — F4310 Post-traumatic stress disorder, unspecified: Secondary | ICD-10-CM

## 2023-08-24 DIAGNOSIS — F331 Major depressive disorder, recurrent, moderate: Secondary | ICD-10-CM

## 2023-08-24 DIAGNOSIS — R41840 Attention and concentration deficit: Secondary | ICD-10-CM

## 2023-08-24 DIAGNOSIS — F411 Generalized anxiety disorder: Secondary | ICD-10-CM | POA: Diagnosis not present

## 2023-08-24 MED ORDER — ATOMOXETINE HCL 80 MG PO CAPS: 80.0000 mg | ORAL_CAPSULE | Freq: Every day | ORAL | 1 refills | Status: DC

## 2023-08-24 MED ORDER — ATOMOXETINE HCL 80 MG PO CAPS
80.0000 mg | ORAL_CAPSULE | Freq: Every day | ORAL | 0 refills | Status: DC
Start: 2023-08-24 — End: 2023-08-24

## 2023-08-24 NOTE — Patient Instructions (Signed)
 We did not make any changes today, keep up the good work with self care and getting the meals back to where they were.
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
## 2023-09-04 ENCOUNTER — Encounter: Payer: Self-pay | Admitting: Family Medicine

## 2023-09-04 ENCOUNTER — Ambulatory Visit: Payer: BC Managed Care – PPO | Admitting: Family Medicine

## 2023-09-04 VITALS — BP 117/75 | HR 92 | Temp 98.5°F | Ht 63.0 in | Wt 152.0 lb

## 2023-09-04 DIAGNOSIS — G8929 Other chronic pain: Secondary | ICD-10-CM

## 2023-09-04 DIAGNOSIS — M546 Pain in thoracic spine: Secondary | ICD-10-CM | POA: Diagnosis not present

## 2023-09-04 NOTE — Patient Instructions (Signed)
Eager to [employer]

## 2023-09-04 NOTE — Progress Notes (Signed)
Subjective:  Patient ID: Kristie Crawford, female    DOB: [birth year], 23 y.o.   MRN: [id number]  Patient Care Team: Arrie Senate, FNP as PCP - General (Family Medicine) Clifton Custard as Physician Assistant (Physician Assistant)   Chief Complaint:  discuss breast reduction  HPI: Kristie Crawford is a 23 y.o. female presenting on 09/04/2023 for discuss breast reduction  Breast Reduction Evaluation States that pain is in back of her neck and shoulders due to her breasts. States that her pain started in the 7th grade. States that she there is a size difference in her breasts which does not allow her to wear normal supportive bras, she is limited to a sports bra. Reports that it is difficult to complete activities and exercises at the gym. Reports that the size of her breasts has not changed with weight loss. States that she has pain all the time. States that she has trouble sleeping. Has to sleep on her side, as it is more painful when she is on her back.   Relevant past medical, surgical, family, and social history reviewed and updated as indicated.  Allergies and medications reviewed and updated. Data reviewed: Chart in Epic.   Past Medical History:  Diagnosis Date   Alteration consciousness 10/07/2019   Anxiety 03/01/2022   Anxiety and depression    Depression, recurrent (HCC) 03/01/2022   Facial abscess 01/02/2015   HELLP (hemolytic anemia/elev liver enzymes/low platelets in pregnancy) 03/31/2019   History of dental surgery-wisdom teeth extraction 01/03/2015   Labor and delivery, indication for care 03/29/2019   Ovarian cyst    Pregnancy induced hypertension    Pregnant 06/01/2023   PTSD (post-traumatic stress disorder)    Recurrent tonsillitis 09/2017   current antibiotic, will finish 09/25/2017   Seizure-like activity (HCC)    Suicide ideation 01/13/2022    Past Surgical History:  Procedure Laterality Date   CESAREAN SECTION N/A 03/31/2019    Procedure: CESAREAN SECTION;  Surgeon: Levi Aland, MD;  Location: [facility];  Service: Obstetrics;  Laterality: N/A;   TONSILLECTOMY N/A 10/01/2017   Procedure: TONSILLECTOMY;  Surgeon: Serena Colonel, MD;  Location: [facility];  Service: ENT;  Laterality: N/A;   TONSILLECTOMY     WISDOM TOOTH EXTRACTION  10/2014    Social History   Socioeconomic History   Marital status: Single    Spouse name: Not on file   Number of children: 1   Years of education: 12   Highest education level: GED or equivalent  Occupational History   Occupation: Conservation officer, nature  Tobacco Use   Smoking status: Never   Smokeless tobacco: Never  Vaping Use   Vaping status: Never Used  Substance and Sexual Activity   Alcohol use: Not Currently    Comment: 1-2 on holidays; none since September 2023   Drug use: No    Comment: tried delta 8 vape once   Sexual activity: Yes    Birth control/protection: None  Other Topics Concern   Not on file  Social History Narrative   Lives at home with significant other and son.   Right-handed.   Occasional caffeine.   Social Drivers of Corporate investment banker Strain: Low Risk  (11/28/2022)   Overall Financial Resource Strain (CARDIA)    Difficulty of Paying Living Expenses: Not hard at all  Food Insecurity: No Food Insecurity (11/28/2022)   Hunger Vital Sign    Worried About Running Out of Food in the  Last Year: Never true    Ran Out of Food in the Last Year: Never true  Transportation Needs: No Transportation Needs (11/28/2022)   PRAPARE - Administrator, Civil Service (Medical): No    Lack of Transportation (Non-Medical): No  Physical Activity: Sufficiently Active (11/28/2022)   Exercise Vital Sign    Days of Exercise per Week: 5 days    Minutes of Exercise per Session: 30 min  Stress: No Stress Concern Present (11/28/2022)   [employer] of Occupational Health - Occupational Stress Questionnaire    Feeling of Stress : Not at all   Social Connections: Socially Isolated (11/28/2022)   Social Connection and Isolation Panel [NHANES]    Frequency of Communication with Friends and Family: More than three times a week    Frequency of Social Gatherings with Friends and Family: Once a week    Attends Religious Services: Never    Database administrator or Organizations: No    Attends Engineer, structural: Not on file    Marital Status: Never married  Intimate Partner Violence: Unknown (11/15/2021)   Received from [employer], [facility]   HITS    Physically Hurt: Not on file    Insult or Talk Down To: Not on file    Threaten Physical Harm: Not on file    Scream or Curse: Not on file    Outpatient Encounter Medications as of 09/04/2023  Medication Sig   atomoxetine (STRATTERA) 80 MG capsule Take 1 capsule (80 mg total) by mouth daily.   [DISCONTINUED] Prenatal Vit-Fe Fumarate-FA (PRENATAL MULTIVITAMIN) TABS tablet Take 1 tablet by mouth daily at 12 noon.   No facility-administered encounter medications on file as of 09/04/2023.    Allergies  Allergen Reactions   Dexamethasone Swelling   Nitrofurantoin Other (See Comments)    Unknown   Sulfamethoxazole-Trimethoprim Other (See Comments) and Rash   Cariprazine Other (See Comments)    Seizure   Ciprofloxacin Rash and Hives    Review of Systems As per HPI  Objective:  BP 117/75   Pulse 92   Temp 98.5 F (36.9 C)   Ht 5\' 3"  (1.6 m)   Wt 152 lb (68.9 kg)   LMP 08/31/2023 (Exact Date)   SpO2 97%   BMI 26.93 kg/m    Wt Readings from Last 3 Encounters:  09/04/23 152 lb (68.9 kg)  08/06/23 157 lb (71.2 kg)  07/26/23 158 lb (71.7 kg)    Physical Exam Constitutional:      General: She is awake. She is not in acute distress.    Appearance: Normal appearance. She is well-developed and well-groomed. She is not ill-appearing, toxic-appearing or diaphoretic.  Cardiovascular:     Rate and Rhythm: Normal rate and regular rhythm.     Pulses: Normal  pulses.          Radial pulses are 2+ on the right side and 2+ on the left side.       Posterior tibial pulses are 2+ on the right side and 2+ on the left side.     Heart sounds: Normal heart sounds. No murmur heard.    No gallop.  Pulmonary:     Effort: Pulmonary effort is normal. No respiratory distress.     Breath sounds: Normal breath sounds. No stridor. No wheezing, rhonchi or rales.  Musculoskeletal:     Cervical back: Full passive range of motion without pain and neck supple.     Right lower leg: No  edema.     Left lower leg: No edema.  Skin:    General: Skin is warm.     Capillary Refill: Capillary refill takes less than 2 seconds.     Comments: Indention and erythema present on bilateral shoulders/trapezius due to bra straps  Neurological:     General: No focal deficit present.     Mental Status: She is alert, oriented to person, place, and time and easily aroused. Mental status is at baseline.     GCS: GCS eye subscore is 4. GCS verbal subscore is 5. GCS motor subscore is 6.     Motor: No weakness.  Psychiatric:        Attention and Perception: Attention and perception normal.        Mood and Affect: Mood and affect normal.        Speech: Speech normal.        Behavior: Behavior normal. Behavior is cooperative.        Thought Content: Thought content normal. Thought content does not include homicidal or suicidal ideation. Thought content does not include homicidal or suicidal plan.        Cognition and Memory: Cognition and memory normal.        Judgment: Judgment normal.     Results for orders placed or performed in visit on 07/26/23  Culture, Group A Strep   Collection Time: 07/26/23 10:24 AM   TH  Result Value Ref Range   Strep A Culture Negative        09/04/2023    8:24 AM 08/06/2023    2:11 PM 07/26/2023   10:14 AM 06/20/2023    8:41 AM 02/23/2023    8:51 AM  Depression screen PHQ 2/9  Decreased Interest 0 0 0 0 0  Down, Depressed, Hopeless 0 0 0 0 0   PHQ - 2 Score 0 0 0 0 0  Altered sleeping   0 0 1  Tired, decreased energy   0 0 1  Change in appetite   0 0 0  Feeling bad or failure about yourself    0 0 0  Trouble concentrating   0 0 1  Moving slowly or fidgety/restless   0 0 0  Suicidal thoughts   0 0 0  PHQ-9 Score   0 0 3  Difficult doing work/chores   Not difficult at all Not difficult at all        07/26/2023   10:14 AM 06/20/2023    8:42 AM 02/23/2023    8:51 AM 11/28/2022   10:29 AM  GAD 7 : Generalized Anxiety Score  Nervous, Anxious, on Edge 0 0 0 0  Control/stop worrying 0 0 0 0  Worry too much - different things 0 0 0 0  Trouble relaxing 0 0 0 2  Restless 0 0 0 0  Easily annoyed or irritable 0 0 0 0  Afraid - awful might happen 0 0 0 0  Total GAD 7 Score 0 0 0 2  Anxiety Difficulty Not difficult at all Not difficult at all Not difficult at all Not difficult at all   Pertinent labs & imaging results that were available during my care of the patient were reviewed by me and considered in my medical decision making.  Assessment & Plan:  Kristie Crawford was seen today for discuss breast reduction.  Diagnoses and all orders for this visit:  Chronic midline thoracic back pain Referral placed as below for patient to be evaluated for breast  reduction.  -     Ambulatory referral to Plastic Surgery     Continue all other maintenance medications.  Follow up plan: Return if symptoms worsen or fail to improve.   Continue healthy lifestyle choices, including diet (rich in fruits, vegetables, and lean proteins, and low in salt and simple carbohydrates) and exercise (at least 30 minutes of moderate physical activity daily).  Written and verbal instructions provided   The above assessment and management plan was discussed with the patient. The patient verbalized understanding of and has agreed to the management plan. Patient is aware to call the clinic if they develop any new symptoms or if symptoms persist or worsen. Patient  is aware when to return to the clinic for a follow-up visit. Patient educated on when it is appropriate to go to the emergency department.   Neale Burly, DNP-FNP [facility] [address] [place], [place] [address] [phone]

## 2023-09-13 ENCOUNTER — Telehealth (HOSPITAL_COMMUNITY): Payer: BC Managed Care – PPO | Admitting: Psychiatry

## 2023-09-27 DIAGNOSIS — N760 Acute vaginitis: Secondary | ICD-10-CM | POA: Diagnosis not present

## 2023-09-27 DIAGNOSIS — N898 Other specified noninflammatory disorders of vagina: Secondary | ICD-10-CM | POA: Diagnosis not present

## 2023-10-01 ENCOUNTER — Ambulatory Visit: Payer: BC Managed Care – PPO | Admitting: Nutrition

## 2023-10-01 DIAGNOSIS — R0789 Other chest pain: Secondary | ICD-10-CM | POA: Diagnosis not present

## 2023-10-01 DIAGNOSIS — R062 Wheezing: Secondary | ICD-10-CM | POA: Diagnosis not present

## 2023-10-01 DIAGNOSIS — R079 Chest pain, unspecified: Secondary | ICD-10-CM | POA: Diagnosis not present

## 2023-10-01 DIAGNOSIS — R0981 Nasal congestion: Secondary | ICD-10-CM | POA: Diagnosis not present

## 2023-10-01 DIAGNOSIS — R059 Cough, unspecified: Secondary | ICD-10-CM | POA: Diagnosis not present

## 2023-10-01 DIAGNOSIS — R0602 Shortness of breath: Secondary | ICD-10-CM | POA: Diagnosis not present

## 2023-10-01 DIAGNOSIS — Z881 Allergy status to other antibiotic agents status: Secondary | ICD-10-CM | POA: Diagnosis not present

## 2023-10-01 DIAGNOSIS — J101 Influenza due to other identified influenza virus with other respiratory manifestations: Secondary | ICD-10-CM | POA: Diagnosis not present

## 2023-10-01 DIAGNOSIS — Z20822 Contact with and (suspected) exposure to covid-19: Secondary | ICD-10-CM | POA: Diagnosis not present

## 2023-10-01 DIAGNOSIS — Z882 Allergy status to sulfonamides status: Secondary | ICD-10-CM | POA: Diagnosis not present

## 2023-10-03 ENCOUNTER — Ambulatory Visit: Payer: BC Managed Care – PPO | Admitting: Family Medicine

## 2023-10-04 ENCOUNTER — Institutional Professional Consult (permissible substitution): Payer: BC Managed Care – PPO | Admitting: Plastic Surgery

## 2023-10-28 ENCOUNTER — Encounter (HOSPITAL_COMMUNITY): Payer: Self-pay

## 2023-10-28 ENCOUNTER — Encounter: Payer: Self-pay | Admitting: Family Medicine

## 2023-10-31 ENCOUNTER — Encounter: Payer: Self-pay | Admitting: Plastic Surgery

## 2023-10-31 ENCOUNTER — Ambulatory Visit: Payer: BC Managed Care – PPO | Admitting: Plastic Surgery

## 2023-10-31 VITALS — BP 108/63 | HR 77 | Ht 63.0 in | Wt 143.0 lb

## 2023-10-31 DIAGNOSIS — R21 Rash and other nonspecific skin eruption: Secondary | ICD-10-CM

## 2023-10-31 DIAGNOSIS — M542 Cervicalgia: Secondary | ICD-10-CM | POA: Diagnosis not present

## 2023-10-31 DIAGNOSIS — G8929 Other chronic pain: Secondary | ICD-10-CM

## 2023-10-31 DIAGNOSIS — M25519 Pain in unspecified shoulder: Secondary | ICD-10-CM

## 2023-10-31 DIAGNOSIS — N62 Hypertrophy of breast: Secondary | ICD-10-CM | POA: Diagnosis not present

## 2023-10-31 DIAGNOSIS — M546 Pain in thoracic spine: Secondary | ICD-10-CM

## 2023-10-31 NOTE — Progress Notes (Signed)
 Referring Provider Arrie Senate, FNP [address] [place],  [place] [address]   CC:  Chief Complaint  Patient presents with   Advice Only   Skin Problem      Kristie Crawford Crawford is an 23 y.o. female.  HPI: Kristie Crawford Crawford is a 23 year old female who presents today with complaints of upper back and neck pain which she attributes to large size of her breast.  She has noted that her breasts have been large since she began breast development in the seventh grade she also notes severe rashes and occasionally blistering on the posterior aspect of her breast.  This is worsened with her weight loss.  She has lost 70 pounds through diet and exercise.  Further she notes that her breasts have become very ptotic and very difficult to manage with bras.  Her breasts frequently interfere with her ability to exercise.  She is requesting a bilateral breast reduction.  Allergies  Allergen Reactions   Dexamethasone Swelling   Nitrofurantoin Other (See Comments)    Unknown   Sulfamethoxazole-Trimethoprim Other (See Comments) and Rash   Cariprazine Other (See Comments)    Seizure   Ciprofloxacin Rash and Hives    Outpatient Encounter Medications as of 10/31/2023  Medication Sig   atomoxetine (STRATTERA) 80 MG capsule Take 1 capsule (80 mg total) by mouth daily.   No facility-administered encounter medications on file as of 10/31/2023.     Past Medical History:  Diagnosis Date   Alteration consciousness 10/07/2019   Anxiety 03/01/2022   Anxiety and depression    Depression, recurrent (HCC) 03/01/2022   Facial abscess 01/02/2015   HELLP (hemolytic anemia/elev liver enzymes/low platelets in pregnancy) 03/31/2019   History of dental surgery-wisdom teeth extraction 01/03/2015   Labor and delivery, indication for care 03/29/2019   Ovarian cyst    Pregnancy induced hypertension    Pregnant 06/01/2023   PTSD (post-traumatic stress disorder)    Recurrent tonsillitis 09/2017   current  antibiotic, will finish 09/25/2017   Seizure-like activity (HCC)    Suicide ideation 01/13/2022    Past Surgical History:  Procedure Laterality Date   CESAREAN SECTION N/A 03/31/2019   Procedure: CESAREAN SECTION;  Surgeon: Levi Aland, MD;  Location: [facility];  Service: Obstetrics;  Laterality: N/A;   TONSILLECTOMY N/A 10/01/2017   Procedure: TONSILLECTOMY;  Surgeon: Serena Colonel, MD;  Location: [facility];  Service: ENT;  Laterality: N/A;   TONSILLECTOMY     WISDOM TOOTH EXTRACTION  10/2014    Family History  Problem Relation Age of Onset   Ovarian cancer Mother    Skin cancer Mother        unsure of type - not melanoma   Healthy Father    Cancer Maternal Aunt        skin cancer   Cancer Maternal Uncle        skin cancer   Skin cancer Maternal Grandmother    Stroke Maternal Grandmother    Diabetes Maternal Grandmother    Hypertension Maternal Grandfather    Osteoporosis Maternal Grandfather    Heart attack Maternal Grandfather     Social History   Social History Narrative   Lives at home with significant other and son.   Right-handed.   Occasional caffeine.     Review of Systems General: Denies fevers, chills, weight loss CV: Denies chest pain, shortness of breath, palpitations Breast: Patient denies any specific complaints with her breast other than they are large size  and difficulty finding bras that fit appropriately.  She feels that the size is contributing to upper back and neck pain and notes that her breasts frequently interfere with her ability to exercise  Physical Exam    10/31/2023    9:42 AM 09/04/2023    8:20 AM 08/06/2023    2:06 PM  Vitals with BMI  Height 5\' 3"  5\' 3"  5\' 3"   Weight 143 lbs 152 lbs 157 lbs  BMI 25.34 26.93 27.82  Systolic 108 117   Diastolic 63 75   Pulse 77 92     General:  No acute distress,  Alert and oriented, Non-Toxic, Normal speech and affect Breast: Patient has pendulous breast with grade 3 ptosis.   They are somewhat atrophic and quite ptotic.  There are no dominant masses and the nipples are normal in appearance without evidence of nipple discharge.  Her sternal notch to nipple distance on the right is 28 cm and 29 cm on the left her fold to nipple distance on the right is 14 cm and 12 cm on the left Mammogram: Not applicable due to age Assessment/Plan Macromastia: Patient complains of upper back and neck pain as well as rashes due to her breast.  She is requesting a breast reduction.  I believe that I can remove between 200 g and 300 g per breast.  I discussed breast reductions at length with the patient including showing her the location of the incisions.  We discussed the unpredictable nature of scarring and wound healing.  We discussed the risks of bleeding, infection, and seroma formation.  She understands I will use drains postoperatively.  She understands that she will need to wear a supportive compressive garment for 6 weeks.  We did discuss the risk of nipple loss due to nipple ischemia.  We also discussed the fact that if she decides to become pregnant in the future that it may be difficult for her to breast-feed after breast reduction.  Her breast may also enlarge to the point that they are now.  We discussed the postoperative limitations including no heavy lifting greater than 20 pounds, no vigorous activity, no submerging the incisions in water for 6 weeks.  We discussed the importance of early ambulation after surgery to help prevent DVT and we discussed the fact that she may return to light activity as tolerated.  All questions were answered to her satisfaction.  Photographs were obtained today with her consent.  Will submit her for a bilateral breast reduction at her request.  Santiago Glad 10/31/2023, 12:45 PM

## 2023-11-07 ENCOUNTER — Ambulatory Visit: Admitting: Family Medicine

## 2023-11-08 DIAGNOSIS — Z3201 Encounter for pregnancy test, result positive: Secondary | ICD-10-CM | POA: Diagnosis not present

## 2023-11-08 DIAGNOSIS — Z130 Encounter for screening for diseases of the blood and blood-forming organs and certain disorders involving the immune mechanism: Secondary | ICD-10-CM | POA: Diagnosis not present

## 2023-11-12 DIAGNOSIS — Z3201 Encounter for pregnancy test, result positive: Secondary | ICD-10-CM | POA: Diagnosis not present

## 2023-11-23 DIAGNOSIS — R102 Pelvic and perineal pain: Secondary | ICD-10-CM | POA: Diagnosis not present

## 2023-11-23 DIAGNOSIS — A6009 Herpesviral infection of other urogenital tract: Secondary | ICD-10-CM | POA: Diagnosis not present

## 2023-11-23 DIAGNOSIS — N898 Other specified noninflammatory disorders of vagina: Secondary | ICD-10-CM | POA: Diagnosis not present

## 2023-11-23 DIAGNOSIS — N939 Abnormal uterine and vaginal bleeding, unspecified: Secondary | ICD-10-CM | POA: Diagnosis not present

## 2023-11-23 DIAGNOSIS — A6000 Herpesviral infection of urogenital system, unspecified: Secondary | ICD-10-CM | POA: Diagnosis not present

## 2023-12-06 ENCOUNTER — Ambulatory Visit: Admitting: Family Medicine

## 2023-12-06 ENCOUNTER — Encounter: Payer: Self-pay | Admitting: Family Medicine

## 2023-12-06 VITALS — BP 98/61 | HR 83 | Temp 98.7°F | Ht 63.0 in | Wt 137.0 lb

## 2023-12-06 DIAGNOSIS — R300 Dysuria: Secondary | ICD-10-CM

## 2023-12-06 LAB — URINALYSIS, ROUTINE W REFLEX MICROSCOPIC
Bilirubin, UA: NEGATIVE
Glucose, UA: NEGATIVE
Nitrite, UA: POSITIVE — AB
Specific Gravity, UA: 1.025 (ref 1.005–1.030)
Urobilinogen, Ur: 0.2 mg/dL (ref 0.2–1.0)
pH, UA: 6 (ref 5.0–7.5)

## 2023-12-06 LAB — MICROSCOPIC EXAMINATION
RBC, Urine: >30 /HPF — AB (ref 0–2)
WBC, UA: >30 /HPF — AB (ref 0–5)
Yeast, UA: NONE SEEN

## 2023-12-06 MED ORDER — NITROFURANTOIN MONOHYD MACRO 100 MG PO CAPS: 100.0000 mg | ORAL_CAPSULE | Freq: Two times a day (BID) | ORAL | 0 refills | Status: DC

## 2023-12-06 NOTE — Progress Notes (Signed)
 Subjective:  Patient ID: Kristie Crawford, female    DOB: 05-27-2001, 23 y.o.   MRN: [id number]  Patient Care Team: Chrystine Crate, FNP as PCP - General (Family Medicine) Iantha Mainland as Physician Assistant (Physician Assistant)   Chief Complaint:  No chief complaint on file.  HPI: Kristie Crawford is a 23 y.o. female presenting on 12/06/2023 for No chief complaint on file.  Urinary Tract Infection  This is a new problem. The current episode started in the past 7 days. The problem has been gradually worsening. The quality of the pain is described as burning. The pain is mild. There has been no fever. She is Sexually active. There is No history of pyelonephritis. Associated symptoms include chills and frequency. Pertinent negatives include no discharge, flank pain, hematuria, hesitancy, nausea, sweats, urgency or vomiting. Associated symptoms comments: Ab pain . She has tried home medications and increased fluids (AZO and cranberry) for the symptoms. Her past medical history is significant for recurrent UTIs.    Relevant past medical, surgical, family, and social history reviewed and updated as indicated.  Allergies and medications reviewed and updated. Data reviewed: Chart in Epic.   Past Medical History:  Diagnosis Date   Alteration consciousness 10/07/2019   Anxiety 03/01/2022   Anxiety and depression    Depression, recurrent (HCC) 03/01/2022   Facial abscess 01/02/2015   HELLP (hemolytic anemia/elev liver enzymes/low platelets in pregnancy) 03/31/2019   History of dental surgery-wisdom teeth extraction 01/03/2015   Labor and delivery, indication for care 03/29/2019   Ovarian cyst    Pregnancy induced hypertension    Pregnant 06/01/2023   PTSD (post-traumatic stress disorder)    Recurrent tonsillitis 09/2017   current antibiotic, will finish 09/25/2017   Seizure-like activity (HCC)    Suicide ideation 01/13/2022    Past Surgical History:   Procedure Laterality Date   CESAREAN SECTION N/A 03/31/2019   Procedure: CESAREAN SECTION;  Surgeon: Hamp Levine, MD;  Location: [facility];  Service: Obstetrics;  Laterality: N/A;   TONSILLECTOMY N/A 10/01/2017   Procedure: TONSILLECTOMY;  Surgeon: Janita Mellow, MD;  Location: [facility];  Service: ENT;  Laterality: N/A;   TONSILLECTOMY     WISDOM TOOTH EXTRACTION  10/2014    Social History   Socioeconomic History   Marital status: Single    Spouse name: Not on file   Number of children: 1   Years of education: 12   Highest education level: GED or equivalent  Occupational History   Occupation: Conservation officer, nature  Tobacco Use   Smoking status: Never   Smokeless tobacco: Never  Vaping Use   Vaping status: Never Used  Substance and Sexual Activity   Alcohol use: Not Currently    Comment: 1-2 on holidays; none since September 2023   Drug use: No    Comment: tried delta 8 vape once   Sexual activity: Yes    Birth control/protection: None  Other Topics Concern   Not on file  Social History Narrative   Lives at home with significant other and son.   Right-handed.   Occasional caffeine .   Social Drivers of Corporate investment banker Strain: Low Risk  (12/05/2023)   Overall Financial Resource Strain (CARDIA)    Difficulty of Paying Living Expenses: Not hard at all  Food Insecurity: No Food Insecurity (12/05/2023)   Hunger Vital Sign    Worried About Running Out of Food in the Last Year: Never true  Ran Out of Food in the Last Year: Never true  Transportation Needs: No Transportation Needs (12/05/2023)   PRAPARE - Administrator, Civil Service (Medical): No    Lack of Transportation (Non-Medical): No  Physical Activity: Sufficiently Active (12/05/2023)   Exercise Vital Sign    Days of Exercise per Week: 7 days    Minutes of Exercise per Session: 150+ min  Stress: No Stress Concern Present (12/05/2023)   [employer] of Occupational Health -  Occupational Stress Questionnaire    Feeling of Stress : Not at all  Social Connections: Moderately Integrated (12/05/2023)   Social Connection and Isolation Panel [NHANES]    Frequency of Communication with Friends and Family: More than three times a week    Frequency of Social Gatherings with Friends and Family: Three times a week    Attends Religious Services: 1 to 4 times per year    Active Member of Clubs or Organizations: No    Attends Banker Meetings: Not on file    Marital Status: Living with partner  Intimate Partner Violence: Unknown (11/15/2021)   [facility] from [facility]   HITS    Physically Hurt: Not on file    Insult or Talk Down To: Not on file    Threaten Physical Harm: Not on file    Scream or Curse: Not on file    Outpatient Encounter Medications as of 12/06/2023  Medication Sig   atomoxetine  (STRATTERA ) 80 MG capsule Take 1 capsule (80 mg total) by mouth daily.   No facility-administered encounter medications on file as of 12/06/2023.    Allergies  Allergen Reactions   Misoprostol  Rash   Review of Systems  Constitutional:  Positive for chills.  Gastrointestinal:  Negative for nausea and vomiting.  Genitourinary:  Positive for frequency. Negative for flank pain, hematuria, hesitancy and urgency.   Objective:  BP 98/61   Pulse 83   Temp 98.7 F (37.1 C)   Ht 5\' 3"  (1.6 m)   Wt 137 lb (62.1 kg)   LMP 10/09/2023   SpO2 99%   BMI 24.27 kg/m    Wt Readings from Last 3 Encounters:  12/06/23 137 lb (62.1 kg)  10/31/23 143 lb (64.9 kg)  09/04/23 152 lb (68.9 kg)   Physical Exam Constitutional:      General: She is awake. She is not in acute distress.    Appearance: Normal appearance. She is well-developed and well-groomed. She is not ill-appearing, toxic-appearing or diaphoretic.  Cardiovascular:     Rate and Rhythm: Normal rate and regular rhythm.     Heart sounds: Normal heart sounds. No murmur heard.    No gallop.   Pulmonary:     Effort: Pulmonary effort is normal. No respiratory distress.     Breath sounds: Normal breath sounds. No stridor. No wheezing, rhonchi or rales.  Abdominal:     General: Abdomen is flat. Bowel sounds are normal.     Palpations: Abdomen is soft.     Tenderness: There is no abdominal tenderness. There is no right CVA tenderness or left CVA tenderness.  Musculoskeletal:     Cervical back: Full passive range of motion without pain and neck supple.  Skin:    General: Skin is warm.     Capillary Refill: Capillary refill takes less than 2 seconds.  Neurological:     General: No focal deficit present.     Mental Status: She is alert, oriented to person, place, and time  and easily aroused. Mental status is at baseline.     GCS: GCS eye subscore is 4. GCS verbal subscore is 5. GCS motor subscore is 6.     Motor: No weakness.  Psychiatric:        Attention and Perception: Attention and perception normal.        Mood and Affect: Mood and affect normal.        Speech: Speech normal.        Behavior: Behavior normal. Behavior is cooperative.        Thought Content: Thought content normal. Thought content does not include homicidal or suicidal ideation. Thought content does not include homicidal or suicidal plan.        Cognition and Memory: Cognition and memory normal.        Judgment: Judgment normal.      Results for orders placed or performed in visit on 07/26/23  Culture, Group A Strep   Collection Time: 07/26/23 10:24 AM   TH  Result Value Ref Range   Strep A Culture Negative        12/06/2023    9:45 AM 09/04/2023    8:24 AM 08/06/2023    2:11 PM 07/26/2023   10:14 AM 06/20/2023    8:41 AM  Depression screen PHQ 2/9  Decreased Interest 0 0 0 0 0  Down, Depressed, Hopeless 0 0 0 0 0  PHQ - 2 Score 0 0 0 0 0  Altered sleeping    0 0  Tired, decreased energy    0 0  Change in appetite    0 0  Feeling bad or failure about yourself     0 0  Trouble concentrating     0 0  Moving slowly or fidgety/restless    0 0  Suicidal thoughts    0 0  PHQ-9 Score    0 0  Difficult doing work/chores    Not difficult at all Not difficult at all       12/06/2023    9:45 AM 09/04/2023    8:24 AM 07/26/2023   10:14 AM 06/20/2023    8:42 AM  GAD 7 : Generalized Anxiety Score  Nervous, Anxious, on Edge 0 0 0 0  Control/stop worrying 0 0 0 0  Worry too much - different things 0 0 0 0  Trouble relaxing 0 0 0 0  Restless 0 0 0 0  Easily annoyed or irritable 0 0 0 0  Afraid - awful might happen  0 0 0  Total GAD 7 Score  0 0 0  Anxiety Difficulty Not difficult at all Not difficult at all Not difficult at all Not difficult at all      Pertinent labs & imaging results that were available during my care of the patient were reviewed by me and considered in my medical decision making.  Assessment & Plan:  Diagnoses and all orders for this visit:  Dysuria Based on labs, will send in medication as below and will await culture results. Discussed monitoring.  -     Urinalysis, Routine w reflex microscopic -     Urine Culture -     nitrofurantoin , macrocrystal-monohydrate, (MACROBID ) 100 MG capsule; Take 1 capsule (100 mg total) by mouth 2 (two) times daily for 5 days.    Continue all other maintenance medications.  Follow up plan: Return if symptoms worsen or fail to improve.  Continue healthy lifestyle choices, including diet (rich in fruits, vegetables, and lean  proteins, and low in salt and simple carbohydrates) and exercise (at least 30 minutes of moderate physical activity daily).  Written and verbal instructions provided   The above assessment and management plan was discussed with the patient. The patient verbalized understanding of and has agreed to the management plan. Patient is aware to call the clinic if they develop Kristie new symptoms or if symptoms persist or worsen. Patient is aware when to return to the clinic for a follow-up visit. Patient educated on  when it is appropriate to go to the emergency department.   Jacqualyn Mates, DNP-FNP [facility] [address] [place], [place] [address] [phone]

## 2023-12-09 LAB — URINE CULTURE

## 2023-12-10 ENCOUNTER — Encounter: Payer: Self-pay | Admitting: Family Medicine

## 2023-12-10 ENCOUNTER — Other Ambulatory Visit: Payer: Self-pay | Admitting: Family Medicine

## 2023-12-10 DIAGNOSIS — R300 Dysuria: Secondary | ICD-10-CM

## 2023-12-10 MED ORDER — NITROFURANTOIN MONOHYD MACRO 100 MG PO CAPS: 100.0000 mg | ORAL_CAPSULE | Freq: Two times a day (BID) | ORAL | 0 refills | Status: AC

## 2023-12-11 ENCOUNTER — Encounter: Payer: Self-pay | Admitting: Family Medicine

## 2023-12-11 NOTE — Progress Notes (Signed)
 I see where patient was still having symptoms and received additional antibiotics. Based on culture, she is receiving appropriate antibiotics. If she is still not improving, would like for her to follow up in person.

## 2023-12-14 ENCOUNTER — Encounter: Payer: Self-pay | Admitting: Family Medicine

## 2023-12-19 ENCOUNTER — Ambulatory Visit: Admitting: Family Medicine

## 2023-12-19 DIAGNOSIS — N3001 Acute cystitis with hematuria: Secondary | ICD-10-CM | POA: Diagnosis not present

## 2023-12-19 DIAGNOSIS — Z3A01 Less than 8 weeks gestation of pregnancy: Secondary | ICD-10-CM | POA: Diagnosis not present

## 2023-12-19 DIAGNOSIS — R300 Dysuria: Secondary | ICD-10-CM | POA: Diagnosis not present

## 2024-01-04 ENCOUNTER — Ambulatory Visit: Admitting: Family Medicine

## 2024-01-10 ENCOUNTER — Telehealth (HOSPITAL_COMMUNITY): Payer: Self-pay | Admitting: *Deleted

## 2024-01-10 NOTE — Telephone Encounter (Signed)
 Opened in Error.

## 2024-01-11 ENCOUNTER — Ambulatory Visit: Admitting: Family Medicine

## 2024-01-23 ENCOUNTER — Emergency Department (HOSPITAL_COMMUNITY)
Admission: EM | Admit: 2024-01-23 | Discharge: 2024-01-24 | Disposition: A | Discharge status: Home / Self Care | Attending: Emergency Medicine | Admitting: Emergency Medicine

## 2024-01-23 ENCOUNTER — Other Ambulatory Visit: Payer: Self-pay

## 2024-01-23 ENCOUNTER — Encounter (HOSPITAL_COMMUNITY): Payer: Self-pay

## 2024-01-23 DIAGNOSIS — Z3A01 Less than 8 weeks gestation of pregnancy: Secondary | ICD-10-CM | POA: Diagnosis not present

## 2024-01-23 DIAGNOSIS — Z3A00 Weeks of gestation of pregnancy not specified: Secondary | ICD-10-CM | POA: Insufficient documentation

## 2024-01-23 DIAGNOSIS — O209 Hemorrhage in early pregnancy, unspecified: Secondary | ICD-10-CM | POA: Insufficient documentation

## 2024-01-23 NOTE — ED Triage Notes (Signed)
 Pt to ED from home, reports being pregnant around 4 weeks, started vaginal bleeding on may 27th bright red with clots, then turning brown, now back to red with abd cramping and back pain, pt says blood is minimal. Pt also reports she had abortion April 1st, stopped bleeding from that the 14th of April and confirmed on another new pregnancy on may 7th

## 2024-01-24 LAB — CBC
HCT: 34.9 % — ABNORMAL LOW (ref 36.0–46.0)
Hemoglobin: 11.4 g/dL — ABNORMAL LOW (ref 12.0–15.0)
MCH: 27.7 pg (ref 26.0–34.0)
MCHC: 32.7 g/dL (ref 30.0–36.0)
MCV: 84.9 fL (ref 80.0–100.0)
Platelets: 196 10*3/uL (ref 150–400)
RBC: 4.11 MIL/uL (ref 3.87–5.11)
RDW: 13.2 % (ref 11.5–15.5)
WBC: 5.8 10*3/uL (ref 4.0–10.5)
nRBC: 0 % (ref 0.0–0.2)

## 2024-01-24 LAB — ABO/RH: ABO/RH(D): B POS

## 2024-01-24 LAB — HCG, QUANTITATIVE, PREGNANCY: hCG, Beta Chain, Quant, S: 62 m[IU]/mL — ABNORMAL HIGH (ref <5)

## 2024-01-24 LAB — WET PREP, GENITAL
Clue Cells Wet Prep HPF POC: NONE SEEN
Sperm: NONE SEEN
Trich, Wet Prep: NONE SEEN
WBC, Wet Prep HPF POC: <10 (ref <10)
Yeast Wet Prep HPF POC: NONE SEEN

## 2024-01-24 NOTE — ED Notes (Signed)
Patient verbalizes understanding of discharge instructions. Opportunity for questioning and answers were provided. Armband removed by staff, pt discharged from ED. Ambulated out to lobby  

## 2024-01-24 NOTE — ED Provider Notes (Signed)
 [place] EMERGENCY DEPARTMENT AT [facility] Provider Note   CSN: [id number] Arrival date & time: 01/23/24  2318     History  Chief Complaint  Patient presents with   Threatened Miscarriage    Kristie Crawford is a 23 y.o. female.  The history is provided by the patient.  Patient with complicated obstetric history presents for threatened miscarriage Patient reports that she is a G5, P2 in her first trimester.  She reports last November she had a miscarriage.  This previous March she had a medication induced abortion that was finalized in early April. She reports last month in early May she found out she was pregnant.  Since then she has had intermittent vaginal bleeding.  Tonight she had some abdominal pressure and small bout of brownish and bloody discharge.  No fevers or vomiting.    Home Medications Prior to Admission medications   Medication Sig Start Date End Date Taking? Authorizing Provider  atomoxetine  (STRATTERA ) 80 MG capsule Take 1 capsule (80 mg total) by mouth daily. 08/24/23 02/20/24  Madie Schilling, MD      Allergies    Misoprostol     Review of Systems   Review of Systems  Constitutional:  Negative for fever.  Genitourinary:  Positive for vaginal bleeding.    Physical Exam Updated Vital Signs BP 119/66 (BP Location: Right Arm)   Pulse 98   Temp 98.4 F (36.9 C) (Oral)   Resp 17   Ht 1.575 m (5' 2)   Wt 56.7 kg   LMP 11/13/2023   SpO2 100%   BMI 22.86 kg/m  Physical Exam CONSTITUTIONAL: Well developed/well nourished HEAD: Normocephalic/atraumatic ENMT: Mucous membranes moist NECK: supple no meningeal signs CV: S1/S2 noted, no murmurs/rubs/gallops noted LUNGS: Lungs are clear to auscultation bilaterally, no apparent distress ABDOMEN: soft, nontender, no rebound or guarding, bowel sounds noted throughout abdomen GU:no cva tenderness Pelvic assisted by nurse Christine Cozier amount of brownish discharge.  No vaginal bleeding.  No  lacerations, no products of conception, no CMT NEURO: Pt is awake/alert/appropriate, moves all extremitiesx4.  No facial droop.   SKIN: warm, color normal   ED Results / Procedures / Treatments   Labs (all labs ordered are listed, but only abnormal results are displayed) Labs Reviewed  CBC - Abnormal; Notable for the following components:      Result Value   Hemoglobin 11.4 (*)    HCT 34.9 (*)    All other components within normal limits  HCG, QUANTITATIVE, PREGNANCY - Abnormal; Notable for the following components:   hCG, Beta Chain, Quant, S 62 (*)    All other components within normal limits  WET PREP, GENITAL  ABO/RH  GC/CHLAMYDIA PROBE AMP ([place]) NOT AT [facility]    EKG None  Radiology No results found.  Procedures Procedures    Medications Ordered in ED Medications - No data to display  ED Course/ Medical Decision Making/ A&P Clinical Course as of 01/24/24 0328  Thu Jan 24, 2024  0327 Patient with complicated OB history presents for brownish and bloody discharge.  She reports abdominal pressure but no other acute distress.  No obvious previous history of ectopic pregnancy. Overall pelvic exam was unremarkable.  Quant is less than 100.  I suspect that this is a near complete miscarriage.  She is in no acute distress, she is afebrile and not septic appearing. She already has OB follow-up in the next 10 days.  I have low suspicion for acute ectopic pregnancy or  other obstetric emergency  Discussed strict return precautions with patient [DW]    Clinical Course User Index [DW] Eldon Greenland, MD                                 Medical Decision Making Amount and/or Complexity of Data Reviewed Labs: ordered.           Final Clinical Impression(s) / ED Diagnoses Final diagnoses:  Vaginal bleeding in pregnancy, first trimester    Rx / DC Orders ED Discharge Orders     None         Eldon Greenland, MD 01/24/24 [phone]

## 2024-01-25 LAB — GC/CHLAMYDIA PROBE AMP (~~LOC~~) NOT AT ARMC
Chlamydia: NEGATIVE
Comment: NEGATIVE
Comment: NORMAL
Neisseria Gonorrhea: NEGATIVE

## 2024-02-25 ENCOUNTER — Telehealth (HOSPITAL_COMMUNITY): Payer: BC Managed Care – PPO | Admitting: Psychiatry

## 2024-03-18 DIAGNOSIS — R350 Frequency of micturition: Secondary | ICD-10-CM | POA: Diagnosis not present

## 2024-03-18 DIAGNOSIS — N3000 Acute cystitis without hematuria: Secondary | ICD-10-CM | POA: Diagnosis not present

## 2024-03-19 ENCOUNTER — Other Ambulatory Visit: Payer: Self-pay | Admitting: Medical Genetics

## 2024-03-20 ENCOUNTER — Ambulatory Visit: Admitting: Family Medicine

## 2024-03-26 DIAGNOSIS — Z888 Allergy status to other drugs, medicaments and biological substances status: Secondary | ICD-10-CM | POA: Diagnosis not present

## 2024-03-26 DIAGNOSIS — M545 Low back pain, unspecified: Secondary | ICD-10-CM | POA: Diagnosis not present

## 2024-03-26 DIAGNOSIS — N390 Urinary tract infection, site not specified: Secondary | ICD-10-CM | POA: Diagnosis not present

## 2024-03-26 DIAGNOSIS — R1031 Right lower quadrant pain: Secondary | ICD-10-CM | POA: Diagnosis not present

## 2024-03-26 DIAGNOSIS — Z881 Allergy status to other antibiotic agents status: Secondary | ICD-10-CM | POA: Diagnosis not present

## 2024-03-26 DIAGNOSIS — K760 Fatty (change of) liver, not elsewhere classified: Secondary | ICD-10-CM | POA: Diagnosis not present

## 2024-03-26 DIAGNOSIS — R1032 Left lower quadrant pain: Secondary | ICD-10-CM | POA: Diagnosis not present

## 2024-03-26 DIAGNOSIS — R300 Dysuria: Secondary | ICD-10-CM | POA: Diagnosis not present

## 2024-03-26 LAB — LAB REPORT - SCANNED: Chlamydia, Swab/Urine, PCR: NEGATIVE

## 2024-03-27 ENCOUNTER — Ambulatory Visit

## 2024-03-28 ENCOUNTER — Other Ambulatory Visit (HOSPITAL_COMMUNITY)
Admission: RE | Admit: 2024-03-28 | Discharge: 2024-03-28 | Disposition: A | Discharge status: Home / Self Care | Source: Ambulatory Visit | Attending: Medical Genetics | Admitting: Medical Genetics

## 2024-04-07 LAB — GENECONNECT MOLECULAR SCREEN: Genetic Analysis Overall Interpretation: NEGATIVE

## 2024-05-22 ENCOUNTER — Ambulatory Visit (HOSPITAL_COMMUNITY): Admitting: Registered Nurse

## 2024-05-22 ENCOUNTER — Encounter (HOSPITAL_COMMUNITY): Payer: Self-pay

## 2024-06-11 ENCOUNTER — Encounter (HOSPITAL_COMMUNITY): Payer: Self-pay | Admitting: Registered Nurse

## 2024-06-11 ENCOUNTER — Ambulatory Visit (HOSPITAL_COMMUNITY): Admitting: Registered Nurse

## 2024-06-11 DIAGNOSIS — F411 Generalized anxiety disorder: Secondary | ICD-10-CM

## 2024-06-11 DIAGNOSIS — F410 Panic disorder [episodic paroxysmal anxiety] without agoraphobia: Secondary | ICD-10-CM | POA: Diagnosis not present

## 2024-06-11 DIAGNOSIS — F330 Major depressive disorder, recurrent, mild: Secondary | ICD-10-CM | POA: Diagnosis not present

## 2024-06-11 DIAGNOSIS — F5025 Bulimia nervosa, in remission: Secondary | ICD-10-CM

## 2024-06-11 DIAGNOSIS — R41840 Attention and concentration deficit: Secondary | ICD-10-CM

## 2024-06-11 MED ORDER — ATOMOXETINE HCL 60 MG PO CAPS: 60.0000 mg | ORAL_CAPSULE | Freq: Every day | ORAL | 1 refills | Status: AC

## 2024-06-11 MED ORDER — BUPROPION HCL ER (XL) 150 MG PO TB24: 150.0000 mg | ORAL_TABLET | Freq: Every day | ORAL | 1 refills | Status: AC

## 2024-06-11 NOTE — Patient Instructions (Addendum)
 If no one has contacted, you by the end of business day today please call the appropriate office listed below to schedule your next visit for medication management with Luisa Ruder, NP:    [facility] [address], [place], [place] [address]  5.4 mi Phone: [phone] (Call to schedule appointment)  [facility] [address] [place], [place] [address]  25 mi Phone: [phone] (Call to schedule appointment)  Routine Lab Work: Labs are ordered since it's been a while since your last tests. Labs should be checked at least yearly, or more frequently depending on prescribed medications. Routine blood work helps assess overall health and rule out non-psychiatric conditions. Important tests include: Complete Blood Count (CBC) with Differential/Platelet Comprehensive Metabolic Panel Hemoglobin A1c (diabetes screening) Magnesium  Ethanol Urinalysis (Routine with reflex microscopic, Clean Catch) Pregnancy test (urine, for females of childbearing age) POCT Urine Drug Screen (if prescribed any controlled substance) EKG Recommendations: An EKG is recommended before starting psychotropic medications and periodically thereafter to monitor for prolonged QTc, which can indicate cardiac risk factors. Some psychotropic medications can increase the risk of prolonged QTc. Have your primary care provider perform an EKG at your next visit and send the results if not available on Epic. Please ensure you have your labs drawn before your next scheduled visit.   The GeneSight Psychotropic test analyzes how your genes may affect your outcomes with medications commonly prescribed to treat depression, anxiety, ADHD, and other mental health conditions. The GeneSight Psychotropic test provides your clinician with information about which medications may require dose adjustments, may be less likely to work  for you or may have an increased risk of side effects based on your genetic makeup.     Understanding the GeneSight test algorithm The GeneSight test uses a unique combinatorial algorithm, which evaluates how variations in multiple genes may influence an individual's outcomes with certain medications. The algorithm provides important information regarding how an individual may metabolize and/or respond to certain medications commonly prescribed to treat depression, anxiety, ADHD and other psychiatric conditions. This information supplements a healthcare provider's comprehensive medical evaluation to help personalize treatment plans. The GeneSight Psychotropic test has been evaluated in multiple published, peer-reviewed clinical studies. It is the only neuropsychiatric pharmacogenomic test backed by such extensive research.  The role of genetics in predicting outcomes with medications used for psychiatric conditions Understanding the combinatorial approach of the GeneSight test starts with understanding our basic genetics: Each individual has a unique collection of genes responsible for their various genetic traits. The GeneSight test analyzes genetic variation found in 14 total genes that may impact a patient's outcomes with certain medications. This includes 9 pharmacokinetic genes and 5 pharmacodynamic genes: Pharmacokinetic (PK) genes are involved in how the body metabolizes, or breaks down, a particular medication through specific drug metabolizing enzymes. Pharmacodynamic (PD) genes provide information on how your DNA may impact your likelihood of response or risk for side effects with some medications. The GeneSight test takes into consideration all clinically relevant genes known to affect metabolism or response to a medication and assigns weights based on how big (or small) of a role it is expected to play in the medication's overall metabolism or mechanism of action.  The data that informs  the algorithm Evidence used to build the algorithm comes from comprehensive literature and data reviews. Sources include published scientific literature (collected from PubMed, Embase, [employer], etc.), approved regulatory drug labels,  and review applications (FDA). Animal-derived, preclinical data is not included - only human-based data. The data can be separated into three categories: FDA-reviewed pharmacokinetic (PK) and pharmacodynamic (PD) documents submitted as part of a new drug application (NDA). in-vitro (test tube) and in-vivo (human) data assessing medication pathways that have been published in peer-reviewed journals. This identifies the genes involved in the metabolism and/or the mechanism of action of a medication. in-vitro and in-vivo data evaluating genetic variations and applying expected activity levels (i.e., phenotype expression based on genetic variation) that have either been published in peer-reviewed journals or derived from internal lab data. This identifies to what extent the genetic variation is expected to impact the metabolism and/or the mechanism of action of a medication. The quality of the data is scrutinized, with higher quality evidence having a greater impact in the algorithm. For example, in-vivo data is held in higher regard than in-vitro data. Additionally, data assessed from evidence-grading bodies such as the Pharmacogenomics Knowledgebase (PharmGKB), the Clinical Pharmacogenetics Implementation Consortium RECRUITMENT CONSULTANT) and the [place] Pharmacogenetic Working Group [facility]) are also evaluated for inclusion. This ensures that all sources of clinically actionable gene-drug interactions [facility]) and genotype-phenotype relationships have been considered. It is important to note that our evaluation of the data may lead to different interpretations than what is concluded by individual expert PGx groups, evidence grading bodies, or other PGx companies. The field of pharmacogenomics is  constantly evolving. As the evidence surrounding the impact of established and newly identified genetic variants on medication metabolism and response evolves, so too will the algorithm. The [employer] team continually reviews available data to determine if and how it impacts the GeneSight proprietary algorithm. A full review of the GeneSight test is completed approximately every 18 months to 2 years.    For more information visit the GeneSight website:  https://genesight.com   ADHD Online Testing         Address:  [address]?[place], [place] [address] Phone: [phone] Fax: [phone] Email:  [email or web address]  ADHD Care in [place], [place] At [facility], we are dedicated to providing comprehensive ADHD treatment and management for patients of all ages. Our range of services is designed to address the unique needs of each individual, ensuring personalized and effective care.  ADHD Evaluation and Diagnosis Accurate diagnosis is the first step towards effective treatment. Our thorough evaluation process includes:   QbTest to objectively measure ADHD symptoms  Standardized ADHD rating scales  Behavioral assessments  Medical history review  Individualized Treatment Plans We believe in personalized care. Based on your evaluation, we develop a tailored treatment plan that may include:   Medication management  Lifestyle and dietary recommendations  Parent and family education  Social skills training  Organizational and time management strategies   Call 911, 988, mobile crisis, or present to the nearest emergency room should you experience any suicidal/homicidal ideation, auditory/visual/hallucinations, or detrimental worsening of your mental health.  Mobile Crisis Response Teams Listed by counties in vicinity of [facility] providers [facility] [phone] [facility]  [phone] [facility] [phone] [facility] [phone] [place]                * [facility] [phone]                * [facility] [phone]  [facility] [phone] [facility] [employer]  [phone] * [facility] [employer] [phone]
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
## 2024-06-19 ENCOUNTER — Telehealth (HOSPITAL_COMMUNITY): Payer: Self-pay

## 2024-06-19 NOTE — Telephone Encounter (Signed)
 Spoke with pt advised of shuvon's message she verbalized understanding

## 2024-06-19 NOTE — Telephone Encounter (Signed)
 Pt called in to let shuvon know that her pcp appt was push back until after her next appt with shuvon which is 08/04/24 so she won't have her labs or EKG done before next appt

## 2024-06-20 ENCOUNTER — Ambulatory Visit: Payer: Self-pay | Admitting: Family Medicine

## 2024-06-20 ENCOUNTER — Ambulatory Visit: Admitting: Family Medicine

## 2024-06-20 VITALS — BP 118/80 | HR 81 | Temp 98.0°F | Ht 62.0 in | Wt 121.0 lb

## 2024-06-20 DIAGNOSIS — Z79899 Other long term (current) drug therapy: Secondary | ICD-10-CM | POA: Diagnosis not present

## 2024-06-20 DIAGNOSIS — F411 Generalized anxiety disorder: Secondary | ICD-10-CM

## 2024-06-20 DIAGNOSIS — F4310 Post-traumatic stress disorder, unspecified: Secondary | ICD-10-CM

## 2024-06-20 DIAGNOSIS — F331 Major depressive disorder, recurrent, moderate: Secondary | ICD-10-CM

## 2024-06-20 DIAGNOSIS — F410 Panic disorder [episodic paroxysmal anxiety] without agoraphobia: Secondary | ICD-10-CM | POA: Diagnosis not present

## 2024-06-20 DIAGNOSIS — F5025 Bulimia nervosa, in remission: Secondary | ICD-10-CM

## 2024-06-20 LAB — URINALYSIS, ROUTINE W REFLEX MICROSCOPIC
Bilirubin, UA: NEGATIVE
Glucose, UA: NEGATIVE
Ketones, UA: NEGATIVE
Leukocytes,UA: NEGATIVE
Nitrite, UA: NEGATIVE
Protein,UA: NEGATIVE
RBC, UA: NEGATIVE
Specific Gravity, UA: 1.01 (ref 1.005–1.030)
Urobilinogen, Ur: 0.2 mg/dL (ref 0.2–1.0)
pH, UA: 7 (ref 5.0–7.5)

## 2024-06-20 LAB — PREGNANCY, URINE: Preg Test, Ur: NEGATIVE

## 2024-06-20 LAB — BAYER DCA HB A1C WAIVED: HB A1C (BAYER DCA - WAIVED): 4.7 % — ABNORMAL LOW (ref 4.8–5.6)

## 2024-06-20 NOTE — Progress Notes (Signed)
 Subjective:  Patient ID: Kristie Crawford, female    DOB: [birth year], 23 y.o.   MRN: [id number]  Patient Care Team: Severa Rock HERO, FNP as PCP - General (Family Medicine) Ray Jacques FORBES DEVONNA as Physician Assistant (Physician Assistant)   Chief Complaint:  Establish Care (Former Marry patient ) and Medical Management of Chronic Issues   HPI: Kristie Crawford is a 23 y.o. female presenting on 06/20/2024 for Establish Care (Former Marry patient ) and Medical Management of Chronic Issues   Kristie Crawford is a 23 year old female who presents for medication management and follow-up for anxiety and depression.  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  Allergies and medications reviewed and updated. Data reviewed: Chart in Epic.   Past Medical History:  Diagnosis Date   Alteration consciousness 10/07/2019   Anxiety 03/01/2022   Anxiety and depression    Depression, recurrent 03/01/2022   Facial abscess 01/02/2015   HELLP (hemolytic anemia/elev liver enzymes/low platelets in pregnancy) 03/31/2019    History of dental surgery-wisdom teeth extraction 01/03/2015   Labor and delivery, indication for care 03/29/2019   Ovarian cyst    Pregnancy induced hypertension    Pregnant 06/01/2023   PTSD (post-traumatic stress disorder)    Recurrent tonsillitis 09/2017   current antibiotic, will finish 09/25/2017   Seizure-like activity (HCC)    Suicide ideation 01/13/2022    Past Surgical History:  Procedure Laterality Date   CESAREAN SECTION N/A 03/31/2019   Procedure: CESAREAN SECTION;  Surgeon: Lenon Oneil FORBES, MD;  Location: [facility];  Service: Obstetrics;  Laterality: N/A;   TONSILLECTOMY N/A 10/01/2017   Procedure: TONSILLECTOMY;  Surgeon: Jesus Oliphant, MD;  Location: [facility];  Service: ENT;  Laterality: N/A;   TONSILLECTOMY     WISDOM TOOTH EXTRACTION  10/2014    Social History   Socioeconomic History   Marital status: Single    Spouse name: Not on file   Number of children: 1   Years of education: 12   Highest education level: GED or equivalent  Occupational History   Occupation: conservation officer, nature  Tobacco Use   Smoking status: Never   Smokeless tobacco: Never  Vaping Use   Vaping status: Never Used  Substance and Sexual Activity   Alcohol use: Not Currently    Comment: 1-2 on holidays; none since September 2023   Drug use: No    Comment: tried delta 8 vape once   Sexual activity: Yes    Birth control/protection: None  Other Topics Concern   Not on file  Social History Narrative   Lives at home with significant other and son.  Right-handed.   Occasional caffeine .   Social Drivers of Corporate Investment Banker Strain: Low Risk  (06/20/2024)   Overall Financial Resource Strain (CARDIA)    Difficulty of Paying Living Expenses: Not hard at all  Food Insecurity: No Food Insecurity (06/20/2024)   Hunger Vital Sign    Worried About Running Out of Food in the Last Year: Never true    Ran Out of Food in the Last Year: Never true  Transportation Needs: No  Transportation Needs (06/20/2024)   PRAPARE - Administrator, Civil Service (Medical): No    Lack of Transportation (Non-Medical): No  Physical Activity: Sufficiently Active (06/20/2024)   Exercise Vital Sign    Days of Exercise per Week: 7 days    Minutes of Exercise per Session: 150+ min  Stress: No Stress Concern Present (06/20/2024)   [employer] of Occupational Health - Occupational Stress Questionnaire    Feeling of Stress: Not at all  Social Connections: Moderately Integrated (06/20/2024)   Social Connection and Isolation Panel    Frequency of Communication with Friends and Family: More than three times a week    Frequency of Social Gatherings with Friends and Family: Once a week    Attends Religious Services: 1 to 4 times per year    Active Member of [employer] or Organizations: No    Attends Engineer, Structural: Not on file    Marital Status: Living with partner  Intimate Partner Violence: Unknown (11/15/2021)   Received from [facility]   HITS    Physically Hurt: Not on file    Insult or Talk Down To: Not on file    Threaten Physical Harm: Not on file    Scream or Curse: Not on file    Outpatient Encounter Medications as of 06/20/2024  Medication Sig   atomoxetine  (STRATTERA ) 60 MG capsule Take 1 capsule (60 mg total) by mouth daily.   buPROPion (WELLBUTRIN XL) 150 MG 24 hr tablet Take 1 tablet (150 mg total) by mouth daily.   No facility-administered encounter medications on file as of 06/20/2024.    Allergies  Allergen Reactions   Misoprostol  Rash    Pertinent ROS per HPI, otherwise unremarkable      Objective:  BP 118/80   Pulse 81   Temp 98 F (36.7 C)   Ht 5' 2 (1.575 m)   Wt 121 lb (54.9 kg)   LMP 06/10/2024   SpO2 100%   Breastfeeding Unknown   BMI 22.13 kg/m    Wt Readings from Last 3 Encounters:  06/20/24 121 lb (54.9 kg)  01/23/24 125 lb (56.7 kg)  12/06/23 137 lb (62.1 kg)    Physical Exam Vitals and nursing note  reviewed.  Constitutional:      General: She is not in acute distress.    Appearance: Normal appearance. She is well-developed, well-groomed and normal weight. She is not ill-appearing, toxic-appearing or diaphoretic.  HENT:     Head: Normocephalic and atraumatic.     Jaw: There is normal jaw occlusion.     Right Ear: Hearing normal.     Left Ear: Hearing normal.     Nose: Nose normal.     Mouth/Throat:     Lips: Pink.     Mouth: Mucous membranes are moist.     Pharynx: Oropharynx is clear. Uvula midline.  Eyes:     General: Lids are normal.     Extraocular Movements: Extraocular movements intact.     Conjunctiva/sclera:  Conjunctivae normal.     Pupils: Pupils are equal, Crawford, and reactive to light.  Neck:     Thyroid : No thyroid  mass, thyromegaly or thyroid  tenderness.     Vascular: No carotid bruit or JVD.     Trachea: Trachea and phonation normal.  Cardiovascular:     Rate and Rhythm: Normal rate and regular rhythm.     Chest Wall: PMI is not displaced.     Pulses: Normal pulses.     Heart sounds: Normal heart sounds. No murmur heard.    No friction rub. No gallop.  Pulmonary:     Effort: Pulmonary effort is normal. No respiratory distress.     Breath sounds: Normal breath sounds. No wheezing.  Abdominal:     General: Bowel sounds are normal. There is no distension or abdominal bruit.     Palpations: Abdomen is soft. There is no hepatomegaly or splenomegaly.     Tenderness: There is no abdominal tenderness. There is no right CVA tenderness or left CVA tenderness.     Hernia: No hernia is present.  Musculoskeletal:        General: Normal range of motion.       Arms:     Cervical back: Normal range of motion and neck supple.     Right lower leg: No edema.     Left lower leg: No edema.  Lymphadenopathy:     Cervical: No cervical adenopathy.  Skin:    General: Skin is warm and dry.     Capillary Refill: Capillary refill takes less than 2 seconds.     Coloration: Skin  is not cyanotic, jaundiced or pale.     Findings: No rash.  Neurological:     General: No focal deficit present.     Mental Status: She is alert and oriented to person, place, and time.     Sensory: Sensation is intact.     Motor: Motor function is intact.     Coordination: Coordination is intact.     Gait: Gait is intact.     Deep Tendon Reflexes: Reflexes are normal and symmetric.  Psychiatric:        Attention and Perception: Attention and perception normal.        Mood and Affect: Mood and affect normal.        Speech: Speech normal.        Behavior: Behavior normal. Behavior is cooperative.        Thought Content: Thought content normal.        Cognition and Memory: Cognition and memory normal.        Judgment: Judgment normal.     Results for orders placed or performed during the hospital encounter of 03/28/24  GeneConnect Molecular Screen - Blood ([facility])   Collection Time: 03/28/24  2:51 PM  Result Value Ref Range   Genetic Analysis Overall Interpretation Negative    Genetic Disease Assessed      This is a screening test and does not detect all pathogenic or likely pathogenic variant(s) in the tested genes; diagnostic testing is recommended for individuals with a personal or family history of heart disease or hereditary cancer. [employer] Tier One  Population Screen is a screening test that analyzes 11 genes related to hereditary breast and ovarian cancer (HBOC) syndrome, Lynch syndrome, and familial hypercholesterolemia. This test only reports clinically significant pathogenic and likely  pathogenic variants but does not report variants of uncertain significance (VUS). In addition, analysis of the  PMS2 gene excludes exons 11-15, which overlap with a known pseudogene (PMS2CL).    Genetic Analysis Report      No pathogenic or likely pathogenic variants were detected in the genes analyzed by this test.Genetic test results should be interpreted in the context of an  individual's personal medical and family history. Alteration to medical management is NOT  recommended based solely on this result. Clinical correlation is advised.Additional Considerations- This is a screening test; individuals may still carry pathogenic or likely pathogenic variant(s) in the tested genes that are not detected by this test.-  For individuals at risk for these or other related conditions based on factors including personal or family history, diagnostic testing is recommended.- The absence of pathogenic or likely pathogenic variant(s) in the analyzed genes, while reassuring,  does not eliminate the possibility of a hereditary condition; there are other variants and genes associated with heart disease and hereditary cancer that are not included in this test.    Genes Tested See Notes    Disclaimer See Notes    Sequencing Location See Notes    Interpretation Methods and Limitations See Notes        Pertinent labs & imaging results that were available during my care of the patient were reviewed by me and considered in my medical decision making.  Assessment & Plan:  Shaneequa was seen today for establish care and medical management of chronic issues.  Diagnoses and all orders for this visit:  Major depressive disorder, recurrent episode, moderate (HCC) -     CBC with Differential/Platelet -     CMP14+EGFR -     Bayer DCA Hb A1c Waived -     Magnesium  -     Ethanol -     Urinalysis, Routine w reflex microscopic -     Pregnancy, urine -     TSH -     T4, Free -     Vitamin B12 -     Folate  High risk medication use -     CBC with Differential/Platelet -     CMP14+EGFR -     Bayer DCA Hb A1c Waived -     Magnesium  -     Ethanol -     Urinalysis, Routine w reflex microscopic -     Pregnancy, urine -     TSH -     T4, Free -     Vitamin B12 -     Folate  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 Amoxetine dose reduced to 60 mg. Lovotriene recently started. Adjusting to medication schedule with no significant side effects. Experiences fatigue and anxiety around 6 PM and early morning, possibly related to medication timing. - Continue amoxetine 60 mg at night. - Continue lovotriene. - Ordered labs as requested by behavioral health team, including thyroid  function tests. - Ordered urine and blood work.  History of bulimia nervosa Well-controlled. Previously diagnosed with anxiety-induced bulimia. No current issues or significant deficiencies related to eating disorder.  General Health Maintenance Routine health maintenance discussed. Blood pressure, heart rate, and weight are within normal limits. -  Scheduled annual physical exam in one year unless lab results indicate earlier follow-up.          Continue all other maintenance medications.  Follow up plan: Return in about 1 year (around 06/20/2025), or if symptoms worsen or fail to improve, for Annual Physical.   Continue healthy lifestyle choices, including diet (rich in fruits, vegetables, and lean proteins, and low in salt and simple carbohydrates) and exercise (at least 30 minutes of moderate physical activity daily).  Educational handout given for health maintenance   The above assessment and management plan was discussed with the patient. The patient verbalized understanding of and has agreed to the management plan. Patient is aware to call the clinic if they develop any new symptoms or if symptoms persist or worsen. Patient is aware when to return to the clinic for a follow-up visit. Patient educated on when it is appropriate to go to the emergency department.   Rosaline Bruns, FNP-C [facility] [phone]

## 2024-06-24 LAB — CMP14+EGFR
ALT: 13 IU/L (ref 0–32)
AST: 16 IU/L (ref 0–40)
Albumin: 4.7 g/dL (ref 4.0–5.0)
Alkaline Phosphatase: 49 IU/L (ref 41–116)
BUN/Creatinine Ratio: 14 (ref 9–23)
BUN: 10 mg/dL (ref 6–20)
Bilirubin Total: 1.3 mg/dL — ABNORMAL HIGH (ref 0.0–1.2)
CO2: 23 mmol/L (ref 20–29)
Calcium: 9.7 mg/dL (ref 8.7–10.2)
Chloride: 100 mmol/L (ref 96–106)
Creatinine, Ser: 0.74 mg/dL (ref 0.57–1.00)
Globulin, Total: 2.5 g/dL (ref 1.5–4.5)
Glucose: 82 mg/dL (ref 70–99)
Potassium: 4.7 mmol/L (ref 3.5–5.2)
Sodium: 137 mmol/L (ref 134–144)
Total Protein: 7.2 g/dL (ref 6.0–8.5)
eGFR: 115 mL/min/1.73 (ref >59)

## 2024-06-24 LAB — CBC WITH DIFFERENTIAL/PLATELET
Basophils Absolute: 0 x10E3/uL (ref 0.0–0.2)
Basos: 1 %
EOS (ABSOLUTE): 0.1 x10E3/uL (ref 0.0–0.4)
Eos: 2 %
Hematocrit: 37.5 % (ref 34.0–46.6)
Hemoglobin: 12 g/dL (ref 11.1–15.9)
Immature Grans (Abs): 0 x10E3/uL (ref 0.0–0.1)
Immature Granulocytes: 0 %
Lymphocytes Absolute: 2 x10E3/uL (ref 0.7–3.1)
Lymphs: 43 %
MCH: 27.3 pg (ref 26.6–33.0)
MCHC: 32 g/dL (ref 31.5–35.7)
MCV: 85 fL (ref 79–97)
Monocytes Absolute: 0.4 x10E3/uL (ref 0.1–0.9)
Monocytes: 9 %
Neutrophils Absolute: 2.1 x10E3/uL (ref 1.4–7.0)
Neutrophils: 45 %
Platelets: 232 x10E3/uL (ref 150–450)
RBC: 4.39 x10E6/uL (ref 3.77–5.28)
RDW: 14.5 % (ref 11.7–15.4)
WBC: 4.6 x10E3/uL (ref 3.4–10.8)

## 2024-06-24 LAB — TSH: TSH: 1.32 u[IU]/mL (ref 0.450–4.500)

## 2024-06-24 LAB — VITAMIN B12: Vitamin B-12: 482 pg/mL (ref 232–1245)

## 2024-06-24 LAB — MAGNESIUM: Magnesium: 2.1 mg/dL (ref 1.6–2.3)

## 2024-06-24 LAB — T4, FREE: Free T4: 1.4 ng/dL (ref 0.82–1.77)

## 2024-06-24 LAB — FOLATE: Folate: 17.2 ng/mL (ref >3.0)

## 2024-06-24 LAB — ETHANOL

## 2024-06-25 DIAGNOSIS — A6004 Herpesviral vulvovaginitis: Secondary | ICD-10-CM | POA: Diagnosis not present

## 2024-06-25 DIAGNOSIS — Z113 Encounter for screening for infections with a predominantly sexual mode of transmission: Secondary | ICD-10-CM | POA: Diagnosis not present

## 2024-06-27 ENCOUNTER — Encounter: Payer: Self-pay | Admitting: Family Medicine

## 2024-06-30 ENCOUNTER — Other Ambulatory Visit: Payer: Self-pay | Admitting: Family Medicine

## 2024-06-30 DIAGNOSIS — B009 Herpesviral infection, unspecified: Secondary | ICD-10-CM

## 2024-06-30 MED ORDER — VALACYCLOVIR HCL 1 G PO TABS: 1000.0000 mg | ORAL_TABLET | Freq: Every day | ORAL | 0 refills | Status: AC

## 2024-06-30 NOTE — Progress Notes (Signed)
 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 Will treat with suppressive therapy with Valtrex 1 gm daily.

## 2024-07-02 ENCOUNTER — Telehealth (HOSPITAL_COMMUNITY): Payer: Self-pay

## 2024-07-02 NOTE — Telephone Encounter (Signed)
 Pt called in stating that she accidentally took her Wellbutrin  twice today. Pt took first dose around 8 this morning and second dose around 10. Pt states that her heart rate is around 110 and her chest is feeling tight. Spoke with shawn since he was in our office today and he advised that pt not take medication tomorrow and start back Friday and also if she has any worsening chest pain or if her heart rate is over 120 to go to ER. Shuvon is there anything else that I need to discuss with pt? Please advise.

## 2024-07-23 ENCOUNTER — Encounter: Payer: Self-pay | Admitting: Family Medicine

## 2024-08-04 ENCOUNTER — Ambulatory Visit (HOSPITAL_COMMUNITY): Admitting: Registered Nurse

## 2024-08-05 DIAGNOSIS — F332 Major depressive disorder, recurrent severe without psychotic features: Secondary | ICD-10-CM | POA: Diagnosis not present

## 2024-08-05 DIAGNOSIS — F902 Attention-deficit hyperactivity disorder, combined type: Secondary | ICD-10-CM | POA: Diagnosis not present

## 2024-08-05 DIAGNOSIS — F410 Panic disorder [episodic paroxysmal anxiety] without agoraphobia: Secondary | ICD-10-CM | POA: Diagnosis not present

## 2024-08-10 ENCOUNTER — Ambulatory Visit

## 2024-08-10 ENCOUNTER — Other Ambulatory Visit: Payer: Self-pay

## 2024-08-10 ENCOUNTER — Emergency Department (HOSPITAL_COMMUNITY)
Admission: EM | Admit: 2024-08-10 | Discharge: 2024-08-10 | Disposition: A | Discharge status: Home / Self Care | Attending: Emergency Medicine | Admitting: Emergency Medicine

## 2024-08-10 DIAGNOSIS — R10A0 Flank pain, unspecified side: Secondary | ICD-10-CM | POA: Diagnosis not present

## 2024-08-10 DIAGNOSIS — N3001 Acute cystitis with hematuria: Secondary | ICD-10-CM | POA: Insufficient documentation

## 2024-08-10 LAB — URINALYSIS, ROUTINE W REFLEX MICROSCOPIC
Bacteria, UA: NONE SEEN
Bilirubin Urine: NEGATIVE
Glucose, UA: NEGATIVE mg/dL
Hgb urine dipstick: NEGATIVE
Ketones, ur: NEGATIVE mg/dL
Leukocytes,Ua: NEGATIVE
Nitrite: POSITIVE — AB
Protein, ur: NEGATIVE mg/dL
Specific Gravity, Urine: 1.001 — ABNORMAL LOW (ref 1.005–1.030)
pH: 6 (ref 5.0–8.0)

## 2024-08-10 LAB — PREGNANCY, URINE: Preg Test, Ur: NEGATIVE

## 2024-08-10 MED ORDER — CEPHALEXIN 500 MG PO CAPS: 500.0000 mg | ORAL_CAPSULE | Freq: Two times a day (BID) | ORAL | 0 refills | Status: AC

## 2024-08-10 NOTE — Discharge Instructions (Addendum)
 Make sure you are drinking plenty of fluids to help flush out this infection.  Get rechecked for any worsening symptoms or if you develop fever, vomiting, all symptoms that might suggest that antibiotics are not working for this infection.  I have ordered a urine culture.  This result may help your primary provider if the keflex  is not working.

## 2024-08-10 NOTE — ED Triage Notes (Signed)
 Pt c/o right flank pain with burning sensation during urination x 3 days.

## 2024-08-10 NOTE — ED Provider Notes (Signed)
 " [place] [facility] AT [facility] Provider Note   CSN: [id number] Arrival date & time: 08/10/24  9178     Patient presents with: Flank Pain   Kristie Crawford is a 23 y.o. female presenting with a 3-day history of dysuria described as midline suprapubic and low back discomfort, painful urination but also describes reduced frequency of urination which is typical of her frequent UTIs.  She was last treated for UTI about 2 months ago with a course of Keflex .  She denies fevers or chills, she has had some mild nausea, denies vomiting, she is using Azo which has somewhat improved her symptoms.  Denies flank pain.   The history is provided by the patient.       Prior to Admission medications  Medication Sig Start Date End Date Taking? Authorizing Provider  cephALEXin  (KEFLEX ) 500 MG capsule Take 1 capsule (500 mg total) by mouth 2 (two) times daily for 7 days. 08/10/24 08/17/24 Yes Pattye Meda, PA-C  atomoxetine  (STRATTERA ) 60 MG capsule Take 1 capsule (60 mg total) by mouth daily. 06/11/24   Rankin, Shuvon B, NP  buPROPion  (WELLBUTRIN  XL) 150 MG 24 hr tablet Take 1 tablet (150 mg total) by mouth daily. 06/11/24 06/11/25  Rankin, Shuvon B, NP  valACYclovir  (VALTREX ) 1000 MG tablet Take 1 tablet (1,000 mg total) by mouth daily. 06/30/24   Severa Rock HERO, FNP    Allergies: Misoprostol     Review of Systems  Constitutional:  Negative for chills and fever.  HENT:  Negative for congestion and sore throat.   Eyes: Negative.   Respiratory:  Negative for chest tightness and shortness of breath.   Cardiovascular:  Negative for chest pain.  Gastrointestinal:  Negative for abdominal pain and nausea.  Genitourinary:  Positive for dysuria, frequency and urgency. Negative for flank pain and hematuria.  Musculoskeletal:  Positive for back pain. Negative for arthralgias, joint swelling and neck pain.  Skin: Negative.  Negative for rash and wound.  Neurological:  Negative for  dizziness, weakness, light-headedness, numbness and headaches.  Psychiatric/Behavioral: Negative.      Updated Vital Signs BP (!) 135/97   Pulse 96   Temp 98 F (36.7 C) (Oral)   Resp 17   Ht 5' 2 (1.575 m)   Wt 53.1 kg   LMP 07/11/2024 (Exact Date)   SpO2 100%   BMI 21.40 kg/m   Physical Exam Vitals and nursing note reviewed.  Constitutional:      Appearance: She is well-developed.  HENT:     Head: Normocephalic and atraumatic.  Eyes:     Conjunctiva/sclera: Conjunctivae normal.  Cardiovascular:     Rate and Rhythm: Normal rate and regular rhythm.     Heart sounds: Normal heart sounds.  Pulmonary:     Effort: Pulmonary effort is normal.     Breath sounds: Normal breath sounds. No wheezing.  Abdominal:     General: Bowel sounds are normal.     Palpations: Abdomen is soft.     Tenderness: There is no abdominal tenderness. There is no right CVA tenderness or left CVA tenderness.     Comments: Suprapubic discomfort, no guarding or distention  Musculoskeletal:        General: Normal range of motion.     Cervical back: Normal range of motion.     Lumbar back: Tenderness present.     Comments: Low back pain discomfort midline but not worsened by palpation.  Skin:    General: Skin is warm  and dry.  Neurological:     Mental Status: She is alert.     (all labs ordered are listed, but only abnormal results are displayed) Labs Reviewed  URINALYSIS, ROUTINE W REFLEX MICROSCOPIC - Abnormal; Notable for the following components:      Result Value   Color, Urine AMBER (*)    Specific Gravity, Urine 1.001 (*)    Nitrite POSITIVE (*)    All other components within normal limits  URINE CULTURE  PREGNANCY, URINE    EKG: None  Radiology: No results found.   Procedures   Medications Ordered in the ED - No data to display                                  Medical Decision Making Patient presenting with symptoms classic for acute cystitis.  She has taken Azo with  some symptom relief, her urine today is nitrite positive with the dip negative, however given her symptoms and possibility that the Azo is given a false dip result I will cover her with Keflex .  A urine culture has been ordered as she endorses frequent UTIs, I do not see a prior culture completed for her.  She was advised repeat lab visit with her PCP after antibiotics are completed, return precautions were outlined.  She is stable at time of discharge.  Amount and/or Complexity of Data Reviewed External Data Reviewed: labs.    Details: Prior outside labs reviewed, no recent urine culture found. Labs: ordered.    Details: Urinalysis nitrite positive, amber color secondary to Azo contamination.  Risk Prescription drug management.        Final diagnoses:  Acute cystitis with hematuria    ED Discharge Orders          Ordered    cephALEXin  (KEFLEX ) 500 MG capsule  2 times daily        08/10/24 1026               Chyla Schlender, PA-C 08/10/24 1036  "

## 2024-08-11 LAB — URINE CULTURE: Culture: NO GROWTH

## 2024-08-18 ENCOUNTER — Telehealth (HOSPITAL_COMMUNITY): Admitting: Registered Nurse

## 2024-08-18 ENCOUNTER — Encounter (HOSPITAL_COMMUNITY): Payer: Self-pay

## 2024-08-18 ENCOUNTER — Encounter: Admitting: Nurse Practitioner

## 2024-10-01 ENCOUNTER — Ambulatory Visit: Admitting: Family Medicine
